# Patient Record
Sex: Male | Born: 1940 | Race: White | Hispanic: No | Marital: Married | Smoking: Never smoker
Health system: Southern US, Community
[De-identification: ages and names within clinical notes are randomized; demographics above are authoritative.]

## PROBLEM LIST (undated history)

## (undated) DIAGNOSIS — K219 Gastro-esophageal reflux disease without esophagitis: Secondary | ICD-10-CM

## (undated) DIAGNOSIS — F0390 Unspecified dementia without behavioral disturbance: Secondary | ICD-10-CM

## (undated) DIAGNOSIS — G20 Parkinson's disease: Secondary | ICD-10-CM

## (undated) DIAGNOSIS — N400 Enlarged prostate without lower urinary tract symptoms: Secondary | ICD-10-CM

## (undated) DIAGNOSIS — G20A1 Parkinson's disease without dyskinesia, without mention of fluctuations: Secondary | ICD-10-CM

## (undated) HISTORY — PX: TONSILLECTOMY: SUR1361

---

## 2009-02-18 ENCOUNTER — Emergency Department: Payer: Self-pay | Admitting: Emergency Medicine

## 2009-02-19 ENCOUNTER — Ambulatory Visit: Payer: Self-pay | Admitting: Neurology

## 2014-12-01 ENCOUNTER — Ambulatory Visit: Payer: Self-pay | Admitting: Gastroenterology

## 2017-09-20 ENCOUNTER — Other Ambulatory Visit: Payer: Self-pay | Admitting: Ophthalmology

## 2017-09-20 DIAGNOSIS — H532 Diplopia: Secondary | ICD-10-CM

## 2017-09-25 ENCOUNTER — Ambulatory Visit
Admission: RE | Admit: 2017-09-25 | Discharge: 2017-09-25 | Disposition: A | Discharge status: Home / Self Care | Payer: Medicare Other | Source: Ambulatory Visit | Attending: Ophthalmology | Admitting: Ophthalmology

## 2017-09-25 DIAGNOSIS — H532 Diplopia: Secondary | ICD-10-CM | POA: Insufficient documentation

## 2017-09-25 MED ORDER — GADOBENATE DIMEGLUMINE 529 MG/ML IV SOLN
15.0000 mL | Freq: Once | INTRAVENOUS | Status: AC | PRN
  Administered 2017-09-25: 14 mL via INTRAVENOUS

## 2019-07-24 ENCOUNTER — Encounter: Payer: Self-pay | Admitting: Speech Pathology

## 2019-07-24 ENCOUNTER — Ambulatory Visit: Payer: Medicare Other | Attending: Neurology | Admitting: Speech Pathology

## 2019-07-24 ENCOUNTER — Other Ambulatory Visit: Payer: Self-pay

## 2019-07-24 DIAGNOSIS — R490 Dysphonia: Secondary | ICD-10-CM | POA: Diagnosis present

## 2019-07-24 DIAGNOSIS — M542 Cervicalgia: Secondary | ICD-10-CM | POA: Insufficient documentation

## 2019-07-24 DIAGNOSIS — M6281 Muscle weakness (generalized): Secondary | ICD-10-CM | POA: Insufficient documentation

## 2019-07-24 DIAGNOSIS — G8929 Other chronic pain: Secondary | ICD-10-CM | POA: Diagnosis present

## 2019-07-24 NOTE — Therapy (Signed)
[place] [facility] MAIN [facility] SERVICES [address] [place], [place], [address] Phone: [phone]   Fax:  [phone]  Speech Language Pathology Evaluation  Patient Details  Name: Joshua George MRN: [id number] Date of Birth: Feb 05, 1941 Referring Provider (SLP): Dr. Sherryll Burger   Encounter Date: 07/24/2019  End of Session - 07/24/19 1058    Visit Number  1    Number of Visits  17    Date for SLP Re-Evaluation  09/18/19    Authorization Type  Medicare    Authorization Time Period  Start 07/24/2019    Authorization - Visit Number  1    Authorization - Number of Visits  10    SLP Start Time  0900    SLP Stop Time   0950    SLP Time Calculation (min)  50 min    Activity Tolerance  Patient tolerated treatment well       History reviewed. No pertinent past medical history.  History reviewed. No pertinent surgical history.  There were no vitals filed for this visit.      SLP Evaluation [facility] - 07/24/19 0001      SLP Visit Information   SLP Received On  07/24/19    Referring Provider (SLP)  Dr. Sherryll Burger    Onset Date  06/12/2019    Medical Diagnosis  Parkinson's disease      Subjective   Subjective  Patient reports that he has been hoarse since the Parkinson's disease was diagnosed and that many people indicate that he is not loud enough.    Patient/Family Stated Goal  To be loud and clear when speaking      General Information   HPI  78 year old man diagnosed with Parkinson's disease with subsequent dysphonia and hypophonia      Prior Functional Status   Cognitive/Linguistic Baseline  Within functional limits      Oral Motor/Sensory Function   Overall Oral Motor/Sensory Function  Appears within functional limits for tasks assessed      Motor Speech   Overall Motor Speech  Impaired    Respiration  Impaired    Level of Impairment  Sentence    Phonation  Low vocal intensity;Hoarse    Resonance  Within functional limits    Articulation  Within  functional limitis    Intelligibility  Intelligible    Phonation  Impaired    Vocal Abuses  Habitual Hyperphonia    Tension Present  Jaw;Neck;Shoulder    Volume  Soft    Pitch  Low      Standardized Assessments   Standardized Assessments   Other Assessment   Perceptual Voice Evaluation      Perceptual Voice Evaluation Maximum phonation time for sustained "ah": 5 seconds Mean intensity during sustained "ah": 70 dB  Mean intensity sustained during conversational speech: 65 dB Habitual pitch: 106 Hz Highest dynamic pitch when altering pitch from a low note to a high note: 254 Hz Highest pitch during conversational speech: 381 Hz Lowest dynamic pitch when altering from a high note to a low note: 95 Hz Lowest pitch during conversational speech: 79 Hz Average fundamental frequency during sustained "ah": 107 Hz (1.6 below STD for age and gender) Visi-Pitch: Pharmacist, community (MDVP)  MDVPT extracts objective quantitative values (Relative Average Perturbation, Shimmer, Voice Turbulence Index, and Noise to Harmonic Ratio) on sustained phonation, which are displayed graphically and numerically in comparison to a built-in normative database.  The patient exhibited values outside the norm for Relative Average Perturbation  and Shimmer.  Average fundamental frequency was 1.6 below STD age and gender.  Simulatability: Improved vocal quality with trial flow phonation therapy.  SLP Education - 07/24/19 1057    Education Details  Results and recommendations    Person(s) Educated  Patient    Methods  Explanation    Comprehension  Verbalized understanding         SLP Long Term Goals - 07/24/19 1101      SLP LONG TERM GOAL #1   Title  The patient will demonstrate independent understanding of vocal hygiene concepts and extrinsic laryngeal muscle stretches.    Time  8    Period  Weeks    Status  New    Target Date  09/18/19      SLP LONG TERM GOAL #2   Title  The patient will be  independent for abdominal breathing and breath support exercises.    Time  8    Period  Weeks    Status  New    Target Date  09/18/19      SLP LONG TERM GOAL #3   Title  The patient will maximize voice quality and loudness using breath support/oral resonance for sustained vowel production, pitch glides, and hierarchal speech drill.    Time  8    Period  Weeks    Status  New    Target Date  09/18/19      SLP LONG TERM GOAL #4   Title  The patient will maximize voice quality and loudness using breath support/oral resonance for paragraph length recitation with 80% accuracy.    Time  8    Period  Weeks    Status  New    Target Date  09/18/19       Plan - 07/24/19 1100    Clinical Impression Statement  This 78 year old man with Parkinson's disease is presenting with moderate dysphonia.  The patient demonstrates hoarse vocal quality, reduced breath control for speech, excessive pharyngeal resonance, strained/tense phonation, limited pitch range, vocal fatigue, and laryngeal tension. He will benefit from voice therapy for education, to improve breath support, improve tone focus, promote easy flow phonation, and learn techniques to increase loudness and pitch range without strain.    Speech Therapy Frequency  2x / week    Duration  Other (comment)   8 weeks   Treatment/Interventions  Other (comment);Patient/family education   Voice therapy   Potential to Achieve Goals  Good    Potential Considerations  Ability to learn/carryover information;Previous level of function;Co-morbidities;Severity of impairments;Cooperation/participation level;Medical prognosis;Family/community support    SLP Home Exercise Plan  Provided    Consulted and Agree with Plan of Care  Patient       Patient will benefit from skilled therapeutic intervention in order to improve the following deficits and impairments:   Dysphonia - Plan: SLP plan of care cert/re-cert    Problem List There are no active problems to  display for this patient.  Dollene Primrose, MS/CCC- SLP  Leandrew Koyanagi 07/24/2019, 11:07 AM  [place] [facility] MAIN [facility] SERVICES [address] [place], [place], [address] Phone: [phone]   Fax:  [phone]  Name: Aayden Salvati MRN: [id number] Date of Birth: 1940-11-23

## 2019-07-26 ENCOUNTER — Other Ambulatory Visit: Payer: Self-pay

## 2019-07-26 ENCOUNTER — Ambulatory Visit: Payer: Medicare Other | Admitting: Speech Pathology

## 2019-07-26 DIAGNOSIS — R490 Dysphonia: Secondary | ICD-10-CM | POA: Diagnosis not present

## 2019-07-26 NOTE — Therapy (Signed)
[place] [facility] MAIN [facility] SERVICES [address] [place], [place], [address] Phone: [phone]   Fax:  [phone]  Speech Language Pathology Treatment  Patient Details  Name: Joshua George MRN: [id number] Date of Birth: Aug 13, 1941 Referring Provider (SLP): Dr. Sherryll Burger   Encounter Date: 07/26/2019  End of Session - 07/26/19 1551    Visit Number  1    Number of Visits  17    Date for SLP Re-Evaluation  09/18/19    Authorization Type  Medicare    Authorization Time Period  Start 07/24/2019    Authorization - Visit Number  2    Authorization - Number of Visits  10    SLP Start Time  0800    SLP Stop Time   0845    SLP Time Calculation (min)  45 min    Activity Tolerance  Patient tolerated treatment well       No past medical history on file.  No past surgical history on file.  There were no vitals filed for this visit.  Subjective Assessment - 07/26/19 1551    Subjective  The patient reports that he is doing the exercises provided at the evaluation            ADULT SLP TREATMENT - 07/26/19 0001      General Information   Behavior/Cognition  Alert;Cooperative;Pleasant mood    HPI  78 year old man diagnosed with Parkinson's disease with subsequent dysphonia and hypophonia       Treatment Provided   Treatment provided  Cognitive-Linquistic      Pain Assessment   Pain Assessment  No/denies pain      Cognitive-Linquistic Treatment   Treatment focused on  Voice    Skilled Treatment  Flow Phonation Skill Level One: establish airflow release: Unarticulated: Patient able to generate multiple samples of unarticulated airflow without stoppage due to tension.  Articulated: Patient had difficulty maintaining airflow with unvoiced articulated airflow.  By the end of this portion of the session, the patient was able to maintain articulated airflow for up to 7 syllables (inconsistent).  Skill Level Two: Airflow + Voicing: Unarticulated:  Patient  had inconsistent success with unarticulated voiced sounds, best response to gently turning on voice while transitioning from voiceless fricative to voiced fricative.      Assessment / Recommendations / Plan   Plan  Continue with current plan of care      Progression Toward Goals   Progression toward goals  Progressing toward goals       SLP Education - 07/26/19 1551    Education Details  flow phonation    Person(s) Educated  Patient    Methods  Explanation;Demonstration;Handout    Comprehension  Verbalized understanding;Need further instruction         SLP Long Term Goals - 07/24/19 1101      SLP LONG TERM GOAL #1   Title  The patient will demonstrate independent understanding of vocal hygiene concepts and extrinsic laryngeal muscle stretches.    Time  8    Period  Weeks    Status  New    Target Date  09/18/19      SLP LONG TERM GOAL #2   Title  The patient will be independent for abdominal breathing and breath support exercises.    Time  8    Period  Weeks    Status  New    Target Date  09/18/19      SLP LONG TERM GOAL #3  Title  The patient will maximize voice quality and loudness using breath support/oral resonance for sustained vowel production, pitch glides, and hierarchal speech drill.    Time  8    Period  Weeks    Status  New    Target Date  09/18/19      SLP LONG TERM GOAL #4   Title  The patient will maximize voice quality and loudness using breath support/oral resonance for paragraph length recitation with 80% accuracy.    Time  8    Period  Weeks    Status  New    Target Date  09/18/19       Plan - 07/26/19 1552    Clinical Impression Statement  The patient demonstrates understanding of flow phonation with good mastery of unarticulated/unvoiced airflow and emerging mastery of articulated/unvoiced and unarticulated/voiced sounds.    Speech Therapy Frequency  2x / week    Duration  Other (comment)    Treatment/Interventions  Other  (comment);Patient/family education    Potential Considerations  Ability to learn/carryover information;Previous level of function;Co-morbidities;Severity of impairments;Cooperation/participation level;Medical prognosis;Family/community support    SLP Home Exercise Plan  Provided    Consulted and Agree with Plan of Care  Patient       Patient will benefit from skilled therapeutic intervention in order to improve the following deficits and impairments:   Dysphonia    Problem List There are no active problems to display for this patient.  [name], MS/CCC- SLP  [name] 07/26/2019, 3:53 PM  [place] MAIN [facility] SERVICES [address] [place], [place], [address] Phone: [phone]   Fax:  [phone]   Name: Nikita Humble MRN: [id number] Date of Birth: 02-18-1941

## 2019-07-30 ENCOUNTER — Ambulatory Visit: Payer: Medicare Other | Admitting: Physical Therapy

## 2019-07-30 ENCOUNTER — Other Ambulatory Visit: Payer: Self-pay

## 2019-07-30 ENCOUNTER — Encounter: Payer: Self-pay | Admitting: Physical Therapy

## 2019-07-30 DIAGNOSIS — R490 Dysphonia: Secondary | ICD-10-CM | POA: Diagnosis not present

## 2019-07-30 DIAGNOSIS — M6281 Muscle weakness (generalized): Secondary | ICD-10-CM

## 2019-07-30 DIAGNOSIS — G8929 Other chronic pain: Secondary | ICD-10-CM

## 2019-07-30 NOTE — Patient Instructions (Addendum)
Flexion: Cervical OA - Supine    Lie with bolster under mid neck, fingertips under chin. Pull chin in then lower it, rolling head forward on neck. Do not lift neck off bolster. Repeat _10___ times per set. Do __2__ sets per session. Do ___7_ sessions per week.  Copyright  VHI. All rights reserved.  SCAPULA: Retraction    Hold cane with both hands. Pinch shoulder blades together. Do not shrug shoulders. Hold _5__ seconds.  __10_ reps per set, _2__ sets per day, __7_ days per week  Copyright  VHI. All rights reserved.

## 2019-07-30 NOTE — Therapy (Signed)
[place] [facility] MAIN [facility] SERVICES [address] [place], [place], [id number] Phone: [phone]   Fax:  [phone]  Physical Therapy Evaluation  Patient Details  Name: Joshua George MRN: [id number] Date of Birth: 02/28/1941 Referring Provider (PT): Cristopher PeruShah, Hemang   Encounter Date: 07/30/2019  PT End of Session - 07/30/19 0827    Visit Number  1    Number of Visits  17    Date for PT Re-Evaluation  09/30/19    PT Start Time  0801    PT Stop Time  0900    PT Time Calculation (min)  59 min    Equipment Utilized During Treatment  Gait belt    Activity Tolerance  Patient tolerated treatment well;No increased pain       History reviewed. No pertinent past medical history.  History reviewed. No pertinent surgical history.  There were no vitals filed for this visit.   Subjective Assessment - 07/30/19 0804    Subjective  Patient reports that he does not walk as well as he did before he had the parkinsons.Patient has poor posture and has neck flexion in sitting and fwd head. He has neck pain intermittently. His pain ranges from 0/10-3-4/10.    Pertinent History  Patient has low back pain for over a year that is constant. He does not ambulate with a device. He has had neck pain that is intermittent for over a year and ranges from 0/10 to 4/10.    Currently in Pain?  Yes    Pain Score  6     Pain Location  Back    Pain Orientation  Posterior;Lower    Pain Descriptors / Indicators  Aching    Pain Frequency  Constant    Multiple Pain Sites  Yes   he has intermittent neck pain , currenttly no neck pain        [facility] PT Assessment - 07/30/19 0808      Assessment   Medical Diagnosis  parkinsons    Referring Provider (PT)  Sherryll BurgerShah, Hemang    Onset Date/Surgical Date  07/24/19    Hand Dominance  Right    Prior Therapy  no      Precautions   Precautions  None      Restrictions   Weight Bearing Restrictions  No      Balance Screen   Has the  patient fallen in the past 6 months  No    Has the patient had a decrease in activity level because of a fear of falling?   Yes    Is the patient reluctant to leave their home because of a fear of falling?   No      Home Nurse, mental healthnvironment   Living Environment  Private residence    Living Arrangements  Spouse/significant other    Available Help at Discharge  Family    Type of Home  House    Home Access  Ramped entrance    Home Layout  One level    Home Equipment  Walker - 2 wheels;Gilmer MorCane - single point      Prior Function   Level of Independence  Independent with household mobility without device    Vocation  Retired    Leisure  work in the garden      Cognition   Overall Cognitive Status  Within Functional Limits for tasks assessed        PAIN: Neck pain intermittent pain 0/10-4/10  Palpation reveals no tenderness in  cervical or thoracic musculature  POSTURE:fwd head, flexed neck, kyphosis    PROM/AROM: Cervical rotation left 40 deg, right 40 deg Neck SBR= 30 deg SBL = 10 Neck extension -10 deg   Cervical strength: Extension 2+/5 Rotation left and right 3/5 SB L and R -3/5        SENSATION:WNL BUE   FUNCTIONAL MOBILITY: Patient is not able to get fully into prone position due to 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              PT Education - 07/30/19 0826    Education Details  plan of care    Person(s) Educated  Patient     Methods  Explanation    Comprehension  Verbalized understanding       PT Short Term Goals - 07/30/19 0902      PT SHORT TERM GOAL #1   Title  Patient will be independent in home exercise program to improve strength/mobility for better functional independence with ADLs.    Time  4    Period  Weeks    Status  New    Target Date  09/02/19        PT Long Term Goals - 07/30/19 0903      PT LONG TERM GOAL #1   Title  Patient will increase BLE gross strength to 4+/5 as to improve functional strength for independent gait, increased standing tolerance and increased ADL ability.    Time  8    Period  Weeks    Status  New    Target Date  09/24/19      PT LONG TERM GOAL #2   Title  Patient will be able to perform household work/ chores without increase in symptoms.    Time  8    Period  Weeks    Status  New    Target Date  09/24/19      PT LONG TERM GOAL #3   Title  Patient will report a worst pain of 2/10 on VAS in   neck          to improve tolerance with ADLs and reduced symptoms with activities.    Time  8    Status  New    Target Date  09/24/19             Plan - 07/30/19 7510    Clinical Impression Statement Pt is a pleasant patient with c/o intermittent neck pain, balance difficulty, and muscle weakness impairing ADL's.  Pt currently presents with significant posture and cervical ROM and strength deficits.  Pt would benefit from continued skilled PT to address these deficits and improve posture.    Personal Factors and Comorbidities  Age;Comorbidity 1    Comorbidities  Parkinsons    Examination-Activity Limitations  Caring for Others;Stand;Locomotion Level    Examination-Participation Restrictions  [employer]  Stability/Clinical Decision Making  Stable/Uncomplicated    Clinical Decision Making  Low    Rehab Potential  Fair    PT Frequency  2x / week    PT Duration  8 weeks    PT Treatment/Interventions  Cryotherapy;Electrical Stimulation;Moist  Heat;Traction;Ultrasound;Therapeutic activities;Therapeutic exercise;Patient/family education;Manual techniques;Passive range of motion    PT Next Visit Plan  posture education    PT Home Exercise Plan  chin tucks, scapula retraction    Consulted and Agree with Plan of Care  Patient       Patient will benefit from skilled therapeutic intervention in order to improve the following deficits and impairments:  Pain, Decreased activity tolerance, Decreased endurance, Decreased range of motion, Decreased strength  Visit Diagnosis: Muscle weakness (generalized)  Neck pain, chronic     Problem List There are no active problems to display for this patient.   [address] [place] DPT 07/30/2019, 9:07 AM  [place] [facility] MAIN [facility] SERVICES [address] [place], [place], [address] Phone: [phone]   Fax:  [phone]  Name: Joshua George MRN: [id number] Date of Birth: [birth year]

## 2019-08-01 ENCOUNTER — Encounter: Payer: Self-pay | Admitting: Speech Pathology

## 2019-08-01 ENCOUNTER — Ambulatory Visit: Payer: Medicare Other | Admitting: Speech Pathology

## 2019-08-01 ENCOUNTER — Other Ambulatory Visit: Payer: Self-pay

## 2019-08-01 DIAGNOSIS — R490 Dysphonia: Secondary | ICD-10-CM

## 2019-08-01 NOTE — Therapy (Signed)
[place] MAIN [facility] SERVICES [address] [place], [place], [address] Phone: [phone]   Fax:  [phone]  Speech Language Pathology Treatment  Patient Details  Name: Louie Flenner MRN: [id number] Date of Birth: 09-Oct-1940 Referring Provider (SLP): Dr. Manuella Ghazi   Encounter Date: 08/01/2019  End of Session - 08/01/19 1629    Visit Number  3    Number of Visits  17    Date for SLP Re-Evaluation  09/18/19    Authorization Type  Medicare    Authorization Time Period  Start 07/24/2019    Authorization - Visit Number  3    Authorization - Number of Visits  10    SLP Start Time  1400    SLP Stop Time   1450    SLP Time Calculation (min)  50 min    Activity Tolerance  Patient tolerated treatment well       History reviewed. No pertinent past medical history.  History reviewed. No pertinent surgical history.  There were no vitals filed for this visit.  Subjective Assessment - 08/01/19 1626    Subjective  The patient was alert, cooperative, and pleasant throughout the therapy session. He shared that one of his neck stretches was helpful today after working in the kitchen.              SLP Education - 08/01/19 1628    Education Details  Demonstration of improved positioning for extrinsic laryngeal muscle stretch.    Person(s) Educated  Patient    Methods  Explanation;Demonstration    Comprehension  Verbalized understanding;Returned demonstration         SLP Long Term Goals - 07/24/19 1101      SLP LONG TERM GOAL #1   Title  The patient will demonstrate independent understanding of vocal hygiene concepts and extrinsic laryngeal muscle stretches.    Time  8    Period  Weeks    Status  New    Target Date  09/18/19      SLP LONG TERM GOAL #2   Title  The patient will be independent for abdominal breathing and breath support exercises.    Time  8    Period  Weeks    Status  New    Target Date  09/18/19      SLP LONG TERM  GOAL #3   Title  The patient will maximize voice quality and loudness using breath support/oral resonance for sustained vowel production, pitch glides, and hierarchal speech drill.    Time  8    Period  Weeks    Status  New    Target Date  09/18/19      SLP LONG TERM GOAL #4   Title  The patient will maximize voice quality and loudness using breath support/oral resonance for paragraph length recitation with 80% accuracy.    Time  8    Period  Weeks    Status  New    Target Date  09/18/19       Plan - 08/01/19 1629    Clinical Impression Statement  Patient presents with moderate dysphonia characterized by hoarse vocal quality, reduced breath control for speech, excessive pharyngeal resonance, strained/tense phonation, limited pitch range, vocal fatigue, and laryngeal tension. The patient demonstrates understanding of flow phonation with good mastery of unarticulated/unvoiced airflow and emerging mastery of articulated/unvoiced and unarticulated/voiced sounds. Will continue ST intervention to address dysphonia.    Speech Therapy Frequency  2x / week  Duration  Other (comment)    Treatment/Interventions  Other (comment);Patient/family education    Potential to Achieve Goals  Good    Potential Considerations  Ability to learn/carryover information;Previous level of function;Co-morbidities;Severity of impairments;Cooperation/participation level;Medical prognosis;Family/community support    SLP Home Exercise Plan  Provided    Consulted and Agree with Plan of Care  Patient       Patient will benefit from skilled therapeutic intervention in order to improve the following deficits and impairments:   Dysphonia    Problem List There are no active problems to display for this patient.  Japleen Tornow A. Avelina Laine., Graduate Clinician Leandrew Koyanagi 08/02/2019, 11:21 AM  [place] [facility] MAIN [facility] SERVICES [address] [place], [place], [address] Phone:  [phone]   Fax:  [phone]   Name: Hilman Kissling MRN: [id number] Date of Birth: 1941/05/27

## 2019-08-05 ENCOUNTER — Ambulatory Visit: Payer: Medicare Other | Admitting: Physical Therapy

## 2019-08-05 ENCOUNTER — Other Ambulatory Visit: Payer: Self-pay

## 2019-08-05 ENCOUNTER — Encounter: Payer: Self-pay | Admitting: Physical Therapy

## 2019-08-05 DIAGNOSIS — R490 Dysphonia: Secondary | ICD-10-CM | POA: Diagnosis not present

## 2019-08-05 DIAGNOSIS — M6281 Muscle weakness (generalized): Secondary | ICD-10-CM

## 2019-08-05 DIAGNOSIS — G8929 Other chronic pain: Secondary | ICD-10-CM

## 2019-08-05 DIAGNOSIS — M542 Cervicalgia: Secondary | ICD-10-CM

## 2019-08-05 NOTE — Therapy (Signed)
[place] MAIN [facility] SERVICES [address] [place], [place], [address] Phone: [phone]   Fax:  [phone]  Physical Therapy Treatment  Patient Details  Name: Joshua George MRN: [id number] Date of Birth: Apr 11, 1941 Referring Provider (PT): Jennings Books   Encounter Date: 08/05/2019  PT End of Session - 08/05/19 0810    Visit Number  2    Number of Visits  17    Date for PT Re-Evaluation  09/30/19    PT Start Time  0805    PT Stop Time  2119    PT Time Calculation (min)  39 min    Equipment Utilized During Treatment  Gait belt    Activity Tolerance  Patient tolerated treatment well;No increased pain       History reviewed. No pertinent past medical history.  History reviewed. No pertinent surgical history.  There were no vitals filed for this visit.  Subjective Assessment - 08/05/19 0809    Subjective  Patient says that he wants to work on his posture.    Pertinent History  Patient has low back pain for over a year that is constant. He does not ambulate with a device. He has had neck pain that is intermittent for over a year and ranges from 0/10 to 4/10.      Treatment UBE fwd x 5 mins, bwd x 5 mins Scapula retraction RTB x 20 x 2 Chin tucks with 5 sec hold sitting  Matrix scapula retraction x 20 x 2  Protraction with RTB x 20 x 2  Over the door with green ball x 10  Supine protraction with cane and 2 lbs x 20 x 2  Patient performed with instruction, verbal cues, tactile cues of therapist: goal: increase tissue extensibility, promote proper posture, improve mobility                         PT Education - 08/05/19 0809    Education Details  HEP    Person(s) Educated  Patient    Methods  Explanation    Comprehension  Verbalized understanding;Need further instruction;Returned demonstration;Verbal cues required;Tactile cues required       PT Short Term Goals - 07/30/19 0902      PT SHORT TERM GOAL #1    Title  Patient will be independent in home exercise program to improve strength/mobility for better functional independence with ADLs.    Time  4    Period  Weeks    Status  New    Target Date  09/02/19        PT Long Term Goals - 07/30/19 0903      PT LONG TERM GOAL #1   Title  Patient will increase BLE gross strength to 4+/5 as to improve functional strength for independent gait, increased standing tolerance and increased ADL ability.    Time  8    Period  Weeks    Status  New    Target Date  09/24/19      PT LONG TERM GOAL #2   Title  Patient will be able to perform household work/ chores without increase in symptoms.    Time  8    Period  Weeks    Status  New    Target Date  09/24/19      PT LONG TERM GOAL #3   Title  Patient will report a worst pain of 2/10 on VAS in   neck  to improve tolerance with ADLs and reduced symptoms with activities.    Time  8    Status  New    Target Date  09/24/19            Plan - 08/05/19 0810    Clinical Impression Statement  Patient presents with no neck/cervical pain today. He performs upper thoracic and cervical strengthening exercises to improve posture. He performs exercises without pain behaviors and needs cues and instruction for correct technique. Patient will continue to benefit from skilled PT to improve posture and strength.    Personal Factors and Comorbidities  Age;Comorbidity 1    Comorbidities  Parkinsons    Examination-Activity Limitations  Caring for Others;Stand;Locomotion Level    Examination-Participation Restrictions  Yard Work;Driving    Stability/Clinical Decision Making  Stable/Uncomplicated    Rehab Potential  Fair    PT Frequency  2x / week    PT Duration  8 weeks    PT Treatment/Interventions  Cryotherapy;Electrical Stimulation;Moist Heat;Traction;Ultrasound;Therapeutic activities;Therapeutic exercise;Patient/family education;Manual techniques;Passive range of motion    PT Next Visit Plan   posture education    PT Home Exercise Plan  chin tucks, scapula retraction    Consulted and Agree with Plan of Care  Patient       Patient will benefit from skilled therapeutic intervention in order to improve the following deficits and impairments:  Pain, Decreased activity tolerance, Decreased endurance, Decreased range of motion, Decreased strength  Visit Diagnosis: Muscle weakness (generalized)  Neck pain, chronic     Problem List There are no active problems to display for this patient.   [address],  DPT 08/05/2019, 8:16 AM   [facility] MAIN [facility] SERVICES [address] [place], [place], [address] Phone: [phone]   Fax:  [phone]  Name: Joshua George MRN: [id number] Date of Birth: 06-12-1941

## 2019-08-06 ENCOUNTER — Ambulatory Visit: Payer: Medicare Other | Admitting: Speech Pathology

## 2019-08-06 ENCOUNTER — Ambulatory Visit: Payer: Medicare Other | Admitting: Physical Therapy

## 2019-08-06 ENCOUNTER — Encounter: Payer: Self-pay | Admitting: Speech Pathology

## 2019-08-06 ENCOUNTER — Encounter: Payer: Self-pay | Admitting: Physical Therapy

## 2019-08-06 ENCOUNTER — Other Ambulatory Visit: Payer: Self-pay

## 2019-08-06 DIAGNOSIS — G8929 Other chronic pain: Secondary | ICD-10-CM

## 2019-08-06 DIAGNOSIS — M6281 Muscle weakness (generalized): Secondary | ICD-10-CM

## 2019-08-06 DIAGNOSIS — R490 Dysphonia: Secondary | ICD-10-CM | POA: Diagnosis not present

## 2019-08-06 NOTE — Therapy (Signed)
[place] [facility] MAIN [facility] SERVICES [address] [place], [place], [address] Phone: [phone]   Fax:  [phone]  Physical Therapy Treatment  Patient Details  Name: Joshua George MRN: [id number] Date of Birth: 12/15/1940 Referring Provider (PT): Cristopher Peru   Encounter Date: 08/06/2019  PT End of Session - 08/06/19 0811    Visit Number  3    Number of Visits  17    Date for PT Re-Evaluation  09/30/19    PT Start Time  0805    PT Stop Time  0845    PT Time Calculation (min)  40 min    Equipment Utilized During Treatment  Gait belt    Activity Tolerance  Patient tolerated treatment well;No increased pain       History reviewed. No pertinent past medical history.  History reviewed. No pertinent surgical history.  There were no vitals filed for this visit.  Subjective Assessment - 08/06/19 0809    Subjective  Patient says that he wants to work on his posture.    Pertinent History  Patient has low back pain for over a year that is constant. He does not ambulate with a device. He has had neck pain that is intermittent for over a year and ranges from 0/10 to 4/10.    Currently in Pain?  Yes    Pain Score  1     Pain Location  Shoulder    Pain Orientation  Left    Pain Descriptors / Indicators  Aching    Pain Type  Acute pain    Pain Onset  Today    Pain Frequency  Rarely    Aggravating Factors   exercise    Pain Relieving Factors  rest    Effect of Pain on Daily Activities  no change    Multiple Pain Sites  No          Treatment UBE fwd x 5 mins, bwd x 5 mins Scapula retraction RTB x 20 x 2 Chin tucks with 5 sec hold sitting              Chin tucks with 5 sec hold, supine Matrix scapula retraction x 20 x 2  Protraction with RTB x 20 x 2  Over the door with green ball x 10  Supine protraction with cane and 2 lbs x 20 x 2  Seated protraction with RTB x 20  Matrix protraction with plate #1  Wall walking with RTB diagonal ladder  up the wall Supine cane horizontal abd/add x 20  Instructed in posture correction with keeping shoulders back and avoiding the head fwd position during the day . Patient performed with instruction, verbal cues, tactile cues of therapist: goal:increase tissue extensibility, promote proper posture, improve mobility                         PT Education - 08/06/19 0810    Education Details  HEP    Person(s) Educated  Patient    Methods  Explanation;Demonstration;Tactile cues;Verbal cues    Comprehension  Verbalized understanding;Returned demonstration;Verbal cues required;Tactile cues required;Need further instruction       PT Short Term Goals - 07/30/19 0902      PT SHORT TERM GOAL #1   Title  Patient will be independent in home exercise program to improve strength/mobility for better functional independence with ADLs.    Time  4    Period  Weeks  Status  New    Target Date  09/02/19        PT Long Term Goals - 07/30/19 0903      PT LONG TERM GOAL #1   Title  Patient will increase BLE gross strength to 4+/5 as to improve functional strength for independent gait, increased standing tolerance and increased ADL ability.    Time  8    Period  Weeks    Status  New    Target Date  09/24/19      PT LONG TERM GOAL #2   Title  Patient will be able to perform household work/ chores without increase in symptoms.    Time  8    Period  Weeks    Status  New    Target Date  09/24/19      PT LONG TERM GOAL #3   Title  Patient will report a worst pain of 2/10 on VAS in   neck          to improve tolerance with ADLs and reduced symptoms with activities.    Time  8    Status  New    Target Date  09/24/19            Plan - 08/06/19 4967    Clinical Impression Statement  Patient presents with no neck/cervical pain today. He performs upper thoracic and cervical strengthening exercises to improve posture. He performs exercises without pain behaviors and needs  cues and instruction for correct technique. Patient will continue to benefit from skilled PT to improve posture and strength    Personal Factors and Comorbidities  Age;Comorbidity 1    Comorbidities  Parkinsons    Examination-Activity Limitations  Caring for Others;Stand;Locomotion Level    Examination-Participation Restrictions  Yard Work;Driving    Stability/Clinical Decision Making  Stable/Uncomplicated    Rehab Potential  Fair    PT Frequency  2x / week    PT Duration  8 weeks    PT Treatment/Interventions  Cryotherapy;Electrical Stimulation;Moist Heat;Traction;Ultrasound;Therapeutic activities;Therapeutic exercise;Patient/family education;Manual techniques;Passive range of motion    PT Next Visit Plan  posture education    PT Home Exercise Plan  chin tucks, scapula retraction    Consulted and Agree with Plan of Care  Patient       Patient will benefit from skilled therapeutic intervention in order to improve the following deficits and impairments:  Pain, Decreased activity tolerance, Decreased endurance, Decreased range of motion, Decreased strength  Visit Diagnosis: Muscle weakness (generalized)  Neck pain, chronic     Problem List There are no active problems to display for this patient.   [address] [place] DPT 08/06/2019, 8:16 AM  [place] MAIN [facility] SERVICES [place], [place], [address] Phone: [phone]   Fax:  [phone]  Name: Joshua George MRN: [id number] Date of Birth: Jan 06, 1941

## 2019-08-06 NOTE — Therapy (Signed)
[place] [facility] MAIN [facility] SERVICES [address] [place], [place], [address] Phone: [phone]   Fax:  [phone]  Speech Language Pathology Treatment  Patient Details  Name: Joshua George MRN: [id number] Date of Birth: 11/16/1940 Referring Provider (SLP): Dr. Sherryll Burger   Encounter Date: 08/06/2019  End of Session - 08/06/19 1115    Visit Number  4    Number of Visits  17    Date for SLP Re-Evaluation  09/18/19    Authorization Type  Medicare    Authorization Time Period  Start 07/24/2019    Authorization - Visit Number  4    Authorization - Number of Visits  10    SLP Start Time  0900    SLP Stop Time   0950    SLP Time Calculation (min)  50 min    Activity Tolerance  Patient tolerated treatment well       History reviewed. No pertinent past medical history.  History reviewed. No pertinent surgical history.  There were no vitals filed for this visit.  Subjective Assessment - 08/06/19 1113    Subjective  The patient was alert, cooperative, and pleasant throughout the therapy session. He discussed his and his wife's plans for Thanksgiving.            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 Patient demonstrated abdominal breathing independently. Patient able to improve posture given minimal prompting and verbalized understanding that it allows more space in the body for breath support. Flow Phonation, Skill Level One - Establish airflow release  (Unarticulated): Patient able to generate multiple samples of unarticulated airflow without stoppage due to tension. Establish airflow release (Articulated): Patient able to maintain airflow and coordinate breath control with exaggerated articulatory movements during unvoiced articulated airflow for up to 8 syllables, given modeling and cueing. Skill Level Two - Airflow + Voicing (Unarticulated): Patient able to transition from voiceless to voiced fricative cognates, given a model to imitate. Patient able to identify location of articulatory constrictions required for production of fricative cognate pairs independently. Airflow + Voicing (Articulated): Patient able to maintain airflow and coordinate breath control with articulatory movements during voiced articulated airflow for up to 7 syllables (inconsistent), given modeling and cueing. Patient completed negative practice of Finger Phonation and independently discriminated "the sound getting stuck in his throat" vs. "flowing through his mouth and lips" as a preliminary introduction to Skill Level Three - Discrimination.      Assessment / Recommendations / Plan   Plan  Continue with current plan of care      Progression Toward Goals   Progression toward goals  Progressing toward goals       SLP Education - 08/06/19 1113    Education Details  Get in the habit of noticing when you are slumped over and coiled up. Sit up as tall and straight as you can to make space in your body for more breath support.    Person(s) Educated  Patient  Methods  Explanation;Demonstration    Comprehension  Verbalized understanding;Returned demonstration;Verbal cues required         SLP Long Term Goals - 07/24/19 1101      SLP LONG TERM GOAL #1   Title  The patient will demonstrate independent understanding of vocal hygiene concepts and extrinsic laryngeal muscle stretches.    Time  8    Period  Weeks    Status  New    Target Date  09/18/19      SLP LONG TERM  GOAL #2   Title  The patient will be independent for abdominal breathing and breath support exercises.    Time  8    Period  Weeks    Status  New    Target Date  09/18/19      SLP LONG TERM GOAL #3   Title  The patient will maximize voice quality and loudness using breath support/oral resonance for sustained vowel production, pitch glides, and hierarchal speech drill.    Time  8    Period  Weeks    Status  New    Target Date  09/18/19      SLP LONG TERM GOAL #4   Title  The patient will maximize voice quality and loudness using breath support/oral resonance for paragraph length recitation with 80% accuracy.    Time  8    Period  Weeks    Status  New    Target Date  09/18/19       Plan - 08/06/19 1115    Clinical Impression Statement  Patient presents with moderate dysphonia characterized by hoarse vocal quality, reduced breath control for speech, excessive pharyngeal resonance, strained/tense phonation, limited pitch range, vocal fatigue, and laryngeal tension. The patient demonstrates understanding of flow phonation with good mastery of unarticulated/unvoiced airflow and emerging mastery of articulated/unvoiced, unarticulated/voiced sounds, and articulated/voiced sounds. Patient is responsive to verbal cueing for improved posture. Will continue ST intervention to address dysphonia.    Speech Therapy Frequency  2x / week    Duration  Other (comment)    Treatment/Interventions  Other (comment);Patient/family education    Potential to Achieve Goals  Good    Potential Considerations  Ability to learn/carryover information;Previous level of function;Co-morbidities;Severity of impairments;Cooperation/participation level;Medical prognosis;Family/community support    SLP Home Exercise Plan  Provided    Consulted and Agree with Plan of Care  Patient       Patient will benefit from skilled therapeutic intervention in order to improve the following deficits and impairments:    Dysphonia    Problem List There are no active problems to display for this patient.  Jazlen Ogarro A. Francis Dowse., Graduate Clinician Vella Kohler 08/06/2019, 11:16 AM  [place] MAIN [facility] SERVICES [address] [place], [place], [address] Phone: [phone]   Fax:  [phone]   Name: Joshua George MRN: [id number] Date of Birth: [birth year]

## 2019-08-13 ENCOUNTER — Ambulatory Visit: Payer: Medicare Other | Admitting: Physical Therapy

## 2019-08-13 ENCOUNTER — Ambulatory Visit: Payer: Medicare Other | Attending: Neurology | Admitting: Speech Pathology

## 2019-08-13 ENCOUNTER — Encounter: Payer: Self-pay | Admitting: Physical Therapy

## 2019-08-13 ENCOUNTER — Other Ambulatory Visit: Payer: Self-pay

## 2019-08-13 ENCOUNTER — Encounter: Payer: Self-pay | Admitting: Speech Pathology

## 2019-08-13 DIAGNOSIS — G8929 Other chronic pain: Secondary | ICD-10-CM

## 2019-08-13 DIAGNOSIS — M6281 Muscle weakness (generalized): Secondary | ICD-10-CM | POA: Insufficient documentation

## 2019-08-13 DIAGNOSIS — R490 Dysphonia: Secondary | ICD-10-CM | POA: Diagnosis present

## 2019-08-13 DIAGNOSIS — M542 Cervicalgia: Secondary | ICD-10-CM | POA: Diagnosis present

## 2019-08-13 NOTE — Therapy (Signed)
[place] MAIN [facility] SERVICES [address] [place], [place], [address] Phone: [phone]   Fax:  [phone]  Physical Therapy Treatment  Patient Details  Name: Joshua George MRN: [id number] Date of Birth: [birth year] Referring Provider (PT): Jennings Books   Encounter Date: 08/13/2019  PT End of Session - 08/13/19 1332    Visit Number  4    Number of Visits  17    Date for PT Re-Evaluation  09/30/19    PT Start Time  0105    PT Stop Time  0145    PT Time Calculation (min)  40 min    Equipment Utilized During Treatment  Gait belt    Activity Tolerance  Patient tolerated treatment well;No increased pain       History reviewed. No pertinent past medical history.  History reviewed. No pertinent surgical history.  There were no vitals filed for this visit.  Subjective Assessment - 08/13/19 1322    Subjective  Patient says that he wants to work on his posture. No new conplaints, no pain.    Pertinent History  Patient has low back pain for over a year that is constant. He does not ambulate with a device. He has had neck pain that is intermittent for over a year and ranges from 0/10 to 4/10.    Currently in Pain?  No/denies    Pain Score  0-No pain    Pain Onset  Today       Treatment UBE fwd x 5 mins, bwd x 5 mins Scapula retraction RTB x 20 x 2 Chin tucks with 5 sec hold sitting              Chin tucks with 5 sec hold, supine Matrix scapula retraction x 20 x 2  Protraction with RTB x 20 x 2 Over the door with green ball x 10 Supine protraction with cane and 2 lbs x 20 x 2 Seated protraction with RTB x 20  Matrix protraction with plate #1  Wall walking with RTB diagonal ladder up the wall Supine cane horizontal abd/add x 20  Instructed in posture correction with keeping shoulders back and avoiding the head fwd position during the day . Patient performed with instruction, verbal cues, tactile cues of therapist: goal:increase  tissue extensibility, promote proper posture, improve mobility                        PT Education - 08/13/19 1323    Education Details  HEP    Person(s) Educated  Patient    Methods  Explanation    Comprehension  Verbalized understanding;Returned demonstration;Verbal cues required;Tactile cues required;Need further instruction       PT Short Term Goals - 07/30/19 0902      PT SHORT TERM GOAL #1   Title  Patient will be independent in home exercise program to improve strength/mobility for better functional independence with ADLs.    Time  4    Period  Weeks    Status  New    Target Date  09/02/19        PT Long Term Goals - 07/30/19 0903      PT LONG TERM GOAL #1   Title  Patient will increase BLE gross strength to 4+/5 as to improve functional strength for independent gait, increased standing tolerance and increased ADL ability.    Time  8    Period  Weeks    Status  New    Target Date  09/24/19      PT LONG TERM GOAL #2   Title  Patient will be able to perform household work/ chores without increase in symptoms.    Time  8    Period  Weeks    Status  New    Target Date  09/24/19      PT LONG TERM GOAL #3   Title  Patient will report a worst pain of 2/10 on VAS in   neck          to improve tolerance with ADLs and reduced symptoms with activities.    Time  8    Status  New    Target Date  09/24/19            Plan - 08/13/19 1334    Clinical Impression Statement  Patient instructed in intermediate thoracic and cervical strengthening exercises.  Patient requires min Vcs for correct exercise technique including to improve UE control with standing exercise. Patient demonstrates better posture control with verbal cues. Patient would benefit from additional skilled PT intervention to improve strength and posture..    Personal Factors and Comorbidities  Age;Comorbidity 1    Comorbidities  Parkinsons    Examination-Activity Limitations  Caring for  Others;Stand;Locomotion Level    Examination-Participation Restrictions  Yard Work;Driving    Stability/Clinical Decision Making  Stable/Uncomplicated    Rehab Potential  Fair    PT Frequency  2x / week    PT Duration  8 weeks    PT Treatment/Interventions  Cryotherapy;Electrical Stimulation;Moist Heat;Traction;Ultrasound;Therapeutic activities;Therapeutic exercise;Patient/family education;Manual techniques;Passive range of motion    PT Next Visit Plan  posture education    PT Home Exercise Plan  chin tucks, scapula retraction    Consulted and Agree with Plan of Care  Patient       Patient will benefit from skilled therapeutic intervention in order to improve the following deficits and impairments:  Pain, Decreased activity tolerance, Decreased endurance, Decreased range of motion, Decreased strength  Visit Diagnosis: Muscle weakness (generalized)  Neck pain, chronic     Problem List There are no active problems to display for this patient.   [address] [place] DPT 08/13/2019, 1:36 PM  [place] [facility] MAIN [facility] SERVICES [address] [place], [place], [address] Phone: [phone]   Fax:  [phone]  Name: Joshua George MRN: [id number] Date of Birth: [birth year]

## 2019-08-13 NOTE — Therapy (Signed)
[place] [facility] MAIN [facility] SERVICES [address] [place], [place], [address] Phone: [phone]   Fax:  [phone]  Speech Language Pathology Treatment  Patient Details  Name: Joshua George MRN: [id number] Date of Birth: December 08, 1940 Referring Provider (SLP): Dr. Sherryll Burger   Encounter Date: 08/13/2019  End of Session - 08/13/19 1606    Visit Number  5    Number of Visits  17    Date for SLP Re-Evaluation  09/18/19    Authorization Type  Medicare    Authorization Time Period  Start 07/24/2019    Authorization - Visit Number  5    Authorization - Number of Visits  10    SLP Start Time  1400    SLP Stop Time   1450    SLP Time Calculation (min)  50 min    Activity Tolerance  Patient tolerated treatment well       History reviewed. No pertinent past medical history.  History reviewed. No pertinent surgical history.  There were no vitals filed for this visit.  Subjective Assessment - 08/13/19 1604    Subjective  The patient was alert, cooperative, and pleasant throughout the therapy session. He complained of increased vocal hoarseness triggered by mowing the grass 4 days ago.            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 He reports completing these at least twice a day at home. Patient able to improve posture given minimal prompting and verbalized understanding that it allows more space in the body for breath support. Patient demonstrated abdominal  breathing independently. Flow Phonation, Skill Level One - Establish airflow release (Unarticulated): Patient able to generate multiple samples of unarticulated airflow without stoppage due to tension. Establish airflow release (Articulated): Patient able to maintain airflow and coordinate breath control with exaggerated articulatory movements during unvoiced articulated airflow for up to 8 syllables, given moderate cueing. Skill Level Two - Airflow + Voicing (Unarticulated): Patient able to transition from voiceless to voiced fricative cognates, given moderate cueing. Patient able to identify location of articulatory constrictions required for production of fricative cognate pairs with 100% accuracy independently. Airflow + Voicing (Articulated): Patient able to maintain airflow and coordinate breath control with articulatory movements during voiced articulated airflow for up to 8 syllables (inconsistent), given moderate cueing. Skill Level Three - Discrimination: Patient able to discriminate between flow phonation and pressed phonation in graduate clinician's productions with 100% accuracy independently.       Assessment / Recommendations / Plan   Plan  Continue with current plan of care      Progression Toward Goals   Progression toward goals  Progressing toward goals       SLP Education - 08/13/19 1604    Education Details  Instead of straining to force out your last bit of air at the end, make it a slow, controlled release of the whole air stream.    Person(s) Educated  Patient    Methods  Explanation;Demonstration    Comprehension  Verbalized understanding;Returned demonstration;Verbal cues required;Need further instruction         SLP Long Term Goals - 07/24/19 1101      SLP LONG TERM GOAL #1   Title  The patient will demonstrate independent understanding of vocal hygiene concepts and extrinsic laryngeal muscle stretches.    Time  8    Period  Weeks    Status  New    Target Date   09/18/19      SLP LONG TERM GOAL #2   Title  The patient will be independent for abdominal breathing and breath support exercises.    Time  8    Period  Weeks    Status  New    Target Date  09/18/19      SLP LONG TERM GOAL #3   Title  The patient will maximize voice quality and loudness using breath support/oral resonance for sustained vowel production, pitch glides, and hierarchal speech drill.    Time  8    Period  Weeks    Status  New    Target Date  09/18/19      SLP LONG TERM GOAL #4   Title  The patient will maximize voice quality and loudness using breath support/oral resonance for paragraph length recitation with 80% accuracy.    Time  8    Period  Weeks    Status  New    Target Date  09/18/19       Plan - 08/13/19 1606    Clinical Impression Statement  Patient presents with moderate dysphonia characterized by hoarse vocal quality, reduced breath control for speech, excessive pharyngeal resonance, strained/tense phonation, limited pitch range, vocal fatigue, and laryngeal tension. The patient demonstrates understanding of flow phonation with good mastery of unarticulated/unvoiced airflow and emerging mastery of articulated/unvoiced, unarticulated/voiced sounds, and articulated/voiced sounds. Patient able to discriminate between flow phonation and pressed phonation. Patient is responsive to verbal cueing for improved posture. Will continue ST intervention to address dysphonia.    Speech Therapy Frequency  2x / week    Duration  Other (comment)    Treatment/Interventions  Other (comment);Patient/family education    Potential to Achieve Goals  Good    Potential Considerations  Ability to learn/carryover information;Previous level of function;Co-morbidities;Severity of impairments;Cooperation/participation level;Medical prognosis;Family/community support    SLP Home Exercise Plan  Provided    Consulted and Agree with Plan of Care  Patient       Patient will benefit from  skilled therapeutic intervention in order to improve the following deficits and impairments:   Dysphonia    Problem List There are no active problems to display for this patient.  Burr Soffer A. Francis Dowse., Graduate Clinician Vella Kohler 08/13/2019, 4:07 PM  [place] MAIN [facility] SERVICES [address] [place], [place], [address] Phone: [phone]   Fax:  [phone]   Name: Joshua George MRN: [id number] Date of Birth: Sep 25, 1940

## 2019-08-15 ENCOUNTER — Ambulatory Visit: Payer: Medicare Other | Admitting: Speech Pathology

## 2019-08-15 ENCOUNTER — Ambulatory Visit: Payer: Medicare Other | Admitting: Physical Therapy

## 2019-08-20 ENCOUNTER — Other Ambulatory Visit: Payer: Self-pay

## 2019-08-20 ENCOUNTER — Ambulatory Visit: Payer: Medicare Other

## 2019-08-20 DIAGNOSIS — M6281 Muscle weakness (generalized): Secondary | ICD-10-CM

## 2019-08-20 DIAGNOSIS — R490 Dysphonia: Secondary | ICD-10-CM | POA: Diagnosis not present

## 2019-08-20 DIAGNOSIS — M542 Cervicalgia: Secondary | ICD-10-CM

## 2019-08-20 NOTE — Therapy (Signed)
[place] MAIN [facility] SERVICES [address] [place], [place], [address] Phone: [phone]   Fax:  [phone]  Physical Therapy Treatment  Patient Details  Name: Joshua George MRN: [id number] Date of Birth: 1941/04/29 Referring Provider (PT): Jennings Books   Encounter Date: 08/20/2019  PT End of Session - 08/20/19 7124    Visit Number  5    Number of Visits  17    Date for PT Re-Evaluation  09/30/19    PT Start Time  0933    PT Stop Time  1015    PT Time Calculation (min)  42 min    Equipment Utilized During Treatment  Gait belt    Activity Tolerance  Patient tolerated treatment well;No increased pain       History reviewed. No pertinent past medical history.  History reviewed. No pertinent surgical history.  There were no vitals filed for this visit.  Subjective Assessment - 08/20/19 0931    Subjective  Patient reports that he is doing well today. He denies any neck pain upon arrival. No specific questions or concerns currently.    Pertinent History  Patient has low back pain for over a year that is constant. He does not ambulate with a device. He has had neck pain that is intermittent for over a year and ranges from 0/10 to 4/10.    Currently in Pain?  No/denies    Pain Onset  --         TREATMENT   Ther-ex  UBE x 5 minutes (3 minutes forwards/2 minutes backwards), pt monitored for fatigue; Supine cervical retractions with light overpressure by therapist 5s hold 2 x 10; Standing horizontal abduction with red tband x 10, cues for proper posture and scapular retraction; Standing rows with extensive verbal and tactile cues for scapular retraction with red tband x 10; Seated "W"s with red tband x 10 with therapist standing on band; Standing forearm wall slides overhead for low trap strengthening and pec stretch, 3s hold at end range 2 x 10;   Manual Therapy  Supine canes for overhead shoulder flexion and pec stretch with  overpressure by therapist 30s x 2; Sternal and clavicular attachment pec major stretch by therapist 30s x 2 each bilateral; Prone CPA grade III, 20s/bout, T1-T10, 2 bouts/level;   Pt educated throughout session about proper posture and technique with exercises. Improved exercise technique, movement at target joints, use of target muscles after min to mod verbal, visual, tactile cues.    Pt demonstrates excellent motivation throughout session. As session progresses he demonstrates the ability to better maintain upright posture however he requires repeated cues especially for neck extension and retraction in standing. He demonstrates decreased passive thoracic extension mobility with CPA especially in lower thoracic spine. Pt encouraged to continue HEP and follow-up as scheduled. Pt will benefit from PT services to address deficits in strength, posture, and mobility in order to return to full function at home.           PT Short Term Goals - 07/30/19 0902      PT SHORT TERM GOAL #1   Title  Patient will be independent in home exercise program to improve strength/mobility for better functional independence with ADLs.    Time  4    Period  Weeks    Status  New    Target Date  09/02/19        PT Long Term Goals - 07/30/19 5809      PT  LONG TERM GOAL #1   Title  Patient will increase BLE gross strength to 4+/5 as to improve functional strength for independent gait, increased standing tolerance and increased ADL ability.    Time  8    Period  Weeks    Status  New    Target Date  09/24/19      PT LONG TERM GOAL #2   Title  Patient will be able to perform household work/ chores without increase in symptoms.    Time  8    Period  Weeks    Status  New    Target Date  09/24/19      PT LONG TERM GOAL #3   Title  Patient will report a worst pain of 2/10 on VAS in   neck          to improve tolerance with ADLs and reduced symptoms with activities.    Time  8    Status  New     Target Date  09/24/19            Plan - 08/20/19 6546    Clinical Impression Statement  Pt demonstrates excellent motivation throughout session. As session progresses he demonstrates the ability to better maintain upright posture however he requires repeated cues especially for neck extension and retraction in standing. He demonstrates decreased passive thoracic extension mobility with CPA especially in lower thoracic spine. Pt encouraged to continue HEP and follow-up as scheduled. Pt will benefit from PT services to address deficits in strength, posture, and mobility in order to return to full function at home.    Personal Factors and Comorbidities  Age;Comorbidity 1    Comorbidities  Parkinsons    Examination-Activity Limitations  Caring for Others;Stand;Locomotion Level    Examination-Participation Restrictions  Yard Work;Driving    Stability/Clinical Decision Making  Stable/Uncomplicated    Rehab Potential  Fair    PT Frequency  2x / week    PT Duration  8 weeks    PT Treatment/Interventions  Cryotherapy;Electrical Stimulation;Moist Heat;Traction;Ultrasound;Therapeutic activities;Therapeutic exercise;Patient/family education;Manual techniques;Passive range of motion    PT Next Visit Plan  posture education    PT Home Exercise Plan  chin tucks, scapula retraction    Consulted and Agree with Plan of Care  Patient       Patient will benefit from skilled therapeutic intervention in order to improve the following deficits and impairments:  Pain, Decreased activity tolerance, Decreased endurance, Decreased range of motion, Decreased strength  Visit Diagnosis: Muscle weakness (generalized)  Neck pain, chronic     Problem List There are no active problems to display for this patient.  Lynnea Maizes PT, DPT, GCS  Runell Kovich 08/20/2019, 4:17 PM  [place] [facility] MAIN [facility] SERVICES [address] [place], [place], [address] Phone: [phone]    Fax:  [phone]  Name: Joshua George MRN: [id number] Date of Birth: Nov 13, 1940

## 2019-08-22 ENCOUNTER — Ambulatory Visit: Payer: Medicare Other

## 2019-08-27 ENCOUNTER — Other Ambulatory Visit: Payer: Self-pay

## 2019-08-27 ENCOUNTER — Ambulatory Visit: Payer: Medicare Other

## 2019-08-27 DIAGNOSIS — G8929 Other chronic pain: Secondary | ICD-10-CM

## 2019-08-27 DIAGNOSIS — M6281 Muscle weakness (generalized): Secondary | ICD-10-CM

## 2019-08-27 DIAGNOSIS — R490 Dysphonia: Secondary | ICD-10-CM | POA: Diagnosis not present

## 2019-08-27 NOTE — Therapy (Signed)
[place] MAIN [facility] SERVICES [address] [place], [place], [address] Phone: [phone]   Fax:  [phone]  Physical Therapy Treatment  Patient Details  Name: Joshua George MRN: [id number] Date of Birth: [birth year] Referring Provider (PT): Jennings Books   Encounter Date: 08/27/2019  PT End of Session - 08/27/19 0939    Visit Number  6    Number of Visits  17    Date for PT Re-Evaluation  09/30/19    PT Start Time  0933    PT Stop Time  1015    PT Time Calculation (min)  42 min    Equipment Utilized During Treatment  Gait belt    Activity Tolerance  Patient tolerated treatment well;No increased pain       History reviewed. No pertinent past medical history.  History reviewed. No pertinent surgical history.  There were no vitals filed for this visit.  Subjective Assessment - 08/27/19 0938    Subjective  Patient reports that he is doing well today. He denies any neck pain upon arrival. No specific questions or concerns currently.    Pertinent History  Patient has low back pain for over a year that is constant. He does not ambulate with a device. He has had neck pain that is intermittent for over a year and ranges from 0/10 to 4/10.    Currently in Pain?  No/denies          TREATMENT   Ther-ex  UBE x 5 minutes (2.5 minutes forwards/2.5 minutes backwards), pt monitored for fatigue; Supine cervical retractions with light overpressure by therapist 10s hold 2 x 10; Standing horizontal abduction with red tband 2 x 10, cues for proper posture and scapular retraction; Standing rows with extensive verbal and tactile cues for scapular retraction with green tband 2 x 10; Seated "W"s with red tband 2 x 10 with therapist standing on band; Standing forearm wall slides overhead for low trap strengthening and pec stretch, 10s hold at end range 2 x 5;   Manual Therapy  Supine canes for overhead shoulder flexion and pec stretch with  overpressure by therapist 30s x 3; Sternal and clavicular attachment pec major stretch by therapist 30s x 2 each bilateral; Bilateral shoulder ER stretch with posterior directed GH force x 30s bilateral; Prone CPA grade III, 20s/bout, T1-T10, 1 bouts/level;   Pt educated throughout session about proper posture and technique with exercises. Improved exercise technique, movement at target joints, use of target muscles after min to mod verbal, visual, tactile cues.    Pt demonstrates excellent motivation throughout session. Just like last session as treatment progresses he demonstrates the ability to better maintain upright posture however he requires repeated cues especially for neck extension and retraction in standing. Pt encouraged to continue HEP and follow-up as scheduled. Pt will benefit from PT services to address deficits in strength, posture, and mobility in order to return to full function at home.                        PT Short Term Goals - 07/30/19 0902      PT SHORT TERM GOAL #1   Title  Patient will be independent in home exercise program to improve strength/mobility for better functional independence with ADLs.    Time  4    Period  Weeks    Status  New    Target Date  09/02/19        PT  Long Term Goals - 07/30/19 0903      PT LONG TERM GOAL #1   Title  Patient will increase BLE gross strength to 4+/5 as to improve functional strength for independent gait, increased standing tolerance and increased ADL ability.    Time  8    Period  Weeks    Status  New    Target Date  09/24/19      PT LONG TERM GOAL #2   Title  Patient will be able to perform household work/ chores without increase in symptoms.    Time  8    Period  Weeks    Status  New    Target Date  09/24/19      PT LONG TERM GOAL #3   Title  Patient will report a worst pain of 2/10 on VAS in   neck          to improve tolerance with ADLs and reduced symptoms with activities.    Time   8    Status  New    Target Date  09/24/19            Plan - 08/27/19 8466    Clinical Impression Statement  Pt demonstrates excellent motivation throughout session. Just like last session as treatment progresses he demonstrates the ability to better maintain upright posture however he requires repeated cues especially for neck extension and retraction in standing. Pt encouraged to continue HEP and follow-up as scheduled. Pt will benefit from PT services to address deficits in strength, posture, and mobility in order to return to full function at home.    Personal Factors and Comorbidities  Age;Comorbidity 1    Comorbidities  Parkinsons    Examination-Activity Limitations  Caring for Others;Stand;Locomotion Level    Examination-Participation Restrictions  Yard Work;Driving    Stability/Clinical Decision Making  Stable/Uncomplicated    Rehab Potential  Fair    PT Frequency  2x / week    PT Duration  8 weeks    PT Treatment/Interventions  Cryotherapy;Electrical Stimulation;Moist Heat;Traction;Ultrasound;Therapeutic activities;Therapeutic exercise;Patient/family education;Manual techniques;Passive range of motion    PT Next Visit Plan  posture education    PT Home Exercise Plan  chin tucks, scapula retraction    Consulted and Agree with Plan of Care  Patient       Patient will benefit from skilled therapeutic intervention in order to improve the following deficits and impairments:  Pain, Decreased activity tolerance, Decreased endurance, Decreased range of motion, Decreased strength  Visit Diagnosis: Muscle weakness (generalized)  Neck pain, chronic     Problem List There are no problems to display for this patient.  Lynnea Maizes PT, DPT, GCS  Niki Cosman 08/27/2019, 12:47 PM  [place] [facility] MAIN [facility] SERVICES [address] [place], [place], [address] Phone: [phone]   Fax:  [phone]  Name: Joab Carden MRN: [id number] Date  of Birth: May 09, 1941

## 2019-08-30 ENCOUNTER — Ambulatory Visit: Payer: Medicare Other | Admitting: Speech Pathology

## 2019-09-02 ENCOUNTER — Ambulatory Visit: Payer: Medicare Other

## 2019-09-02 ENCOUNTER — Other Ambulatory Visit: Payer: Self-pay

## 2019-09-02 DIAGNOSIS — G8929 Other chronic pain: Secondary | ICD-10-CM

## 2019-09-02 DIAGNOSIS — M6281 Muscle weakness (generalized): Secondary | ICD-10-CM

## 2019-09-02 DIAGNOSIS — R490 Dysphonia: Secondary | ICD-10-CM | POA: Diagnosis not present

## 2019-09-02 DIAGNOSIS — M542 Cervicalgia: Secondary | ICD-10-CM

## 2019-09-02 NOTE — Therapy (Signed)
[place] MAIN [facility] SERVICES [address] [place], [place], [address] Phone: [phone]   Fax:  [phone]  Physical Therapy Treatment  Patient Details  Name: Joshua George MRN: [id number] Date of Birth: Nov 03, 1940 Referring Provider (PT): Jennings Books   Encounter Date: 09/02/2019  PT End of Session - 09/02/19 1405    Visit Number  7    Number of Visits  17    Date for PT Re-Evaluation  09/30/19    PT Start Time  1301    PT Stop Time  1345    PT Time Calculation (min)  44 min    Activity Tolerance  Patient tolerated treatment well;No increased pain       No past medical history on file.  No past surgical history on file.  There were no vitals filed for this visit.  Subjective Assessment - 09/02/19 1305    Subjective  Patient stated he is doing well today, no complaints of pain.    Pertinent History  Patient has low back pain for over a year that is constant. He does not ambulate with a device. He has had neck pain that is intermittent for over a year and ranges from 0/10 to 4/10.    Currently in Pain?  No/denies    Pain Onset  Today             TREATMENT    Ther-ex  UBE x 5 minutes (2.5 minutes forwards/2.5 minutes backwards), pt monitored for fatigue; Supine cervical retractions with light overpressure by therapist 10s hold  x 10; Standing horizontal abduction with red tband 2 x 10, cues for proper posture and scapular retraction; washcloth rolled placed between shoulder blades for tactile cues  Standing rows with extensive verbal and tactile cues for scapular retraction with green tband 2 x 10; Postural correction at wall with 1 pillow 10x3 second holds Postural correction at wall with 2 pillows and bilateral ER with RTB pt needed multimodal cues for form     Manual Therapy  Supine canes for overhead shoulder flexion and pec stretch with overpressure by therapist 30s x 3; Sternal and clavicular attachment pec major  stretch by therapist 30s x 2 each bilateral; Bilateral shoulder ER stretch with posterior directed GH force x 30s bilateral; Prone CPA grade III, 20s/bout, T1-T10, 1 bouts/level;     Pt educated throughout session about proper posture and technique with exercises. Improved exercise technique, movement at target joints, use of target muscles after min to mod verbal, visual, tactile cues.      Patient response/clinical impression: with repetition the patient demonstrated decreased muscle tension and improved ROM for bilateral shoulders and postural correction. The patient needs multi-modal repetitive cues for proper muscle activation and postural correct throughout all exercises, but did demonstrate an improved ability to perform scapular retraction with time.    PT Education - 09/02/19 1306    Education Details  therapeutic form/technique    Person(s) Educated  Patient    Methods  Explanation;Demonstration    Comprehension  Verbalized understanding;Returned demonstration;Verbal cues required;Tactile cues required;Need further instruction       PT Short Term Goals - 07/30/19 0902      PT SHORT TERM GOAL #1   Title  Patient will be independent in home exercise program to improve strength/mobility for better functional independence with ADLs.    Time  4    Period  Weeks    Status  New    Target Date  09/02/19  PT Long Term Goals - 07/30/19 0903      PT LONG TERM GOAL #1   Title  Patient will increase BLE gross strength to 4+/5 as to improve functional strength for independent gait, increased standing tolerance and increased ADL ability.    Time  8    Period  Weeks    Status  New    Target Date  09/24/19      PT LONG TERM GOAL #2   Title  Patient will be able to perform household work/ chores without increase in symptoms.    Time  8    Period  Weeks    Status  New    Target Date  09/24/19      PT LONG TERM GOAL #3   Title  Patient will report a worst pain of 2/10 on  VAS in   neck          to improve tolerance with ADLs and reduced symptoms with activities.    Time  8    Status  New    Target Date  09/24/19            Plan - 09/02/19 1354    Clinical Impression Statement  with repetition the patient demonstrated decreased muscle tension and improved ROM for bilateral shoulders and postural correction. The patient needs multi-modal repetitive cues for proper muscle activation and postural correct throughout all exercises, but did demonstrate an improved ability to perform scapular retraction with time.    Personal Factors and Comorbidities  Age;Comorbidity 1    Comorbidities  Parkinsons    Examination-Activity Limitations  Caring for Others;Stand;Locomotion Level    Examination-Participation Restrictions  Yard Work;Driving    Stability/Clinical Decision Making  Stable/Uncomplicated    Rehab Potential  Fair    PT Frequency  2x / week    PT Duration  8 weeks    PT Treatment/Interventions  Cryotherapy;Electrical Stimulation;Moist Heat;Traction;Ultrasound;Therapeutic activities;Therapeutic exercise;Patient/family education;Manual techniques;Passive range of motion    PT Next Visit Plan  posture education    PT Home Exercise Plan  chin tucks, scapula retraction    Consulted and Agree with Plan of Care  Patient       Patient will benefit from skilled therapeutic intervention in order to improve the following deficits and impairments:  Pain, Decreased activity tolerance, Decreased endurance, Decreased range of motion, Decreased strength  Visit Diagnosis: Muscle weakness (generalized)  Neck pain, chronic     Problem List There are no problems to display for this patient.   Olga Coaster PT, DPT [phone] PM,09/02/19    [address] [facility] MAIN [facility] SERVICES [address] [place], [place], [address] Phone: [phone]   Fax:  [phone]  Name: Joshua George MRN: [id number] Date of Birth: 1941/08/30

## 2019-09-04 ENCOUNTER — Encounter: Payer: Medicare Other | Admitting: Occupational Therapy

## 2019-09-04 ENCOUNTER — Ambulatory Visit: Payer: Medicare Other | Admitting: Speech Pathology

## 2019-09-09 ENCOUNTER — Ambulatory Visit: Payer: Medicare Other

## 2019-09-09 ENCOUNTER — Other Ambulatory Visit: Payer: Self-pay

## 2019-09-09 DIAGNOSIS — R490 Dysphonia: Secondary | ICD-10-CM | POA: Diagnosis not present

## 2019-09-09 DIAGNOSIS — M6281 Muscle weakness (generalized): Secondary | ICD-10-CM

## 2019-09-09 NOTE — Therapy (Signed)
[place] [facility]IN [facility] SERVICES [address] [place], [place], [address] Phone: [phone]   Fax:  [phone]  Physical Therapy Treatment  Patient Details  Name: Joshua George MRN: [id number] Date of Birth: May 06, 1941 Referring Provider (PT): Joshua George   Encounter Date: 09/09/2019  PT End of Session - 09/09/19 1310    Visit Number  8    Number of Visits  17    Date for PT Re-Evaluation  09/30/19    PT Start Time  5284    PT Stop Time  1345    PT Time Calculation (min)  40 min    Activity Tolerance  Patient tolerated treatment well;No increased pain       History reviewed. No pertinent past medical history.  History reviewed. No pertinent surgical history.  There were no vitals filed for this visit.  Subjective Assessment - 09/09/19 1308    Subjective  Patient stated he is doing well today, no complaints of pain. No changes in health/medications since last visit.    Pertinent History  Patient has low back pain for over a year that is constant. He does not ambulate with a device. He has had neck pain that is intermittent for over a year and ranges from 0/10 to 4/10.    Currently in Pain?  No/denies    Pain Onset  --         TREATMENT   Ther-ex UBE x 5 minutes (2.55minutes forwards/2.63minutes backwards), pt monitored for fatigue; TRX rows x 10, pt requires extensive cues for posture and form and still struggles to extend elbows when resting so didn't perform a second set Matrix rows 12.5# 3 x 10, extensive verbal and tactile cues for proper posture of head/neck as well as scapular retraction; Standing horizontal abduction with red tband2x 10, cues for proper posture and scapular retraction; Postural correction at wall with 2 pillow and cervical retractions 10 x 5 seconds holds; Standing forearm wall slides with 5s hold at top 2 x 10; Supine canes for overhead shoulder flexion and pec stretch 5s hold at end range x  10; Supine cervical retractions 5s hold  x 10;   Manual Therapy Sternal and clavicular attachment pec major stretch by therapist 30s x 2 each bilateral; Bilateral shoulder ER stretch with posterior directed GH force x 30s bilateral; Prone CPA grade III, 20s/bout, T1-T10,1bouts/level;   Pt educated throughout session about proper posture and technique with exercises. Improved exercise technique, movement at target joints, use of target muscles after min to mod verbal, visual, tactile cues.   With repetition the patient demonstrated decreased muscle tension and improved ROM for bilateral shoulders and postural correction. The patient needs multi-modal repetitive cues for proper muscle activation and postural correction throughout all exercises, but did demonstrate an improved ability to perform scapular retraction with cues. 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 Pt will benefit from PT services to address deficits in strength and posture in order to return to full function at home.                         PT Short Term Goals - 07/30/19 0902      PT SHORT TERM GOAL #1   Title  Patient will be independent in home exercise program to improve strength/mobility for better functional independence with ADLs.    Time  4    Period  Weeks  Status  New    Target Date  09/02/19        PT Long Term Goals - 07/30/19 0903      PT LONG TERM GOAL #1   Title  Patient will increase BLE gross strength to 4+/5 as to improve functional strength for independent gait, increased standing tolerance and increased ADL ability.    Time  8    Period  Weeks    Status  New    Target Date  09/24/19      PT LONG TERM GOAL #2   Title  Patient will be able to perform household work/ chores without increase in symptoms.    Time  8    Period  Weeks    Status  New    Target Date  09/24/19      PT LONG TERM  GOAL #3   Title  Patient will report a worst pain of 2/10 on VAS in   neck          to improve tolerance with ADLs and reduced symptoms with activities.    Time  8    Status  New    Target Date  09/24/19            Plan - 09/09/19 1310    Clinical Impression Statement  With repetition the patient demonstrated decreased muscle tension and improved ROM for bilateral shoulders and postural correction. The patient needs multi-modal repetitive cues for proper muscle activation and postural correction throughout all exercises, but did demonstrate an improved ability to perform scapular retraction with cues. 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 Pt will benefit from PT services to address deficits in strength and posture in order to return to full function at home.    Personal Factors and Comorbidities  Age;Comorbidity 1    Comorbidities  Parkinsons    Examination-Activity Limitations  Caring for Others;Stand;Locomotion Level    Examination-Participation Restrictions  Yard Work;Driving    Stability/Clinical Decision Making  Stable/Uncomplicated    Rehab Potential  Fair    PT Frequency  2x / week    PT Duration  8 weeks    PT Treatment/Interventions  Cryotherapy;Electrical Stimulation;Moist Heat;Traction;Ultrasound;Therapeutic activities;Therapeutic exercise;Patient/family education;Manual techniques;Passive range of motion    PT Next Visit Plan  posture education    PT Home Exercise Plan  chin tucks, scapula retraction    Consulted and Agree with Plan of Care  Patient       Patient will benefit from skilled therapeutic intervention in order to improve the following deficits and impairments:  Pain, Decreased activity tolerance, Decreased endurance, Decreased range of motion, Decreased strength  Visit Diagnosis: Muscle weakness (generalized)     Problem List There are no problems to display for this  patient.  Joshua George PT, DPT, GCS  Joshua George 09/09/2019, 1:55 PM  [place] [facility] MAIN [facility] SERVICES [address] [place], [place], [address] Phone: [phone]   Fax:  [phone]  Name: Joshua George MRN: [id number] Date of Birth: Jan 25, 1941

## 2019-09-11 ENCOUNTER — Ambulatory Visit: Payer: Medicare Other | Admitting: Physical Therapy

## 2019-09-11 ENCOUNTER — Ambulatory Visit: Payer: Medicare Other | Admitting: Speech Pathology

## 2019-09-16 ENCOUNTER — Other Ambulatory Visit: Payer: Self-pay

## 2019-09-16 ENCOUNTER — Ambulatory Visit: Payer: Medicare PPO | Attending: Neurology | Admitting: Speech Pathology

## 2019-09-16 ENCOUNTER — Encounter: Payer: Self-pay | Admitting: Speech Pathology

## 2019-09-16 DIAGNOSIS — M542 Cervicalgia: Secondary | ICD-10-CM | POA: Insufficient documentation

## 2019-09-16 DIAGNOSIS — R2681 Unsteadiness on feet: Secondary | ICD-10-CM | POA: Insufficient documentation

## 2019-09-16 DIAGNOSIS — G8929 Other chronic pain: Secondary | ICD-10-CM | POA: Diagnosis present

## 2019-09-16 DIAGNOSIS — R490 Dysphonia: Secondary | ICD-10-CM | POA: Insufficient documentation

## 2019-09-16 DIAGNOSIS — M6281 Muscle weakness (generalized): Secondary | ICD-10-CM | POA: Insufficient documentation

## 2019-09-16 NOTE — Therapy (Signed)
[place] [facility] MAIN [facility] SERVICES [address] [place], [place], [address] Phone: [phone]   Fax:  [phone]  Speech Language Pathology Treatment  Patient Details  Name: Joshua George MRN: [id number] Date of Birth: Feb 12, 1941 Referring Provider (SLP): Dr. Sherryll Burger   Encounter Date: 09/16/2019  End of Session - 09/16/19 1317    Visit Number  6    Number of Visits  17    Date for SLP Re-Evaluation  09/18/19    Authorization Type  Medicare    Authorization Time Period  Start 07/24/2019    Authorization - Visit Number  6    Authorization - Number of Visits  10    SLP Start Time  1100    SLP Stop Time   1150    SLP Time Calculation (min)  50 min    Activity Tolerance  Patient tolerated treatment well       History reviewed. No pertinent past medical history.  History reviewed. No pertinent surgical history.  There were no vitals filed for this visit.  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  The patient was provided with written and verbal teaching regarding neck, shoulder, tongue, and throat stretches exercises to promote relaxed phonation.   The patient was provided with written and verbal teaching regarding breath support exercises.  The patient was instructed in the need for good posture to improve airflow and breath  support.  Flow Phonation Skill Level One: establish airflow release: Unarticulated: Patient able to generate multiple samples of unarticulated airflow without stoppage due to tension.  Articulated: Patient able to maintain articulated airflow for up to 10 syllables.  Skill Level Two: Airflow + Voicing: Unarticulated:  Patient consistently able to produce relaxed phonation with oral resonance in unarticulated voiced airflow.  Articulated: Patient able to read aloud sentences and answer simple questions using strong voice with oral resonance.        Assessment / Recommendations / Plan   Plan  Continue with current plan of care      Progression Toward Goals   Progression toward goals  Progressing toward goals       SLP Education - 09/16/19 1317    Education Details  Flow phonation    Person(s) Educated  Patient    Methods  Explanation    Comprehension  Verbalized understanding         SLP Long Term Goals - 07/24/19 1101      SLP LONG TERM GOAL #1   Title  The patient will demonstrate independent understanding of vocal hygiene concepts and extrinsic laryngeal muscle stretches.    Time  8    Period  Weeks    Status  New    Target Date  09/18/19  SLP LONG TERM GOAL #2   Title  The patient will be independent for abdominal breathing and breath support exercises.    Time  8    Period  Weeks    Status  New    Target Date  09/18/19      SLP LONG TERM GOAL #3   Title  The patient will maximize voice quality and loudness using breath support/oral resonance for sustained vowel production, pitch glides, and hierarchal speech drill.    Time  8    Period  Weeks    Status  New    Target Date  09/18/19      SLP LONG TERM GOAL #4   Title  The patient will maximize voice quality and loudness using breath support/oral resonance for paragraph length recitation with 80% accuracy.    Time  8    Period  Weeks    Status  New    Target Date  09/18/19       Plan - 09/16/19 1318     Clinical Impression Statement  The patient demonstrates understanding of flow phonation with good mastery of unarticulated/unvoiced airflow, articulated/unvoiced speech, unarticulated/voiced airflow, and articulated/voiced speech.   He is not, at this point, implementing the improved voice in conversational speech.    Speech Therapy Frequency  2x / week    Duration  Other (comment)    Treatment/Interventions  Other (comment);Patient/family education    Potential to Achieve Goals  Good    Potential Considerations  Ability to learn/carryover information;Previous level of function;Co-morbidities;Severity of impairments;Cooperation/participation level;Medical prognosis;Family/community support    SLP Home Exercise Plan  Provided    Consulted and Agree with Plan of Care  Patient       Patient will benefit from skilled therapeutic intervention in order to improve the following deficits and impairments:   Dysphonia    Problem List There are no problems to display for this patient.  [name], MS/CCC- SLP  [name] 09/16/2019, 1:19 PM  [place] MAIN [facility] SERVICES [address] [place], [place], [address] Phone: [phone]   Fax:  [phone]   Name: Joshua George MRN: [id number] Date of Birth: 1940-12-19

## 2019-09-18 ENCOUNTER — Ambulatory Visit: Payer: Medicare PPO | Admitting: Speech Pathology

## 2019-09-18 ENCOUNTER — Ambulatory Visit: Payer: Medicare PPO

## 2019-09-18 ENCOUNTER — Encounter: Payer: Self-pay | Admitting: Speech Pathology

## 2019-09-18 ENCOUNTER — Other Ambulatory Visit: Payer: Self-pay

## 2019-09-18 DIAGNOSIS — R490 Dysphonia: Secondary | ICD-10-CM

## 2019-09-18 DIAGNOSIS — M6281 Muscle weakness (generalized): Secondary | ICD-10-CM

## 2019-09-18 DIAGNOSIS — M542 Cervicalgia: Secondary | ICD-10-CM

## 2019-09-18 DIAGNOSIS — G8929 Other chronic pain: Secondary | ICD-10-CM

## 2019-09-18 NOTE — Therapy (Signed)
[place] [facility] MAIN [facility] SERVICES [address] [place], [place], [address] Phone: [phone]   Fax:  [phone]  Speech Language Pathology Treatment  Patient Details  Name: Joshua George MRN: [id number] Date of Birth: August 10, 1941 Referring Provider (SLP): Dr. Sherryll Burger   Encounter Date: 09/18/2019  End of Session - 09/18/19 1058    Visit Number  7    Number of Visits  17    Date for SLP Re-Evaluation  09/18/19    Authorization Type  Medicare    Authorization Time Period  Start 07/24/2019    Authorization - Visit Number  7    Authorization - Number of Visits  10    SLP Start Time  1000    SLP Stop Time   1050    SLP Time Calculation (min)  50 min    Activity Tolerance  Patient tolerated treatment well       History reviewed. No pertinent past medical history.  History reviewed. No pertinent surgical history.  There were no vitals filed for this visit.  Subjective Assessment - 09/18/19 1057    Subjective  The patient reports that while his voice is better, he is still not loud enough for his wife.            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  The patient was provided with written and verbal teaching regarding neck, shoulder, tongue, and throat stretches exercises to promote relaxed phonation.   The patient was provided with written and verbal teaching regarding breath support exercises.  The patient was instructed in the need for good posture to improve airflow and  breath support.  Flow Phonation Skill Level One: establish airflow release: Unarticulated: Patient able to generate multiple samples of unarticulated airflow without stoppage due to tension.  Articulated: Patient able to maintain articulated airflow for up to 10 syllables.  Skill Level Two: Airflow + Voicing: Unarticulated:  Patient consistently able to produce relaxed phonation with oral resonance in unarticulated voiced airflow.  Articulated: Patient able to read aloud sentences and answer simple questions using strong voice with oral resonance.  Initiated voice Building exercises.  Loud "Ah" 75 dB without strain, pitch glides with reduced range, reading aloud at average 75 db.      Assessment / Recommendations / Plan   Plan  Continue with current plan of care      Progression Toward Goals   Progression toward goals  Progressing toward goals       SLP Education - 09/18/19 1058    Education Details  Voice building exercises    Person(s) Educated  Patient    Methods  Explanation    Comprehension  Verbalized understanding         SLP Long Term Goals - 07/24/19 1101      SLP LONG TERM GOAL #1   Title  The patient will demonstrate independent understanding of vocal hygiene concepts and extrinsic laryngeal muscle stretches.    Time  8    Period  Weeks    Status  New    Target Date  09/18/19      SLP LONG TERM GOAL #2   Title  The patient will be independent for abdominal breathing and breath support exercises.    Time  8    Period  Weeks    Status  New    Target Date  09/18/19      SLP LONG TERM GOAL #3   Title  The patient will maximize voice quality and loudness using breath support/oral resonance for sustained vowel production, pitch glides, and hierarchal speech drill.    Time  8    Period  Weeks    Status  New    Target Date  09/18/19      SLP LONG TERM GOAL #4   Title  The patient will maximize voice quality and loudness using breath support/oral resonance for paragraph  length recitation with 80% accuracy.    Time  8    Period  Weeks    Status  New    Target Date  09/18/19       Plan - 09/18/19 1059    Clinical Impression Statement  The patient demonstrates understanding of flow phonation with good mastery of unarticulated/unvoiced airflow, articulated/unvoiced speech, unarticulated/voiced airflow, and articulated/voiced speech.   He has been given instruction in generating louder voice with out strain for voice strengthening.    Speech Therapy Frequency  2x / week    Duration  Other (comment)    Treatment/Interventions  Other (comment);Patient/family education    Potential to Achieve Goals  Good    Potential Considerations  Ability to learn/carryover information;Previous level of function;Co-morbidities;Severity of impairments;Cooperation/participation level;Medical prognosis;Family/community support    SLP Home Exercise Plan  Provided    Consulted and Agree with Plan of Care  Patient       Patient will benefit from skilled therapeutic intervention in order to improve the following deficits and impairments:   Dysphonia    Problem List There are no problems to display for this patient.  [name], [name], [name] 09/18/2019, 11:00 AM  [place] MAIN [facility] SERVICES [address] [place], [place], [address] Phone: [phone]   Fax:  [phone]   Name: Joshua George MRN: [id number] Date of Birth: Nov 12, 1940

## 2019-09-18 NOTE — Therapy (Signed)
[place] [facility] MAIN [facility] SERVICES [address] [place], [place], [address] Phone: [phone]   Fax:  [phone]  Physical Therapy Treatment  Patient Details  Name: Joshua George MRN: [id number] Date of Birth: Feb 17, 1941 Referring Provider (PT): Cristopher Peru   Encounter Date: 09/18/2019  PT End of Session - 09/18/19 1121    Visit Number  9    Number of Visits  17    Date for PT Re-Evaluation  09/30/19    PT Start Time  1115    PT Stop Time  1200    PT Time Calculation (min)  45 min    Activity Tolerance  Patient tolerated treatment well       History reviewed. No pertinent past medical history.  History reviewed. No pertinent surgical history.  There were no vitals filed for this visit.  Subjective Assessment - 09/18/19 1121    Subjective  Patient stated he is doing well today, no complaints of pain. Patient is compliant with HEP.    Pertinent History  Patient has low back pain for over a year that is constant. He does not ambulate with a device. He has had neck pain that is intermittent for over a year and ranges from 0/10 to 4/10.    Currently in Pain?  No/denies               TREATMENT     Ther-ex  UBE x 5 minutes (2.5 minutes forwards/2.5 minutes backwards), pt monitored for fatigue; Matrix rows 12.5# 3 x 10, extensive verbal and tactile cues for proper posture of head/neck as well as scapular retraction; Standing horizontal abduction with red tband 2 x 10, cues for proper posture and scapular retraction; Postural correction at wall with 2 pillow and cervical retractions 10 x 5 seconds holds; postural correction with pillows (2) and forward cane flexion in standing 2x5 with cues for eyes at PT/head up Seated thoracic overpressure from PT 5x5sec holds ea direction  Pt informed to attempt postural corrections at wall as part of HEP.     Manual Therapy  Sternal and clavicular attachment pec major stretch by therapist 30s x  2 each bilateral; Bilateral shoulder ER stretch with posterior directed GH force x 30s bilateral; Scapular PNF bilaterally (first retract-protract PROM, AAROM, then PT resisted movement)   Pt educated throughout session about proper posture and technique with exercises. Improved exercise technique, movement at target joints, use of target muscles after min to mod verbal, visual, tactile cues.      Pt response/clinical impression: Patient challenged by scapular muscle activation awareness. Some improvement noted with PNF patterns with constant tactile/verbal cueing. Pt with noticeable improved upright posture compared to start of session. The patient had no complaints of pain with all exercises but endorsed muscle work with scapular retractions. The patient would benefit from further skilled PT intervention to address ongoing postural abnormalities to progress towards goals and improve function.       PT Education - 09/18/19 1121    Education Details  therapeutic form/technique    Person(s) Educated  Patient    Methods  Explanation;Demonstration    Comprehension  Verbalized understanding;Returned demonstration;Verbal cues required       PT Short Term Goals - 07/30/19 0902      PT SHORT TERM GOAL #1   Title  Patient will be independent in home exercise program to improve strength/mobility for better functional independence with ADLs.    Time  4    Period  Weeks    Status  New    Target Date  09/02/19        PT Long Term Goals - 07/30/19 0903      PT LONG TERM GOAL #1   Title  Patient will increase BLE gross strength to 4+/5 as to improve functional strength for independent gait, increased standing tolerance and increased ADL ability.    Time  8    Period  Weeks    Status  New    Target Date  09/24/19      PT LONG TERM GOAL #2   Title  Patient will be able to perform household work/ chores without increase in symptoms.    Time  8    Period  Weeks    Status  New    Target  Date  09/24/19      PT LONG TERM GOAL #3   Title  Patient will report a worst pain of 2/10 on VAS in   neck          to improve tolerance with ADLs and reduced symptoms with activities.    Time  8    Status  New    Target Date  09/24/19            Plan - 09/18/19 1229    Clinical Impression Statement  Patient challenged by scapular muscle activation awareness. Some improvement noted with PNF patterns with constant tactile/verbal cueing. Pt with noticeable improved upright posture compared to start of session. The patient had no complaints of pain with all exercises but endorsed muscle work with scapular retractions. The patient would benefit from further skilled PT intervention to address ongoing postural abnormalities to progress towards goals and improve function.    Personal Factors and Comorbidities  Age;Comorbidity 1    Comorbidities  Parkinsons    Examination-Activity Limitations  Caring for Others;Stand;Locomotion Level    Examination-Participation Restrictions  Yard Work;Driving    Stability/Clinical Decision Making  Stable/Uncomplicated    Rehab Potential  Fair    PT Frequency  2x / week    PT Duration  8 weeks    PT Treatment/Interventions  Cryotherapy;Electrical Stimulation;Moist Heat;Traction;Ultrasound;Therapeutic activities;Therapeutic exercise;Patient/family education;Manual techniques;Passive range of motion    PT Next Visit Plan  posture education    PT Home Exercise Plan  chin tucks, scapula retraction    Consulted and Agree with Plan of Care  Patient       Patient will benefit from skilled therapeutic intervention in order to improve the following deficits and impairments:  Pain, Decreased activity tolerance, Decreased endurance, Decreased range of motion, Decreased strength  Visit Diagnosis: Muscle weakness (generalized)  Neck pain, chronic     Problem List There are no problems to display for this patient.   Lieutenant Diego PT, DPT 12:34  PM,09/18/19   [place] MAIN [facility] SERVICES [address] [place], [place], [address] Phone: [phone]   Fax:  [phone]  Name: Joshua George MRN: [id number] Date of Birth: [birth year]

## 2019-09-25 ENCOUNTER — Other Ambulatory Visit: Payer: Self-pay

## 2019-09-25 ENCOUNTER — Ambulatory Visit: Payer: Medicare PPO

## 2019-09-25 DIAGNOSIS — G8929 Other chronic pain: Secondary | ICD-10-CM

## 2019-09-25 DIAGNOSIS — M6281 Muscle weakness (generalized): Secondary | ICD-10-CM

## 2019-09-25 DIAGNOSIS — R490 Dysphonia: Secondary | ICD-10-CM | POA: Diagnosis not present

## 2019-09-25 NOTE — Therapy (Signed)
[place] MAIN [facility] SERVICES [address] [place], [place], [address] Phone: [phone]   Fax:  [phone]  Physical Therapy Progress Note   Dates of reporting period  07/30/19   to   09/25/19  Patient Details  Name: Joshua George MRN: [id number] Date of Birth: [birth year] Referring Provider (PT): Jennings Books   Encounter Date: 09/25/2019  PT End of Session - 09/25/19 0940    Visit Number  10    Number of Visits  17    Date for PT Re-Evaluation  09/30/19    PT Start Time  0935    PT Stop Time  1015    PT Time Calculation (min)  40 min    Activity Tolerance  Patient tolerated treatment well       History reviewed. No pertinent past medical history.  History reviewed. No pertinent surgical history.  There were no vitals filed for this visit.  Subjective Assessment - 09/25/19 0939    Subjective  Patient stated he is doing well today, no complaints of pain. Patient is compliant with HEP.    Pertinent History  Patient has low back pain for over a year that is constant. He does not ambulate with a device. He has had neck pain that is intermittent for over a year and ranges from 0/10 to 4/10.    Currently in Pain?  No/denies         TREATMENT   Ther-ex UBE x 5 minutes (2.37mnutes forwards/2.521mutes backwards), pt monitored for fatigue; Supine bilateral shoulder flexion with cane and end range hold for stretch 10s hold x 15, bolster under knees to prevent lumbar extension; Goals updated with patient with extensive discussion regarding contiuation of therapy for balance/strength or formal LSVT Big Parkinson's program. Pt reports he will talk to his wife regarding these options and update therapist at next visit at which time re certification will be requested or pt will be discharged; Strength testing performed with patient (see below) BLE MMT: R/L 4+/4+ Hip flexion 5/5 Hip external rotation 5/5 Hip internal rotation 4-/4-  Hip extension  4-/4- Hip abduction 4-/4- Hip adduction 5/5 Knee extension 4+/4+ Knee flexion 4+/4+ Ankle Dorsiflexion <3/<3 Ankle Plantarflexion (Pt able to clear heels off floor but not perform full heel raise) *indicates pain Seated horizontal abduction with red tband2 x 15, cues for proper posture and scapular retraction; Standing forearm wall slides with 5s hold at top x 15   Pt educated throughout session about proper posture and technique with exercises. Improved exercise technique, movement at target joints, use of target muscles after min to mod verbal, visual, tactile cues.   Goals updated with patient today. He has met most of his long term goals with the exception of continued LE weakness. He denies any further neck pain which all of his activities at home. He is performing his HEP 3-4x/wk but not daily. Extensive discussion with patient regarding contiuation of therapy for balance/strength or formal LSVT Big Parkinson's program. Pt reports he will talk to his wife regarding these options and update therapist at next visit at which time re certification will be requested or pt will be discharged. He demonstrates considerable deficits in gait/balance/strength which would benefit from additional therapy. Pt will benefit from PT services to address deficits in strength, gait, and balance in order to return to full function at home.                      PT  Short Term Goals - 09/25/19 0949      PT SHORT TERM GOAL #1   Title  Patient will be independent in home exercise program to improve strength/mobility for better functional independence with ADLs.    Baseline  09/25/19: pt performing 3-4x/wk    Time  4    Period  Weeks    Status  Partially Met    Target Date  09/02/19        PT Long Term Goals - 09/25/19 0950      PT LONG TERM GOAL #1   Title  Patient will increase BLE gross strength to 4+/5 as to improve functional strength for independent gait,  increased standing tolerance and increased ADL ability.    Baseline  09/25/19: R/L Hip extension: 4-/4-, Hip abduction: 4-/4-, Hip adduction 4-/4-, Ankle PF: <3/<3, (otherwise at least 4+/5 bilateral)    Time  8    Period  Weeks    Status  On-going    Target Date  09/24/19      PT LONG TERM GOAL #2   Title  Patient will be able to perform household work/ chores without increase in symptoms.    Baseline  09/25/19: Pt reports no further neck pain, able to perform all activities without pain.    Time  8    Period  Weeks    Status  Achieved      PT LONG TERM GOAL #3   Title  Patient will report a worst pain of 2/10 on VAS in   neck    to improve tolerance with ADLs and reduced symptoms with activities.    Baseline  09/25/19: 0/10, pt reports no further neck pain    Time  8    Status  Achieved            Plan - 09/25/19 0940    Clinical Impression Statement  Goals updated with patient today. He has met most of his long term goals with the exception of continued LE weakness. He denies any further neck pain which all of his activities at home. He is performing his HEP 3-4x/wk but not daily. Extensive discussion with patient regarding contiuation of therapy for balance/strength or formal LSVT Big Parkinson's program. Pt reports he will talk to his wife regarding these options and update therapist at next visit at which time re certification will be requested or pt will be discharged. He demonstrates considerable deficits in gait/balance/strength which would benefit from additional therapy. Pt will benefit from PT services to address deficits in strength, gait, and balance in order to return to full function at home.    Personal Factors and Comorbidities  Age;Comorbidity 1    Comorbidities  Parkinsons    Examination-Activity Limitations  Caring for Others;Stand;Locomotion Level    Examination-Participation Restrictions  Yard Work;Driving    Stability/Clinical Decision Making   Stable/Uncomplicated    Rehab Potential  Fair    PT Frequency  2x / week    PT Duration  8 weeks    PT Treatment/Interventions  Cryotherapy;Electrical Stimulation;Moist Heat;Traction;Ultrasound;Therapeutic activities;Therapeutic exercise;Patient/family education;Manual techniques;Passive range of motion    PT Next Visit Plan  Recertification, discuss either LSVT Big program or balance/strengthening in regular therapy, posture education    PT Home Exercise Plan  chin tucks, scapula retraction    Consulted and Agree with Plan of Care  Patient       Patient will benefit from skilled therapeutic intervention in order to improve the following deficits and impairments:  Pain, Decreased activity tolerance, Decreased endurance, Decreased range of motion, Decreased strength  Visit Diagnosis: Muscle weakness (generalized)  Neck pain, chronic     Problem List There are no problems to display for this patient.  Phillips Grout PT, DPT, GCS  Joshua George 09/25/2019, 1:42 PM  [place] MAIN [facility] SERVICES [address] [place], [place], [address] Phone: [phone]   Fax:  [phone]  Name: Joshua George MRN: [id number] Date of Birth: 1940-10-12

## 2019-09-30 ENCOUNTER — Ambulatory Visit: Payer: Medicare PPO

## 2019-09-30 ENCOUNTER — Other Ambulatory Visit: Payer: Self-pay

## 2019-09-30 DIAGNOSIS — M6281 Muscle weakness (generalized): Secondary | ICD-10-CM

## 2019-09-30 DIAGNOSIS — G8929 Other chronic pain: Secondary | ICD-10-CM

## 2019-09-30 DIAGNOSIS — M542 Cervicalgia: Secondary | ICD-10-CM

## 2019-09-30 DIAGNOSIS — R490 Dysphonia: Secondary | ICD-10-CM | POA: Diagnosis not present

## 2019-09-30 NOTE — Therapy (Signed)
[place] MAIN [facility] SERVICES [address] [place], [place], [address] Phone: [phone]   Fax:  [phone]  Physical Therapy Treatment/Recertification  Patient Details  Name: Joshua George MRN: [id number] Date of Birth: Apr 26, 1941 Referring Provider (PT): Jennings Books   Encounter Date: 09/30/2019  PT End of Session - 09/30/19 1505    Visit Number  11    Number of Visits  33    Date for PT Re-Evaluation  11/25/19    PT Start Time  1020    PT Stop Time  1105    PT Time Calculation (min)  45 min    Equipment Utilized During Treatment  Gait belt    Activity Tolerance  Patient tolerated treatment well    Behavior During Therapy  [facility] for tasks assessed/performed       History reviewed. No pertinent past medical history.  History reviewed. No pertinent surgical history.  There were no vitals filed for this visit.  Subjective Assessment - 09/30/19 1032    Subjective  Patient stated he is doing well today, no complaints of pain. Patient is compliant with HEP. He would like to continue with some balance and strength exercises but doesn't feel like he can commit to the frequncy of the LSVT therapy program.    Pertinent History  Patient has low back pain for over a year that is constant. He does not ambulate with a device. He has had neck pain that is intermittent for over a year and ranges from 0/10 to 4/10.    Currently in Pain?  No/denies         [facility] PT Assessment - 09/30/19 1046      Standardized Balance Assessment   Standardized Balance Assessment  Berg Balance Test      Berg Balance Test   Sit to Stand  Able to stand without using hands and stabilize independently    Standing Unsupported  Able to stand safely 2 minutes    Sitting with Back Unsupported but Feet Supported on Floor or Stool  Able to sit safely and securely 2 minutes    Stand to Sit  Sits safely with minimal use of hands    Transfers  Able to transfer safely, minor  use of hands    Standing Unsupported with Eyes Closed  Able to stand 10 seconds safely    Standing Unsupported with Feet Together  Able to place feet together independently and stand 1 minute safely    From Standing, Reach Forward with Outstretched Arm  Can reach confidently >25 cm (10")    From Standing Position, Pick up Object from Floor  Able to pick up shoe safely and easily    From Standing Position, Turn to Look Behind Over each Shoulder  Looks behind from both sides and weight shifts well    Turn 360 Degrees  Able to turn 360 degrees safely in 4 seconds or less    Standing Unsupported, Alternately Place Feet on Step/Stool  Able to stand independently and safely and complete 8 steps in 20 seconds    Standing Unsupported, One Foot in Front  Able to plae foot ahead of the other independently and hold 30 seconds    Standing on One Leg  Able to lift leg independently and hold 5-10 seconds    Total Score  54        TREATMENT    Neuromuscular Re-education    Updated outcome measures and goals with patient during session (see below)  FUNCTIONAL OUTCOME MEASURES   Results Comments  Mini-BESTest 21/28 Increased risk for falls  BERG 54/56 Minor deficits in tandem and single leg stance  TUG 10.3 seconds WNL  Cognitive TUG 11.5 seconds WNL  5TSTS 13.3 seconds WNL  10 Meter Gait Speed Self-selected: 9.6s = 1.04 m/s; Fastest: 7.0s = 1.43 m/s WNL  ABC Scale 80% WNL          Pt has no further neck pain at this time with all activity. He is independent with his home exercise program for his neck and his posture. Outcome measures and goals updated with patient today. Overall is balance is quite good however he demonstrates some higher level balance deficits with tandem and single leg stance as well as with reaction time. He scored a 54/56 on the BERG and a 21/28 on the Mini-BESTest. His TUG, cognitive TUG, 5TSTS, 61mgait speed, and ABC are all WNL. Offered patient formal Parkinson's  LSVT program however he is unable to commit to the time required to perform this program. He would however like to continue therapy for his balance and strength. Pt will benefit from skilled PT services to address deficits in balance and decrease risk for future falls.                              PT Short Term Goals - 09/30/19 1506      PT SHORT TERM GOAL #1   Title  Patient will be independent in home exercise program to improve strength/mobility for better functional independence with ADLs.    Baseline  09/25/19: pt performing 3-4x/wk    Time  4    Period  Weeks    Status  Partially Met    Target Date  10/28/19        PT Long Term Goals - 09/30/19 1506      PT LONG TERM GOAL #1   Title  Patient will increase BLE gross strength to 4+/5 as to improve functional strength for independent gait, increased standing tolerance and increased ADL ability.    Baseline  09/25/19: R/L Hip extension: 4-/4-, Hip abduction: 4-/4-, Hip adduction 4-/4-, Ankle PF: <3/<3, (otherwise at least 4+/5 bilateral)    Time  8    Period  Weeks    Status  On-going    Target Date  11/25/19      PT LONG TERM GOAL #2   Title  Patient will be able to perform household work/ chores without increase in symptoms.    Baseline  09/25/19: Pt reports no further neck pain, able to perform all activities without pain.    Time  8    Period  Weeks    Status  Achieved      PT LONG TERM GOAL #3   Title  Patient will report a worst pain of 2/10 on VAS in   neck    to improve tolerance with ADLs and reduced symptoms with activities.    Baseline  09/25/19: 0/10, pt reports no further neck pain    Time  8    Period  Weeks    Status  Achieved      PT LONG TERM GOAL #4   Title  Pt will improve single leg balance to >20s bilaterally in order to decrease risk for falls and improve function at home.    Baseline  09/30/19: LLE: 9.8s, RLE: 16.3s    Time  8  Period  Weeks    Status  New    Target Date   11/25/19      PT LONG TERM GOAL #5   Title  Pt will improve Mini-BESTest by at least 4 points in order to demonstrate clinically significant improvement in balance and decrease risk for future falls.    Baseline  09/30/19: 21/28    Time  8    Period  Weeks    Status  New    Target Date  11/25/19            Plan - 09/30/19 1506    Clinical Impression Statement  Pt has no further neck pain at this time with all activity. He is independent with his home exercise program for his neck and his posture. Outcome measures and goals updated with patient today. Overall is balance is quite good however he demonstrates some higher level balance deficits with tandem and single leg stance as well as with reaction time. He scored a 54/56 on the BERG and a 21/28 on the Mini-BESTest. His TUG, cognitive TUG, 5TSTS, 55mgait speed, and ABC are all WNL. Offered patient formal Parkinson's LSVT program however he is unable to commit to the time required to perform this program. He would however like to continue therapy for his balance and strength. Pt will benefit from skilled PT services to address deficits in balance and decrease risk for future falls.    Personal Factors and Comorbidities  Age;Comorbidity 1    Comorbidities  Parkinsons    Examination-Activity Limitations  Caring for Others;Stand;Locomotion Level    Examination-Participation Restrictions  Yard Work;Driving    Stability/Clinical Decision Making  Stable/Uncomplicated    Rehab Potential  Fair    PT Frequency  2x / week    PT Duration  8 weeks    PT Treatment/Interventions  Cryotherapy;Electrical Stimulation;Moist Heat;Traction;Ultrasound;Therapeutic activities;Therapeutic exercise;Patient/family education;Manual techniques;Passive range of motion    PT Next Visit Plan  Balance and LE strengthening    PT Home Exercise Plan  chin tucks, scapula retraction    Consulted and Agree with Plan of Care  Patient       Patient will benefit from  skilled therapeutic intervention in order to improve the following deficits and impairments:  Pain, Decreased activity tolerance, Decreased endurance, Decreased range of motion, Decreased strength  Visit Diagnosis: Muscle weakness (generalized)  Neck pain, chronic     Problem List There are no problems to display for this patient.  JPhillips GroutPT, DPT, GCS  Tymber Stallings 09/30/2019, 3:26 PM  [place] [facility] [address] [place] [address]Phone: [phone]  Fax:  [phone] Name: Joshua VandeusenMRN: [id number] of Birth: [birth year]

## 2019-10-02 ENCOUNTER — Ambulatory Visit: Payer: Medicare PPO

## 2019-10-02 ENCOUNTER — Other Ambulatory Visit: Payer: Self-pay

## 2019-10-02 DIAGNOSIS — R490 Dysphonia: Secondary | ICD-10-CM | POA: Diagnosis not present

## 2019-10-02 DIAGNOSIS — M6281 Muscle weakness (generalized): Secondary | ICD-10-CM

## 2019-10-02 DIAGNOSIS — R2681 Unsteadiness on feet: Secondary | ICD-10-CM

## 2019-10-02 NOTE — Therapy (Signed)
[place] MAIN [facility] SERVICES [address] [place], [place], [address] Phone: [phone]   Fax:  [phone]  Physical Therapy Treatment  Patient Details  Name: Joshua George MRN: [id number] Date of Birth: 1940/09/19 Referring Provider (PT): Jennings Books   Encounter Date: 10/02/2019  PT End of Session - 10/02/19 0937    Visit Number  12    Number of Visits  33    Date for PT Re-Evaluation  11/25/19    PT Start Time  0931    PT Stop Time  1015    PT Time Calculation (min)  44 min    Equipment Utilized During Treatment  Gait belt    Activity Tolerance  Patient tolerated treatment well    Behavior During Therapy  [facility] for tasks assessed/performed       History reviewed. No pertinent past medical history.  History reviewed. No pertinent surgical history.  There were no vitals filed for this visit.  Subjective Assessment - 10/02/19 0936    Subjective  Patient stated he is doing well today, no complaints of pain. No health changes and no specific questions or concerns upon arrival.    Pertinent History  Patient has low back pain for over a year that is constant. He does not ambulate with a device. He has had neck pain that is intermittent for over a year and ranges from 0/10 to 4/10.    Currently in Pain?  No/denies         TREATMENT   Ther-ex NuStep L1-3 x 5 minutes for warmup during history with therapist adjusting resistance to match pt tolerance; Precor BLE leg press 40# x 23, 55# x 43, will continue to increase resistance in follow-up sessions; Sit to stand from regular height chair with Airex pad under feet, no UE 2 x 10;   Neuromuscular Re-education  Tandem gait on 2"x4", 4 lengths without UE support; Side stepping on 2"x4", 4 lengths without UE support; 1/2 bolster tandem balance alternating forward LE x 30s each; 1/2 bolster heel/toe rocking x 10 each direction; Airex cone taps alternating LE x 10 each; 6" orange  hurdle forward/backward stepping x 10 each side; Gait in hallway with cues from therapist to increase his step length and decrease overall number of steps required to traverse the same distance, performed approximately 75' x 4 with first time pt requiring 34 steps then 33, 32, and finally 31. Cues also provided by therapist for increased arm swing during gait;   Pt educated throughout session about proper posture and technique with exercises. Improved exercise technique, movement at target joints, use of target muscles after min to mod verbal, visual, tactile cues.   Initiated balance and strength training with patient today. He is able to perform the leg press and will continue to increase the resistance until we can find the appropriate reps/weight for greater than 70% of his 1 rep max. He demonstrates difficulty with tandem balance on the 1/2 foam roll as well as occasionally kicking the hurdle when performing forward/backward stepping. He does respond well to cues with gait in hallway to increase step length. Pt will benefit from PT services to address deficits in strength, gait, and balancein order to return to full function at home.                        PT Short Term Goals - 09/30/19 1506      PT SHORT TERM GOAL #1  Title  Patient will be independent in home exercise program to improve strength/mobility for better functional independence with ADLs.    Baseline  09/25/19: pt performing 3-4x/wk    Time  4    Period  Weeks    Status  Partially Met    Target Date  10/28/19        PT Long Term Goals - 09/30/19 1506      PT LONG TERM GOAL #1   Title  Patient will increase BLE gross strength to 4+/5 as to improve functional strength for independent gait, increased standing tolerance and increased ADL ability.    Baseline  09/25/19: R/L Hip extension: 4-/4-, Hip abduction: 4-/4-, Hip adduction 4-/4-, Ankle PF: <3/<3, (otherwise at least 4+/5 bilateral)    Time   8    Period  Weeks    Status  On-going    Target Date  11/25/19      PT LONG TERM GOAL #2   Title  Patient will be able to perform household work/ chores without increase in symptoms.    Baseline  09/25/19: Pt reports no further neck pain, able to perform all activities without pain.    Time  8    Period  Weeks    Status  Achieved      PT LONG TERM GOAL #3   Title  Patient will report a worst pain of 2/10 on VAS in   neck    to improve tolerance with ADLs and reduced symptoms with activities.    Baseline  09/25/19: 0/10, pt reports no further neck pain    Time  8    Period  Weeks    Status  Achieved      PT LONG TERM GOAL #4   Title  Pt will improve single leg balance to >20s bilaterally in order to decrease risk for falls and improve function at home.    Baseline  09/30/19: LLE: 9.8s, RLE: 16.3s    Time  8    Period  Weeks    Status  New    Target Date  11/25/19      PT LONG TERM GOAL #5   Title  Pt will improve Mini-BESTest by at least 4 points in order to demonstrate clinically significant improvement in balance and decrease risk for future falls.    Baseline  09/30/19: 21/28    Time  8    Period  Weeks    Status  New    Target Date  11/25/19            Plan - 10/02/19 0459    Clinical Impression Statement  Initiated balance and strength training with patient today. He is able to perform the leg press and will continue to increase the resistance until we can find the appropriate reps/weight for greater than 70% of his 1 rep max. He demonstrates difficulty with tandem balance on the 1/2 foam roll as well as occasionally kicking the hurdle when performing forward/backward stepping. He does respond well to cues with gait in hallway to increase step length. Pt will benefit from PT services to address deficits in strength, gait, and balance in order to return to full function at home.    Personal Factors and Comorbidities  Age;Comorbidity 1    Comorbidities  Parkinsons     Examination-Activity Limitations  Caring for Others;Stand;Locomotion Level    Examination-Participation Restrictions  Yard Work;Driving    Stability/Clinical Decision Making  Stable/Uncomplicated    Rehab Potential  Fair    PT Frequency  2x / week    PT Duration  8 weeks    PT Treatment/Interventions  Cryotherapy;Electrical Stimulation;Moist Heat;Traction;Ultrasound;Therapeutic activities;Therapeutic exercise;Patient/family education;Manual techniques;Passive range of motion    PT Next Visit Plan  Balance and LE strengthening    PT Home Exercise Plan  Medbridge Access Code: [email or web address]  (10/02/19: tandem gait, tandem balance, single leg balance)    Consulted and Agree with Plan of Care  Patient       Patient will benefit from skilled therapeutic intervention in order to improve the following deficits and impairments:  Pain, Decreased activity tolerance, Decreased endurance, Decreased range of motion, Decreased strength  Visit Diagnosis: Muscle weakness (generalized)  Unsteadiness on feet     Problem List There are no problems to display for this patient.  Phillips Grout PT, DPT, GCS  Hasset Chaviano 10/02/2019, 11:01 AM  [place] MAIN [facility] SERVICES [address] [place], [place], [address] Phone: [phone]   Fax:  [phone]  Name: Joshua George MRN: [id number] Date of Birth: 04-04-1941

## 2019-10-02 NOTE — Patient Instructions (Signed)
Access Code: [email or web address]  URL: [email or web address]  Date: 10/02/2019  Prepared by: Ria Comment   Exercises Tandem Stance - 3 reps - 30s x 3 with each foot forward hold - 2x daily - 7x weekly Tandem Walking with Counter Support - 1 minute x 3 of tandem walking next to counter hold - 2x daily - 7x weekly Single Leg Stance - 3 reps - 30s x 3 on each leg hold - 1x daily - 7x weekly

## 2019-10-09 ENCOUNTER — Ambulatory Visit: Payer: Medicare PPO

## 2019-10-09 ENCOUNTER — Other Ambulatory Visit: Payer: Self-pay

## 2019-10-09 DIAGNOSIS — R2681 Unsteadiness on feet: Secondary | ICD-10-CM

## 2019-10-09 DIAGNOSIS — M6281 Muscle weakness (generalized): Secondary | ICD-10-CM

## 2019-10-09 DIAGNOSIS — R490 Dysphonia: Secondary | ICD-10-CM | POA: Diagnosis not present

## 2019-10-09 NOTE — Therapy (Signed)
[place] MAIN [facility] SERVICES [address] [place], [place], [address] Phone: [phone]   Fax:  [phone]  Physical Therapy Treatment  Patient Details  Name: Joshua George MRN: [id number] Date of Birth: 11-Aug-1941 Referring Provider (PT): Jennings Books   Encounter Date: 10/09/2019  PT End of Session - 10/09/19 0942    Visit Number  13    Number of Visits  33    Date for PT Re-Evaluation  11/25/19    PT Start Time  0930    PT Stop Time  1015    PT Time Calculation (min)  45 min    Equipment Utilized During Treatment  Gait belt    Activity Tolerance  Patient tolerated treatment well    Behavior During Therapy  [facility] for tasks assessed/performed       History reviewed. No pertinent past medical history.  History reviewed. No pertinent surgical history.  There were no vitals filed for this visit.  Subjective Assessment - 10/09/19 0942    Subjective  Patient stated he is doing well today, no complaints of pain. No stumbles or falls since last visit. No health changes and no specific questions or concerns upon arrival.    Pertinent History  Patient has low back pain for over a year that is constant. He does not ambulate with a device. He has had neck pain that is intermittent for over a year and ranges from 0/10 to 4/10.    Currently in Pain?  No/denies          TREATMENT   Ther-ex NuStep L1-3 x 5 minutes for warmup during history with therapist adjusting resistance to match pt tolerance; Precor BLE leg press 60# x 20, 70# x 20; Precor BLE heel raises 55# 2 x 20; Sit to stand from regular height chair with Airex pad under feet, no UE 2 x 10; Seated marches with manual resistance by therapist x 10; Seated clams with manual resistance by therapist x 10; Seated adductor squeeze with manual resistance by therapist x 10;   Neuromuscular Re-education  Large forward stepping with opening of chest with big arm movements out to the  side and then thigh slap on return x 10 with each leg stepping; Large lateral stepping with opening of chest with big arm movements out to the side and then thigh slap on return x 10 with each leg stepping; Gait in hallway with cues from therapist to increase his step length and decrease overall number of steps required to traverse the same distance, performed approximately 75' x 4 with therapist counting out loud for cues to patient; 1/2 bolster (flat side up) A/P balance x 60s; 1/2 bolster (round side up) A/P balance x 60s; 1/2 bolster heel/toe rocking x 10 each direction; 1/2 bolster tandem balance alternating forward LE x 30s each;   Pt educated throughout session about proper posture and technique with exercises. Improved exercise technique, movement at target joints, use of target muscles after min to mod verbal, visual, tactile cues.   Pt demonstrates excellent motivation during session today. He tolerates increase in resistance with the single leg press. He also demonstrates improved ability to take large steps with cuing while walking in the hallway. Initiated large amplitude stepping to help with Parkinson's and patient struggles to coordinate the movements. Will continue in future sessions. Pt will benefit from PT services to address deficits in strength, gait, and balancein order to return to full function at home.  PT Short Term Goals - 09/30/19 1506      PT SHORT TERM GOAL #1   Title  Patient will be independent in home exercise program to improve strength/mobility for better functional independence with ADLs.    Baseline  09/25/19: pt performing 3-4x/wk    Time  4    Period  Weeks    Status  Partially Met    Target Date  10/28/19        PT Long Term Goals - 09/30/19 1506      PT LONG TERM GOAL #1   Title  Patient will increase BLE gross strength to 4+/5 as to improve functional strength for independent gait, increased  standing tolerance and increased ADL ability.    Baseline  09/25/19: R/L Hip extension: 4-/4-, Hip abduction: 4-/4-, Hip adduction 4-/4-, Ankle PF: <3/<3, (otherwise at least 4+/5 bilateral)    Time  8    Period  Weeks    Status  On-going    Target Date  11/25/19      PT LONG TERM GOAL #2   Title  Patient will be able to perform household work/ chores without increase in symptoms.    Baseline  09/25/19: Pt reports no further neck pain, able to perform all activities without pain.    Time  8    Period  Weeks    Status  Achieved      PT LONG TERM GOAL #3   Title  Patient will report a worst pain of 2/10 on VAS in   neck    to improve tolerance with ADLs and reduced symptoms with activities.    Baseline  09/25/19: 0/10, pt reports no further neck pain    Time  8    Period  Weeks    Status  Achieved      PT LONG TERM GOAL #4   Title  Pt will improve single leg balance to >20s bilaterally in order to decrease risk for falls and improve function at home.    Baseline  09/30/19: LLE: 9.8s, RLE: 16.3s    Time  8    Period  Weeks    Status  New    Target Date  11/25/19      PT LONG TERM GOAL #5   Title  Pt will improve Mini-BESTest by at least 4 points in order to demonstrate clinically significant improvement in balance and decrease risk for future falls.    Baseline  09/30/19: 21/28    Time  8    Period  Weeks    Status  New    Target Date  11/25/19            Plan - 10/09/19 0943    Clinical Impression Statement  Pt demonstrates excellent motivation during session today. He tolerates increase in resistance with the single leg press. He also demonstrates improved ability to take large steps with cuing while walking in the hallway. Initiated large amplitude stepping to help with Parkinson's and patient struggles to coordinate the movements. Will continue in future sessions. Pt will benefit from PT services to address deficits in strength, gait, and balance in order to return to full  function at home.    Personal Factors and Comorbidities  Age;Comorbidity 1    Comorbidities  Parkinsons    Examination-Activity Limitations  Caring for Others;Stand;Locomotion Level    Examination-Participation Restrictions  Yard Work;Driving    Stability/Clinical Decision Making  Stable/Uncomplicated    Rehab Potential  Fair  PT Frequency  2x / week    PT Duration  8 weeks    PT Treatment/Interventions  Cryotherapy;Electrical Stimulation;Moist Heat;Traction;Ultrasound;Therapeutic activities;Therapeutic exercise;Patient/family education;Manual techniques;Passive range of motion    PT Next Visit Plan  Balance and LE strengthening    PT Home Exercise Plan  Medbridge Access Code: [email or web address]  (10/02/19: tandem gait, tandem balance, single leg balance)    Consulted and Agree with Plan of Care  Patient       Patient will benefit from skilled therapeutic intervention in order to improve the following deficits and impairments:  Pain, Decreased activity tolerance, Decreased endurance, Decreased range of motion, Decreased strength  Visit Diagnosis: Muscle weakness (generalized)  Unsteadiness on feet     Problem List There are no problems to display for this patient.  Phillips Grout PT, DPT, GCS  Briena Swingler 10/09/2019, 1:21 PM  [place] MAIN [facility] SERVICES [address] [place], [place], [address] Phone: [phone]   Fax:  [phone]  Name: Joshua George MRN: [id number] Date of Birth: 1941/07/27

## 2019-10-15 ENCOUNTER — Ambulatory Visit: Payer: Medicare PPO | Attending: Neurology

## 2019-10-15 ENCOUNTER — Other Ambulatory Visit: Payer: Self-pay

## 2019-10-15 DIAGNOSIS — R2681 Unsteadiness on feet: Secondary | ICD-10-CM | POA: Insufficient documentation

## 2019-10-15 DIAGNOSIS — G8929 Other chronic pain: Secondary | ICD-10-CM | POA: Insufficient documentation

## 2019-10-15 DIAGNOSIS — M542 Cervicalgia: Secondary | ICD-10-CM | POA: Diagnosis present

## 2019-10-15 DIAGNOSIS — R490 Dysphonia: Secondary | ICD-10-CM | POA: Diagnosis present

## 2019-10-15 DIAGNOSIS — M6281 Muscle weakness (generalized): Secondary | ICD-10-CM | POA: Diagnosis present

## 2019-10-15 NOTE — Therapy (Signed)
[place] MAIN [facility] SERVICES [address] [place], [place], [address] Phone: [phone]   Fax:  [phone]  Physical Therapy Treatment  Patient Details  Name: Joshua George MRN: [id number] Date of Birth: 25-Nov-1940 Referring Provider (PT): Jennings Books   Encounter Date: 10/15/2019  PT End of Session - 10/15/19 1107    Visit Number  14    Number of Visits  33    Date for PT Re-Evaluation  11/25/19    PT Start Time  1102    PT Stop Time  1145    PT Time Calculation (min)  43 min    Equipment Utilized During Treatment  Gait belt    Activity Tolerance  Patient tolerated treatment well    Behavior During Therapy  [facility] for tasks assessed/performed       History reviewed. No pertinent past medical history.  History reviewed. No pertinent surgical history.  There were no vitals filed for this visit.  Subjective Assessment - 10/15/19 1106    Subjective  Patient stated he is doing well today, no complaints of pain. No stumbles or falls since last visit. No health changes and no specific questions or concerns upon arrival.    Pertinent History  Patient has low back pain for over a year that is constant. He does not ambulate with a device. He has had neck pain that is intermittent for over a year and ranges from 0/10 to 4/10.    Currently in Pain?  No/denies          TREATMENT   Ther-ex NuStep L1-3 x 5 minutes for warmup during history with therapist adjusting resistance to match pt tolerance; Sit to stand from regular height chair with Airex pad under feet, no UE 2 x 10; Precor BLE leg press 70# x 20, 85# x 20; Precor BLE heel raises 55# x 20;   Neuromuscular Re-education Seated dynamic reaching in front, down, overhead, out, and ending with a thigh slap x 10; Large forward stepping with opening of chest with big arm movements out to the side and then thigh slap on return x 10 with each leg stepping; Large lateral stepping with  opening of chest with big arm movements out to the side and then thigh slap on return x 10 with each leg stepping; Gait in hallway with cues from therapist to increase his step length and decrease overall number of steps required to traverse the same distance, performed approximately 75' x 4 with therapist counting out loud for cues to patient; 1/2 bolster (flat side up) A/P balance 30s x 2; 1/2 bolster heel/toe rocking x 10 each direction; 1/2 bolster tandem balance alternating forward LE 30s x 2 each;   Pt educated throughout session about proper posture and technique with exercises. Improved exercise technique, movement at target joints, use of target muscles after min to mod verbal, visual, tactile cues.   Pt demonstrates excellent motivation during session today. He tolerates increase in resistance with the leg press today. He also demonstrates improved ability to take large steps with cuing while walking in the hallway Continued with large amplitude reaching and stepping to help with Parkinson's Patient struggles to coordinate the movements but improved from last session. Will continue in future sessions. Pt will benefit from PT services to address deficits in strength, gait, and balancein order to return to full function at home.  PT Short Term Goals - 09/30/19 1506      PT SHORT TERM GOAL #1   Title  Patient will be independent in home exercise program to improve strength/mobility for better functional independence with ADLs.    Baseline  09/25/19: pt performing 3-4x/wk    Time  4    Period  Weeks    Status  Partially Met    Target Date  10/28/19        PT Long Term Goals - 09/30/19 1506      PT LONG TERM GOAL #1   Title  Patient will increase BLE gross strength to 4+/5 as to improve functional strength for independent gait, increased standing tolerance and increased ADL ability.    Baseline  09/25/19: R/L Hip extension: 4-/4-, Hip  abduction: 4-/4-, Hip adduction 4-/4-, Ankle PF: <3/<3, (otherwise at least 4+/5 bilateral)    Time  8    Period  Weeks    Status  On-going    Target Date  11/25/19      PT LONG TERM GOAL #2   Title  Patient will be able to perform household work/ chores without increase in symptoms.    Baseline  09/25/19: Pt reports no further neck pain, able to perform all activities without pain.    Time  8    Period  Weeks    Status  Achieved      PT LONG TERM GOAL #3   Title  Patient will report a worst pain of 2/10 on VAS in   neck    to improve tolerance with ADLs and reduced symptoms with activities.    Baseline  09/25/19: 0/10, pt reports no further neck pain    Time  8    Period  Weeks    Status  Achieved      PT LONG TERM GOAL #4   Title  Pt will improve single leg balance to >20s bilaterally in order to decrease risk for falls and improve function at home.    Baseline  09/30/19: LLE: 9.8s, RLE: 16.3s    Time  8    Period  Weeks    Status  New    Target Date  11/25/19      PT LONG TERM GOAL #5   Title  Pt will improve Mini-BESTest by at least 4 points in order to demonstrate clinically significant improvement in balance and decrease risk for future falls.    Baseline  09/30/19: 21/28    Time  8    Period  Weeks    Status  New    Target Date  11/25/19            Plan - 10/15/19 1108    Clinical Impression Statement  Pt demonstrates excellent motivation during session today. He tolerates increase in resistance with the leg press today. He also demonstrates improved ability to take large steps with cuing while walking in the hallway Continued with large amplitude reaching and stepping to help with Parkinson's Patient struggles to coordinate the movements but improved from last session. Will continue in future sessions. Pt will benefit from PT services to address deficits in strength, gait, and balance in order to return to full function at home.    Personal Factors and Comorbidities   Age;Comorbidity 1    Comorbidities  Parkinsons    Examination-Activity Limitations  Caring for Others;Stand;Locomotion Level    Examination-Participation Restrictions  Yard Work;Driving    Stability/Clinical Decision Making  Stable/Uncomplicated  Clinical Decision Making  Low    Rehab Potential  Fair    PT Frequency  2x / week    PT Duration  8 weeks    PT Treatment/Interventions  Cryotherapy;Electrical Stimulation;Moist Heat;Traction;Ultrasound;Therapeutic activities;Therapeutic exercise;Patient/family education;Manual techniques;Passive range of motion    PT Next Visit Plan  Balance and LE strengthening    PT Home Exercise Plan  Medbridge Access Code: [email or web address]  (10/02/19: tandem gait, tandem balance, single leg balance)    Consulted and Agree with Plan of Care  Patient       Patient will benefit from skilled therapeutic intervention in order to improve the following deficits and impairments:  Pain, Decreased activity tolerance, Decreased endurance, Decreased range of motion, Decreased strength  Visit Diagnosis: Muscle weakness (generalized)  Unsteadiness on feet     Problem List There are no problems to display for this patient.  Phillips Grout PT, DPT, GCS  Joshua George 10/15/2019, 12:34 PM  [place] MAIN [facility] SERVICES [address] [place], [place], [address] Phone: [phone]   Fax:  [phone]  Name: Joshua George MRN: [id number] Date of Birth: 1941-06-06

## 2019-10-17 ENCOUNTER — Ambulatory Visit: Payer: Medicare PPO | Admitting: Speech Pathology

## 2019-10-17 ENCOUNTER — Ambulatory Visit: Payer: Medicare PPO

## 2019-10-17 ENCOUNTER — Other Ambulatory Visit: Payer: Self-pay

## 2019-10-17 DIAGNOSIS — M6281 Muscle weakness (generalized): Secondary | ICD-10-CM | POA: Diagnosis not present

## 2019-10-17 DIAGNOSIS — R2681 Unsteadiness on feet: Secondary | ICD-10-CM

## 2019-10-17 DIAGNOSIS — R490 Dysphonia: Secondary | ICD-10-CM

## 2019-10-17 NOTE — Therapy (Signed)
[place] [facility] MAIN [facility] SERVICES [address] [place], [place], [address] Phone: [phone]   Fax:  [phone]  Patient Details  Name: Joshua George MRN: [id number] Date of Birth: 1941-05-01 Referring Provider:  Lonell Face, MD  Encounter Date: 10/17/2019   The patient did not remember that he had a speech therapy session following his PT.  Will follow up at his next scheduled appointment.  Dollene Primrose, MS/CCC- SLP  Leandrew Koyanagi 10/17/2019, 11:21 AM  [place] [facility] MAIN [facility] SERVICES [address] [place], [place], [address] Phone: [phone]   Fax:  [phone]

## 2019-10-17 NOTE — Therapy (Signed)
[place] MAIN [facility] SERVICES [address] [place], [place], [address] Phone: [phone]   Fax:  [phone]  Physical Therapy Treatment  Patient Details  Name: Joshua George MRN: [id number] Date of Birth: 1940-11-20 Referring Provider (PT): Jennings Books   Encounter Date: 10/17/2019  PT End of Session - 10/17/19 1431    Visit Number  15    Number of Visits  33    Date for PT Re-Evaluation  11/25/19    PT Start Time  8185    PT Stop Time  1100    PT Time Calculation (min)  45 min    Equipment Utilized During Treatment  Gait belt    Activity Tolerance  Patient tolerated treatment well    Behavior During Therapy  [facility] for tasks assessed/performed       History reviewed. No pertinent past medical history.  History reviewed. No pertinent surgical history.  There were no vitals filed for this visit.  Subjective Assessment - 10/17/19 1023    Subjective  Patient stated he is doing well today, no complaints of pain. No stumbles or falls since last visit. He received his second COVID vaccination this morning. No specific questions or concerns upon arrival.    Pertinent History  Patient has low back pain for over a year that is constant. He does not ambulate with a device. He has had neck pain that is intermittent for over a year and ranges from 0/10 to 4/10.    Currently in Pain?  No/denies         TREATMENT   Ther-ex NuStep L2 x 5 minutes for warmup during history with therapist adjusting resistance to match pt tolerance; Sit to stand from regular height chair with Airex pad under feet, no UE support 2 x 10; Precor BLE leg press 75# x 20, 90# x 20, 100# x 20; Precor BLEheel raises 60#x 20; Standing heel raises without UE support x 20;   Neuromuscular Re-education Seated dynamic reaching in front, down, overhead, out, and ending with a thigh slap x 10; Large forward stepping with opening of chest with big arm movements out to  the side and then thigh slap on return x 10 with each leg stepping; Large lateral stepping with opening of chest with big arm movements out to the side and then thigh slap on return x 10 with each leg stepping; 6" orange hurdle forward stepping alternating leading LE x 10 on each side; 6" orange lateral steps x 10 each direction; Rockerboard A/P orientation balance without UE support 60s; Rockerboard A/P orientation heel/toe rocking x 10 each direction without UE support; Rockerboard R/L orientation balance without UE support 60s; Rockerboard R/L orientation right and left weight shifting without UE support 60s;   Pt educated throughout session about proper posture and technique with exercises. Improved exercise technique, movement at target joints, use of target muscles after min to mod verbal, visual, tactile cues.   Pt demonstrates excellent motivation during session today. He tolerates increase in resistance with the leg press today. He continues to struggle with multi-step directions during exercise such as coordinating simultaneous UE/LE movements. However he does demonstrate larger steps today with forward lunging. Will continue to work on large amplitude movements in future sessions.Pt will benefit from PT services to address deficits in strength, gait, and balancein order to return to full function at home.  PT Short Term Goals - 09/30/19 1506      PT SHORT TERM GOAL #1   Title  Patient will be independent in home exercise program to improve strength/mobility for better functional independence with ADLs.    Baseline  09/25/19: pt performing 3-4x/wk    Time  4    Period  Weeks    Status  Partially Met    Target Date  10/28/19        PT Long Term Goals - 09/30/19 1506      PT LONG TERM GOAL #1   Title  Patient will increase BLE gross strength to 4+/5 as to improve functional strength for independent gait, increased standing  tolerance and increased ADL ability.    Baseline  09/25/19: R/L Hip extension: 4-/4-, Hip abduction: 4-/4-, Hip adduction 4-/4-, Ankle PF: <3/<3, (otherwise at least 4+/5 bilateral)    Time  8    Period  Weeks    Status  On-going    Target Date  11/25/19      PT LONG TERM GOAL #2   Title  Patient will be able to perform household work/ chores without increase in symptoms.    Baseline  09/25/19: Pt reports no further neck pain, able to perform all activities without pain.    Time  8    Period  Weeks    Status  Achieved      PT LONG TERM GOAL #3   Title  Patient will report a worst pain of 2/10 on VAS in   neck    to improve tolerance with ADLs and reduced symptoms with activities.    Baseline  09/25/19: 0/10, pt reports no further neck pain    Time  8    Period  Weeks    Status  Achieved      PT LONG TERM GOAL #4   Title  Pt will improve single leg balance to >20s bilaterally in order to decrease risk for falls and improve function at home.    Baseline  09/30/19: LLE: 9.8s, RLE: 16.3s    Time  8    Period  Weeks    Status  New    Target Date  11/25/19      PT LONG TERM GOAL #5   Title  Pt will improve Mini-BESTest by at least 4 points in order to demonstrate clinically significant improvement in balance and decrease risk for future falls.    Baseline  09/30/19: 21/28    Time  8    Period  Weeks    Status  New    Target Date  11/25/19            Plan - 10/17/19 1432    Clinical Impression Statement  Pt demonstrates excellent motivation during session today. He tolerates increase in resistance with the leg press today. He continues to struggle with multi-step directions during exercise such as coordinating simultaneous UE/LE movements. However he does demonstrate larger steps today with forward lunging. Will continue to work on large amplitude movements in future sessions. Pt will benefit from PT services to address deficits in strength, gait, and balance in order to return to  full function at home.    Personal Factors and Comorbidities  Age;Comorbidity 1    Comorbidities  Parkinsons    Examination-Activity Limitations  Caring for Others;Stand;Locomotion Level    Examination-Participation Restrictions  Yard Work;Driving    Stability/Clinical Decision Making  Stable/Uncomplicated    Rehab Potential  Fair  PT Frequency  2x / week    PT Duration  8 weeks    PT Treatment/Interventions  Cryotherapy;Electrical Stimulation;Moist Heat;Traction;Ultrasound;Therapeutic activities;Therapeutic exercise;Patient/family education;Manual techniques;Passive range of motion    PT Next Visit Plan  Balance and LE strengthening    PT Home Exercise Plan  Medbridge Access Code: [email or web address]  (10/02/19: tandem gait, tandem balance, single leg balance)    Consulted and Agree with Plan of Care  Patient       Patient will benefit from skilled therapeutic intervention in order to improve the following deficits and impairments:  Pain, Decreased activity tolerance, Decreased endurance, Decreased range of motion, Decreased strength  Visit Diagnosis: Muscle weakness (generalized)  Unsteadiness on feet     Problem List There are no problems to display for this patient.  Phillips Grout PT, DPT, GCS  Tiara Bartoli 10/18/2019, 9:00 AM  [place] MAIN [facility] SERVICES [address] [place], [place], [address] Phone: [phone]   Fax:  [phone]  Name: Joshua George MRN: [id number] Date of Birth: 07-Apr-1941

## 2019-10-21 ENCOUNTER — Other Ambulatory Visit: Payer: Self-pay

## 2019-10-21 ENCOUNTER — Encounter: Payer: Self-pay | Admitting: Speech Pathology

## 2019-10-21 ENCOUNTER — Ambulatory Visit: Payer: Medicare PPO | Admitting: Speech Pathology

## 2019-10-21 ENCOUNTER — Ambulatory Visit: Payer: Medicare PPO

## 2019-10-21 DIAGNOSIS — M6281 Muscle weakness (generalized): Secondary | ICD-10-CM | POA: Diagnosis not present

## 2019-10-21 DIAGNOSIS — R490 Dysphonia: Secondary | ICD-10-CM

## 2019-10-21 DIAGNOSIS — R2681 Unsteadiness on feet: Secondary | ICD-10-CM

## 2019-10-21 NOTE — Therapy (Signed)
[place] MAIN [facility] SERVICES [address] [place], [place], [address] Phone: [phone]   Fax:  [phone]  Physical Therapy Treatment  Patient Details  Name: Joshua George MRN: [id number] Date of Birth: 01-02-1941 Referring Provider (PT): Jennings Books   Encounter Date: 10/21/2019  PT End of Session - 10/21/19 1312    Visit Number  16    Number of Visits  33    Date for PT Re-Evaluation  11/25/19    PT Start Time  1301    PT Stop Time  1345    PT Time Calculation (min)  44 min    Equipment Utilized During Treatment  Gait belt    Activity Tolerance  Patient tolerated treatment well    Behavior During Therapy  [facility] for tasks assessed/performed       History reviewed. No pertinent past medical history.  History reviewed. No pertinent surgical history.  There were no vitals filed for this visit.  Subjective Assessment - 10/21/19 1311    Subjective  Patient stated he is doing well today, no complaints of pain. No stumbles or falls since last visit. No specific questions or concerns upon arrival.    Pertinent History  Patient has low back pain for over a year that is constant. He does not ambulate with a device. He has had neck pain that is intermittent for over a year and ranges from 0/10 to 4/10.    Currently in Pain?  No/denies            TREATMENT   Ther-ex Octane xRide L3/6 intervals, 45s each x 5 minutes total for warmup during history with therapist (4 minutes unbilled); Sit to stand from regular height chair with Airex pad under feet, no UE support 2 x 10; Precor BLE leg press 85# 2 x 20; TRX squats with glut to chair 2 x 15 Standing heel raises with UE support x 20; Standing hip 4 ways with 3# ankle weights: marching, hip abduction, hip extension, HS curls x 15 each;   Neuromuscular Re-education Seated dynamic reaching in front, down, overhead, out, and ending with a thigh slap x 10; Large forward stepping with  opening of chest with big arm movements out to the side and then thigh slap on return x 10 with each leg stepping; Large lateral stepping with opening of chest with big arm movements out to the side and then thigh slap on return x 10 with each leg stepping; Gait in hallway with cues from therapist to increase his step length and decrease overall number of steps required to traverse the same distance, performed approximately 75' x 5with therapist counting out loud for cues to patient; Backwards gait with therapist providing cues for increased step length 75' x 2; Side stepping with therapist providing cues for increased step length 35' x 2 each direction;   Pt educated throughout session about proper posture and technique with exercises. Improved exercise technique, movement at target joints, use of target muscles after min to mod verbal, visual, tactile cues.   Pt demonstrates excellent motivation during session today. He has to regress slightly in weight on the leg press as he struggles when attempting 100#. He continues to struggle with multi-step directions during exercise such as coordinating simultaneous UE/LE movements. However continues to demonstrate larger steps today with forward lunging as well as during forward gait. He does struggle with increased shuffling during backwards and latearl stepping. Will continue to work on large amplitude movements in future  sessions.Pt will benefit from PT services to address deficits in strength, gait, and balancein order to return to full function at home.              PT Short Term Goals - 09/30/19 1506      PT SHORT TERM GOAL #1   Title  Patient will be independent in home exercise program to improve strength/mobility for better functional independence with ADLs.    Baseline  09/25/19: pt performing 3-4x/wk    Time  4    Period  Weeks    Status  Partially Met    Target Date  10/28/19        PT Long Term Goals - 09/30/19  1506      PT LONG TERM GOAL #1   Title  Patient will increase BLE gross strength to 4+/5 as to improve functional strength for independent gait, increased standing tolerance and increased ADL ability.    Baseline  09/25/19: R/L Hip extension: 4-/4-, Hip abduction: 4-/4-, Hip adduction 4-/4-, Ankle PF: <3/<3, (otherwise at least 4+/5 bilateral)    Time  8    Period  Weeks    Status  On-going    Target Date  11/25/19      PT LONG TERM GOAL #2   Title  Patient will be able to perform household work/ chores without increase in symptoms.    Baseline  09/25/19: Pt reports no further neck pain, able to perform all activities without pain.    Time  8    Period  Weeks    Status  Achieved      PT LONG TERM GOAL #3   Title  Patient will report a worst pain of 2/10 on VAS in   neck    to improve tolerance with ADLs and reduced symptoms with activities.    Baseline  09/25/19: 0/10, pt reports no further neck pain    Time  8    Period  Weeks    Status  Achieved      PT LONG TERM GOAL #4   Title  Pt will improve single leg balance to >20s bilaterally in order to decrease risk for falls and improve function at home.    Baseline  09/30/19: LLE: 9.8s, RLE: 16.3s    Time  8    Period  Weeks    Status  New    Target Date  11/25/19      PT LONG TERM GOAL #5   Title  Pt will improve Mini-BESTest by at least 4 points in order to demonstrate clinically significant improvement in balance and decrease risk for future falls.    Baseline  09/30/19: 21/28    Time  8    Period  Weeks    Status  New    Target Date  11/25/19            Plan - 10/21/19 1312    Clinical Impression Statement  Pt demonstrates excellent motivation during session today. He has to regress slightly in weight on the leg press as he struggles when attempting 100#. He continues to struggle with multi-step directions during exercise such as coordinating simultaneous UE/LE movements. However continues to demonstrate larger steps today  with forward lunging as well as during forward gait. He does struggle with increased shuffling during backwards and latearl stepping. Will continue to work on large amplitude movements in future sessions. Pt will benefit from PT services to address deficits in strength, gait, and balance in  order to return to full function at home.    Personal Factors and Comorbidities  Age;Comorbidity 1    Comorbidities  Parkinsons    Examination-Activity Limitations  Caring for Others;Stand;Locomotion Level    Examination-Participation Restrictions  Yard Work;Driving    Stability/Clinical Decision Making  Stable/Uncomplicated    Rehab Potential  Fair    PT Frequency  2x / week    PT Duration  8 weeks    PT Treatment/Interventions  Cryotherapy;Electrical Stimulation;Moist Heat;Traction;Ultrasound;Therapeutic activities;Therapeutic exercise;Patient/family education;Manual techniques;Passive range of motion    PT Next Visit Plan  Balance and LE strengthening    PT Home Exercise Plan  Medbridge Access Code: [email or web address]  (10/02/19: tandem gait, tandem balance, single leg balance)    Consulted and Agree with Plan of Care  Patient       Patient will benefit from skilled therapeutic intervention in order to improve the following deficits and impairments:  Pain, Decreased activity tolerance, Decreased endurance, Decreased range of motion, Decreased strength  Visit Diagnosis: Muscle weakness (generalized)  Unsteadiness on feet     Problem List There are no problems to display for this patient.  Phillips Grout PT, DPT, GCS  Duglas Heier 10/21/2019, 1:51 PM  [place] MAIN [facility] SERVICES [address] [place], [place], [address] Phone: [phone]   Fax:  [phone]  Name: Gabrian Hoque MRN: [id number] Date of Birth: [birth year]

## 2019-10-21 NOTE — Therapy (Signed)
[place] MAIN [facility] SERVICES [address] [place], [place], [address] Phone: [phone]   Fax:  [phone]  Speech Language Pathology Treatment  Patient Details  Name: Joshua George MRN: [id number] Date of Birth: [birth year] Referring Provider (SLP): Dr. Manuella Ghazi   Encounter Date: 10/21/2019  End of Session - 10/21/19 1623    Visit Number  8    Number of Visits  17    Date for SLP Re-Evaluation  11/15/19    Authorization Type  Medicare    Authorization Time Period  Start 07/24/2019    Authorization - Visit Number  8    Authorization - Number of Visits  10    SLP Start Time  0200    SLP Stop Time   0250    SLP Time Calculation (min)  50 min    Activity Tolerance  Patient tolerated treatment well       History reviewed. No pertinent past medical history.  History reviewed. No pertinent surgical history.  There were no vitals filed for this visit.  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  Patient was asked to read 50 sentences and was able to properly produce 90% of sentences with proper airflow and volume. Patient was asked to engage in a spontaneous speech task and demonstrated a noted decrease in volume as task progressed. Patient was then asked to read sentences of increased length and patient was indpendent in loud production of 90% of sentences.  Patient was prompted to identify incongruencies in photos and noted decreased volume across task, with flow phonation prompt, was able to regain lost volume.       Assessment / Recommendations / Plan   Plan  Continue with current plan of care      Progression Toward Goals   Progression toward goals  Progressing toward goals       SLP Education - 10/21/19 1623    Education Details  Voice building exercises    Person(s) Educated  Patient    Methods  Explanation    Comprehension  Verbalized understanding         SLP Long Term Goals - 10/22/19 0858      SLP LONG TERM GOAL #1   Title  The patient will demonstrate independent understanding of vocal hygiene concepts and extrinsic laryngeal muscle stretches.    Time  4    Period  Weeks    Status  Partially Met    Target Date  11/15/19      SLP LONG TERM GOAL #2   Title  The patient will be independent for abdominal breathing and breath support exercises.    Time  4    Period  Weeks    Status  Partially Met    Target Date  11/15/19  SLP LONG TERM GOAL #3   Title  The patient will maximize voice quality and loudness using breath support/oral resonance for sustained vowel production, pitch glides, and hierarchal speech drill.    Time  4    Period  Weeks    Status  Partially Met    Target Date  11/15/19      SLP LONG TERM GOAL #4   Title  The patient will maximize voice quality and loudness using breath support/oral resonance for paragraph length recitation with 80% accuracy.    Time  4    Period  Weeks    Status  Partially Met    Target Date  11/15/19       Plan - 10/21/19 1624    Clinical Impression Statement  The patient demonstrates understanding of flow phonation with good mastery of unarticulated/unvoiced airflow, articulated/unvoiced speech, unarticulated/voiced airflow, and articulated/voiced speech.   He has been given instruction in generating louder voice with out strain for voice strengthening. Noted fatigue  as session progressed. Patient demonstrates understanding of a louder voice in comparison to a softer voice and is able to identify the differences in his own speech.    Speech Therapy Frequency  2x / week    Duration  Other (comment)    Treatment/Interventions  Other (comment);Patient/family education    Potential to Achieve Goals  Good    Potential Considerations  Ability to learn/carryover information;Previous level of function;Co-morbidities;Severity of impairments;Cooperation/participation level;Medical prognosis;Family/community support    SLP Home Exercise Plan  Provided    Consulted and Agree with Plan of Care  Patient       Patient will benefit from skilled therapeutic intervention in order to improve the following deficits and impairments:   Dysphonia    Problem List There are no problems to display for this patient.   [name] 10/22/2019, 9:02 AM  [place] MAIN [facility] SERVICES [place], [place], [address] Phone: [phone]   Fax:  [phone]   Name: Joshua George MRN: [id number] Date of Birth: 02/16/1941

## 2019-10-24 ENCOUNTER — Other Ambulatory Visit: Payer: Self-pay

## 2019-10-24 ENCOUNTER — Encounter: Payer: Self-pay | Admitting: Speech Pathology

## 2019-10-24 ENCOUNTER — Ambulatory Visit: Payer: Medicare PPO | Admitting: Speech Pathology

## 2019-10-24 DIAGNOSIS — R490 Dysphonia: Secondary | ICD-10-CM

## 2019-10-24 DIAGNOSIS — M6281 Muscle weakness (generalized): Secondary | ICD-10-CM | POA: Diagnosis not present

## 2019-10-24 NOTE — Therapy (Signed)
[place] MAIN [facility] SERVICES [address] [place], [place], [address] Phone: [phone]   Fax:  [phone]  Speech Language Pathology Treatment  Patient Details  Name: Joshua George MRN: [id number] Date of Birth: [birth year] Referring Provider (SLP): Dr. Manuella Ghazi   Encounter Date: 10/24/2019  End of Session - 10/24/19 1606    Visit Number  9    Number of Visits  17    Date for SLP Re-Evaluation  11/15/19    Authorization Type  Medicare    Authorization Time Period  Start 07/24/2019    Authorization - Visit Number  9    Authorization - Number of Visits  10    SLP Start Time  0200    SLP Stop Time   0250    SLP Time Calculation (min)  50 min    Activity Tolerance  Patient tolerated treatment well       History reviewed. No pertinent past medical history.  History reviewed. No pertinent surgical history.  There were no vitals filed for this visit.  Subjective Assessment - 10/24/19 1606    Subjective  patient in good spirits    Currently in Pain?  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 Patient produced /a/ and a pitch variation task with excellent volume. Patient was asked to read 20 sentences with variation in emphasis. Patient needed prompting to re-do emphasis on the first word, but performed all other tasks easily. Given 50 setences which vary by one sound (i.e. "that is mine, that is fine") patient was 100% accurate in  variation of pitch. Patient struggled with the reading task because of double vision, and noted decreased volume across task.  Patient was asked to engage in a spontaneous speech task and noted decrease in volume as task progressed. When prompted to raise voice, patient was able to do so, but unable to maintain without maximal cues from clinician.     Assessment / Recommendations / Plan   Plan  Continue with current plan of care      Progression Toward Goals   Progression toward goals  Progressing toward goals       SLP Education - 10/24/19 1606    Education Details  voice building exercises    Person(s) Educated  Patient    Methods  Explanation    Comprehension  Verbalized understanding         SLP Long Term Goals - 10/22/19 0858      SLP LONG TERM GOAL #1   Title  The patient will demonstrate independent understanding of vocal hygiene concepts and extrinsic laryngeal muscle stretches.    Time  4    Period  Weeks    Status  Partially Met    Target Date  11/15/19      SLP LONG TERM GOAL #2   Title  The patient will be independent for abdominal breathing and  breath support exercises.    Time  4    Period  Weeks    Status  Partially Met    Target Date  11/15/19      SLP LONG TERM GOAL #3   Title  The patient will maximize voice quality and loudness using breath support/oral resonance for sustained vowel production, pitch glides, and hierarchal speech drill.    Time  4    Period  Weeks    Status  Partially Met    Target Date  11/15/19      SLP LONG TERM GOAL #4   Title  The patient will maximize voice quality and loudness using breath support/oral resonance for paragraph length recitation with 80% accuracy.    Time  4    Period  Weeks    Status  Partially Met    Target Date  11/15/19       Plan - 10/24/19 1607    Clinical Impression Statement  The patient demonstrates understanding of flow phonation with good mastery of unarticulated/unvoiced airflow,  articulated/unvoiced speech, unarticulated/voiced airflow, and articulated/voiced speech.   He has been given instruction in generating louder voice with out strain for voice strengthening. Noted fatigue as session progressed. Patient demonstrates understanding of a louder voice in comparison to a softer voice and is able to identify the differences in his own speech. The patient is very stimulable for a louder voice.    Speech Therapy Frequency  2x / week    Duration  Other (comment)    Treatment/Interventions  Other (comment);Patient/family education    Potential to Achieve Goals  Good    Potential Considerations  Ability to learn/carryover information;Previous level of function;Co-morbidities;Severity of impairments;Cooperation/participation level;Medical prognosis;Family/community support    SLP Home Exercise Plan  Provided    Consulted and Agree with Plan of Care  Patient       Patient will benefit from skilled therapeutic intervention in order to improve the following deficits and impairments:   Dysphonia    Problem List There are no problems to display for this patient.   Chaniah Cisse 10/24/2019, 4:07 PM  [place] MA[place] [facility] SERVICES [address] [place], [place], [address] Phone: [phone]   Fax:  [phone]   Name: Berkley Cronkright MRN: [id number] Date of Birth: 17-Dec-1940

## 2019-10-28 ENCOUNTER — Other Ambulatory Visit: Payer: Self-pay

## 2019-10-28 ENCOUNTER — Ambulatory Visit: Payer: Medicare PPO

## 2019-10-28 ENCOUNTER — Ambulatory Visit: Payer: Medicare PPO | Admitting: Speech Pathology

## 2019-10-28 DIAGNOSIS — M6281 Muscle weakness (generalized): Secondary | ICD-10-CM | POA: Diagnosis not present

## 2019-10-28 DIAGNOSIS — G8929 Other chronic pain: Secondary | ICD-10-CM

## 2019-10-28 DIAGNOSIS — R490 Dysphonia: Secondary | ICD-10-CM

## 2019-10-28 DIAGNOSIS — M542 Cervicalgia: Secondary | ICD-10-CM

## 2019-10-28 DIAGNOSIS — R2681 Unsteadiness on feet: Secondary | ICD-10-CM

## 2019-10-28 NOTE — Therapy (Signed)
[place] MAIN [facility] SERVICES [address] [place], [place], [address] Phone: [phone]   Fax:  [phone]  Physical Therapy Treatment  Patient Details  Name: Joshua George MRN: [id number] Date of Birth: 1941-09-01 Referring Provider (PT): Jennings Books   Encounter Date: 10/28/2019  PT End of Session - 10/28/19 1305    Visit Number  17    Number of Visits  33    Date for PT Re-Evaluation  11/25/19    PT Start Time  1300    PT Stop Time  1340    PT Time Calculation (min)  40 min    Equipment Utilized During Treatment  Gait belt    Activity Tolerance  Patient tolerated treatment well;No increased pain    Behavior During Therapy  [facility] for tasks assessed/performed       No past medical history on file.  No past surgical history on file.  There were no vitals filed for this visit.  Subjective Assessment - 10/28/19 1304    Subjective  Pt doing well today. Denies any pain. reports he had no freezing rain at home and thus no major problems with power at the house. HEP is going well when he remembers to make time to do it.    Pertinent History  Patient has low back pain for over a year that is constant. He does not ambulate with a device. He has had neck pain that is intermittent for over a year and ranges from 0/10 to 4/10.    Currently in Pain?  No/denies      INTERVENTION THIS DATE  Ther-ex/Neuromuscular Re-education  Intermixed for efficiency -Octane xRide L3/6 intervals, 45s each x 5 minutes total for warmup during history with therapist (4 minutes unbilled) -Sit to stand from regular height chair with Airex pad under feet, no UE support 2 x 10; -Seated BUE flexion/overhead reach with small green physioball (progressing to chair T-spine extension) 2x15; cues for gaze fixation on ball with adjacent cervical extension -Standing heel raises with UE support x 20; -Standing hip 4 ways with 3# ankle weights: marching, hip abduction, hip  extension, HS curls 1x15 each; *extension appears more labored and limited. Most performed with single-double UE support on chairback -static stance self ball toss 2000g ball 2x15 (to promote large amplitude high velocity movement as well as perturbation) -forward step over foam balance beam (to encourage large motor patterns) 1x15 bilat; struggles with Fwd Rt, Back left, needs extra verbal cues and has LOB 50% of time author required for righting -lateral step-overs 1x15 bilat (half foam roller) to promote large amplitude movements; Min guard assist, no LOB -ball floor slam and catch (rainbow ball) 1x25 min guard assist   -wall rebound (chest pass style) 21f away (and catch 1x20) minguard assist. No LOB.      PT Short Term Goals - 09/30/19 1506      PT SHORT TERM GOAL #1   Title  Patient will be independent in home exercise program to improve strength/mobility for better functional independence with ADLs.    Baseline  09/25/19: pt performing 3-4x/wk    Time  4    Period  Weeks    Status  Partially Met    Target Date  10/28/19        PT Long Term Goals - 09/30/19 1506      PT LONG TERM GOAL #1   Title  Patient will increase BLE gross strength to 4+/5 as to improve functional strength  for independent gait, increased standing tolerance and increased ADL ability.    Baseline  09/25/19: R/L Hip extension: 4-/4-, Hip abduction: 4-/4-, Hip adduction 4-/4-, Ankle PF: <3/<3, (otherwise at least 4+/5 bilateral)    Time  8    Period  Weeks    Status  On-going    Target Date  11/25/19      PT LONG TERM GOAL #2   Title  Patient will be able to perform household work/ chores without increase in symptoms.    Baseline  09/25/19: Pt reports no further neck pain, able to perform all activities without pain.    Time  8    Period  Weeks    Status  Achieved      PT LONG TERM GOAL #3   Title  Patient will report a worst pain of 2/10 on VAS in   neck    to improve tolerance with ADLs and reduced  symptoms with activities.    Baseline  09/25/19: 0/10, pt reports no further neck pain    Time  8    Period  Weeks    Status  Achieved      PT LONG TERM GOAL #4   Title  Pt will improve single leg balance to >20s bilaterally in order to decrease risk for falls and improve function at home.    Baseline  09/30/19: LLE: 9.8s, RLE: 16.3s    Time  8    Period  Weeks    Status  New    Target Date  11/25/19      PT LONG TERM GOAL #5   Title  Pt will improve Mini-BESTest by at least 4 points in order to demonstrate clinically significant improvement in balance and decrease risk for future falls.    Baseline  09/30/19: 21/28    Time  8    Period  Weeks    Status  New    Target Date  11/25/19            Plan - 10/28/19 1306    Clinical Impression Statement  Pt able to complete entire session as planned with rest breaks provided as needed, albeit few. Pt maintains high level of focus and motivation. Pt makes several sudden rapid movements outside of BOS to catch ball or retrieve from floor, all without LOB, good postural control noted. Pt does have several LOB with A/P stepping strategy with unilateral bias. Extensive verbal, visual, and tactile cues are provided for most accurate form possible. Author provides minA intermittently for full ROM when needed. Overall pt continues to make steady progress toward treatment goals. Transitioned most LSVT style activities to more ball centric standing activity for improved fluency and balance component.    Rehab Potential  Fair    PT Frequency  2x / week    PT Duration  8 weeks    PT Treatment/Interventions  Cryotherapy;Electrical Stimulation;Moist Heat;Traction;Ultrasound;Therapeutic activities;Therapeutic exercise;Patient/family education;Manual techniques;Passive range of motion    PT Next Visit Plan  Balance and LE strengthening    PT Home Exercise Plan  Medbridge Access Code: [email or web address]  (10/02/19: tandem gait, tandem balance, single leg balance)     Consulted and Agree with Plan of Care  Patient       Patient will benefit from skilled therapeutic intervention in order to improve the following deficits and impairments:  Pain, Decreased activity tolerance, Decreased endurance, Decreased range of motion, Decreased strength  Visit Diagnosis: Muscle weakness (generalized)  Unsteadiness on feet  Neck pain,  chronic     Problem List There are no problems to display for this patient. 1:46 PM, 10/28/19 Etta Grandchild, PT, DPT Physical Therapist - [facility]  Outpatient Physical [place] [phone]     Etta Grandchild 10/28/2019, 1:08 PM  [place] MAIN [facility] SERVICES [address] [place], [place], [address] Phone: [phone]   Fax:  [phone]  Name: Joshua George MRN: [id number] Date of Birth: 24-Jun-1941

## 2019-10-29 ENCOUNTER — Encounter: Payer: Self-pay | Admitting: Speech Pathology

## 2019-10-29 NOTE — Therapy (Signed)
[place] MAIN [facility] SERVICES [address] [place], [place], [address] Phone: [phone]   Fax:  [phone]  Speech Language Pathology Treatment/Progress Note  Speech Therapy Progress Note   Dates of reporting period  07/24/2019   to   10/28/2019   Patient Details  Name: Joshua George MRN: [id number] Date of Birth: Apr 28, 1941 Referring Provider (SLP): Dr. Manuella Ghazi   Encounter Date: 10/28/2019  End of Session - 10/29/19 1131    Visit Number  10    Number of Visits  17    Date for SLP Re-Evaluation  11/15/19    Authorization Type  Medicare    Authorization Time Period  Start 07/24/2019    Authorization - Visit Number  10    Authorization - Number of Visits  10    SLP Start Time  1400    SLP Stop Time   1450    SLP Time Calculation (min)  50 min    Activity Tolerance  Patient tolerated treatment well       History reviewed. No pertinent past medical history.  History reviewed. No pertinent surgical history.  There were no vitals filed for this visit.  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  The patient was provided with written and verbal teaching regarding neck, shoulder, tongue, and throat stretches exercises to promote relaxed phonation.   The patient was provided with written and verbal teaching regarding breath support  exercises.  The patient was instructed in the need for good posture to improve airflow and breath support.  Flow Phonation Skill Level One: establish airflow release: Unarticulated: Patient able to generate multiple samples of unarticulated airflow without stoppage due to tension.  Articulated: Patient able to maintain articulated airflow for up to 10 syllables.  Skill Level Two: Airflow + Voicing: Unarticulated:  Patient consistently able to produce relaxed phonation with oral resonance in unarticulated voiced airflow.  Articulated: Patient able to read aloud sentences and answer simple questions using strong voice with oral resonance.  Initiated voice Building exercises.        Assessment / Recommendations / Plan   Plan  Continue with current plan of care      Progression Toward Goals   Progression toward goals  Progressing toward goals       SLP Education - 10/29/19 1131    Education Details  Voice building exercises    Person(s) Educated  Patient    Methods  Explanation;Handout    Comprehension  Verbalized understanding         SLP Long Term Goals - 10/29/19 1134      SLP LONG TERM GOAL #1   Title  The patient will demonstrate independent understanding of vocal hygiene concepts and extrinsic laryngeal muscle stretches.    Status  Achieved  SLP LONG TERM GOAL #2   Title  The patient will be independent for abdominal breathing and breath support exercises.    Status  Achieved      SLP LONG TERM GOAL #3   Title  The patient will maximize voice quality and loudness using breath support/oral resonance for sustained vowel production, pitch glides, and hierarchal speech drill.    Status  Partially Met    Target Date  11/15/19      SLP LONG TERM GOAL #4   Title  The patient will maximize voice quality and loudness using breath support/oral resonance for paragraph length recitation with 80% accuracy.    Status  Partially Met    Target Date  11/15/19       Plan - 10/29/19 1133     Clinical Impression Statement  The patient demonstrates understanding of flow phonation with good mastery of unarticulated/unvoiced airflow, articulated/unvoiced speech, unarticulated/voiced airflow, and articulated/voiced speech.   He has been given instruction in generating louder voice with out strain for voice strengthening. Noted fatigue as session progressed. Patient demonstrates understanding of a louder voice in comparison to a softer voice and is able to identify the differences in his own speech.    Speech Therapy Frequency  2x / week    Duration  Other (comment)    Treatment/Interventions  Other (comment);Patient/family education    Potential to Achieve Goals  Good    Potential Considerations  Ability to learn/carryover information;Previous level of function;Co-morbidities;Severity of impairments;Cooperation/participation level;Medical prognosis;Family/community support    SLP Home Exercise Plan  Provided    Consulted and Agree with Plan of Care  Patient       Patient will benefit from skilled therapeutic intervention in order to improve the following deficits and impairments:   Dysphonia    Problem List There are no problems to display for this patient.  [name], MS/CCC- SLP  [name] 10/29/2019, 11:35 AM  [place] MAIN [facility] SERVICES [address] [place], [place], [address] Phone: [phone]   Fax:  [phone]   Name: Joshua George MRN: [id number] Date of Birth: July 12, 1941

## 2019-10-31 ENCOUNTER — Ambulatory Visit: Payer: Medicare PPO | Admitting: Physical Therapy

## 2019-10-31 ENCOUNTER — Ambulatory Visit: Payer: Medicare PPO | Admitting: Speech Pathology

## 2019-11-04 ENCOUNTER — Ambulatory Visit: Payer: Medicare PPO | Admitting: Speech Pathology

## 2019-11-04 ENCOUNTER — Encounter: Payer: Self-pay | Admitting: Speech Pathology

## 2019-11-04 ENCOUNTER — Other Ambulatory Visit: Payer: Self-pay

## 2019-11-04 ENCOUNTER — Ambulatory Visit: Payer: Medicare PPO

## 2019-11-04 DIAGNOSIS — M6281 Muscle weakness (generalized): Secondary | ICD-10-CM | POA: Diagnosis not present

## 2019-11-04 DIAGNOSIS — R2681 Unsteadiness on feet: Secondary | ICD-10-CM

## 2019-11-04 DIAGNOSIS — R490 Dysphonia: Secondary | ICD-10-CM

## 2019-11-04 NOTE — Therapy (Signed)
[place] MAIN [facility] SERVICES [address] [place], [place], [address] Phone: [phone]   Fax:  [phone]  Speech Language Pathology Treatment  Patient Details  Name: Joshua George MRN: [id number] Date of Birth: 1941/01/05 Referring Provider (SLP): Dr. Manuella Ghazi   Encounter Date: 11/04/2019  End of Session - 11/04/19 1505    Visit Number  11    Number of Visits  17    Date for SLP Re-Evaluation  11/15/19    Authorization Type  Medicare    Authorization Time Period  Start 11/04/2019    Authorization - Visit Number  1    Authorization - Number of Visits  10    SLP Start Time  1400    SLP Stop Time   1450    SLP Time Calculation (min)  50 min    Activity Tolerance  Patient tolerated treatment well       History reviewed. No pertinent past medical history.  History reviewed. No pertinent surgical history.  There were no vitals filed for this visit.  Subjective Assessment - 11/04/19 1502    Subjective  Patient was motivated and eager to work hard.    Currently in Pain?  No/denies            ADULT SLP TREATMENT - 11/04/19 0001      General Information   Behavior/Cognition  Alert;Cooperative;Pleasant mood    HPI  79 year old man diagnosed with Parkinson's disease with subsequent dysphonia and hypophonia       Treatment Provided   Treatment provided  Cognitive-Linquistic      Pain Assessment   Pain Assessment  No/denies pain      Cognitive-Linquistic Treatment   Treatment focused on  Voice    Skilled Treatment  The patient was prompted to participate in a spontaneous speech task. He was prompted to increase his volume with visual and gestural cues from the clinician throughout the task. Patient was able to generate flow phonation at an increased volume with minimal clinician cues throughout the session. No fatigue noted as session progressed. Patient was educated on strategies to use in conversation with his wife to improve  communication.     Assessment / Recommendations / Plan   Plan  Continue with current plan of care      Progression Toward Goals   Progression toward goals  Progressing toward goals       SLP Education - 11/04/19 1503    Education Details  visual and verbal cueing for voice building    Person(s) Educated  Patient    Methods  Explanation    Comprehension  Verbalized understanding         SLP Long Term Goals - 10/29/19 1134      SLP LONG TERM GOAL #1   Title  The patient will demonstrate independent understanding of vocal hygiene concepts and extrinsic laryngeal muscle stretches.    Status  Achieved      SLP LONG TERM GOAL #2   Title  The patient will be independent for abdominal breathing and breath support exercises.    Status  Achieved      SLP LONG TERM GOAL #3   Title  The patient will maximize voice quality and loudness using breath support/oral resonance for sustained vowel production, pitch glides, and hierarchal speech drill.    Status  Partially Met    Target Date  11/15/19      SLP LONG TERM GOAL #4   Title  The  patient will maximize voice quality and loudness using breath support/oral resonance for paragraph length recitation with 80% accuracy.    Status  Partially Met    Target Date  11/15/19       Plan - 11/04/19 1506    Clinical Impression Statement  The patient demonstrates understanding of flow phonation with good mastery of unarticulated/unvoiced airflow, articulated/unvoiced speech, unarticulated/voiced airflow, and articulated/voiced speech.   He has been given instruction in generating louder voice with out strain for voice strengthening. Patient was able to distinguish loud vs. soft voice, and noted "I sound stronger". Patient shared that his wife is able to hear him better than in the past.    Speech Therapy Frequency  2x / week    Duration  Other (comment)    Treatment/Interventions  Other (comment);Patient/family education    Potential to Achieve  Goals  Good    Potential Considerations  Ability to learn/carryover information;Previous level of function;Co-morbidities;Severity of impairments;Cooperation/participation level;Medical prognosis;Family/community support    SLP Home Exercise Plan  Provided    Consulted and Agree with Plan of Care  Patient       Patient will benefit from skilled therapeutic intervention in order to improve the following deficits and impairments:   Dysphonia    Problem List There are no problems to display for this patient.   [name] 11/05/2019, 8:28 AM  [place] MAIN [facility] SERVICES [address] [place], [place], [address] Phone: [phone]   Fax:  [phone]   Name: Octavis Sheeler MRN: [id number] Date of Birth: 09-28-1940

## 2019-11-04 NOTE — Therapy (Signed)
[place] MAIN [facility] SERVICES [address] [place], [place], [address] Phone: [phone]   Fax:  [phone]  Physical Therapy Treatment  Patient Details  Name: Joshua George MRN: [id number] Date of Birth: [birth year] Referring Provider (PT): Jennings Books   Encounter Date: 11/04/2019  PT End of Session - 11/04/19 1305    Visit Number  18    Number of Visits  33    Date for PT Re-Evaluation  11/25/19    PT Start Time  1302    PT Stop Time  1345    PT Time Calculation (min)  43 min    Equipment Utilized During Treatment  Gait belt    Activity Tolerance  Patient tolerated treatment well;No increased pain    Behavior During Therapy  [facility] for tasks assessed/performed       History reviewed. No pertinent past medical history.  History reviewed. No pertinent surgical history.  There were no vitals filed for this visit.  Subjective Assessment - 11/04/19 1304    Subjective  Pt doing well today. Denies any pain. No falls/stumbles since last therapy visit. No specific questions or concerns upon arrival.    Pertinent History  Patient has low back pain for over a year that is constant. He does not ambulate with a device. He has had neck pain that is intermittent for over a year and ranges from 0/10 to 4/10.    Currently in Pain?  No/denies        TREATMENT   Ther-ex NuStep L2/3 x 4 minutes total for warmup during history with therapist (4 minutes unbilled); Sit to stand from regular height chair with Airex pad under feet, no UEsupport2 x 10; Precor BLE leg press 85# x 20, 90# x 20 2kg med ball floor slam and catch x 20 with CGA, pt having to occasionally bend forward to pick ball up off floor;  2kg med ball chest pass to SPT with therapist/patient moving farther away x 20; Standing heel raises with UE support x 20; Standing hip 4 ways with 4# ankle weights: marching, hip abduction, hip extension, HS curls x 15 each;   Neuromuscular  Re-education Seated dynamic reaching in front, down, overhead (added sit to stand today during overhead reach), out, and ending with a thigh slap when sitting x 10; Large forward stepping with opening of chest with big arm movements out to the side and then thigh slap on return x 10 with each leg stepping; Large lateral stepping with opening of chest with big arm movements out to the side and then thigh slap on return x 10 with each leg stepping; Seating boxing including jabs, crosses, and uppercuts utilizing both arms; Standing boxing including jabs, crosses, and uppercuts utilizing both arms, performed next to table to account for safety related to balance; Gait in hallway with cues from therapist to increase his step length and decrease overall number of steps required to traverse the same distance, performed approximately 75' x 4with therapist counting out loud for cues to patient; Backwards gait with therapist providing cues for increased step length 75' x 2;   Pt educated throughout session about proper posture and technique with exercises. Improved exercise technique, movement at target joints, use of target muscles after min to mod verbal, visual, tactile cues.   Pt demonstrates excellent motivation during session today. He is continuing to demonstrate improved sequencing with multi-step exercises. Incorporated sit to stand motion with large amplitude reaching today. Also initiated boxing with patient  and he demonstrates good ability to perform fast punching. Will continueto work on large amplitude movements in future sessions.Pt will benefit from PT services to address deficits in strength, gait, and balancein order to return to full function at home.                  PT Short Term Goals - 09/30/19 1506      PT SHORT TERM GOAL #1   Title  Patient will be independent in home exercise program to improve strength/mobility for better functional independence with  ADLs.    Baseline  09/25/19: pt performing 3-4x/wk    Time  4    Period  Weeks    Status  Partially Met    Target Date  10/28/19        PT Long Term Goals - 09/30/19 1506      PT LONG TERM GOAL #1   Title  Patient will increase BLE gross strength to 4+/5 as to improve functional strength for independent gait, increased standing tolerance and increased ADL ability.    Baseline  09/25/19: R/L Hip extension: 4-/4-, Hip abduction: 4-/4-, Hip adduction 4-/4-, Ankle PF: <3/<3, (otherwise at least 4+/5 bilateral)    Time  8    Period  Weeks    Status  On-going    Target Date  11/25/19      PT LONG TERM GOAL #2   Title  Patient will be able to perform household work/ chores without increase in symptoms.    Baseline  09/25/19: Pt reports no further neck pain, able to perform all activities without pain.    Time  8    Period  Weeks    Status  Achieved      PT LONG TERM GOAL #3   Title  Patient will report a worst pain of 2/10 on VAS in   neck    to improve tolerance with ADLs and reduced symptoms with activities.    Baseline  09/25/19: 0/10, pt reports no further neck pain    Time  8    Period  Weeks    Status  Achieved      PT LONG TERM GOAL #4   Title  Pt will improve single leg balance to >20s bilaterally in order to decrease risk for falls and improve function at home.    Baseline  09/30/19: LLE: 9.8s, RLE: 16.3s    Time  8    Period  Weeks    Status  New    Target Date  11/25/19      PT LONG TERM GOAL #5   Title  Pt will improve Mini-BESTest by at least 4 points in order to demonstrate clinically significant improvement in balance and decrease risk for future falls.    Baseline  09/30/19: 21/28    Time  8    Period  Weeks    Status  New    Target Date  11/25/19            Plan - 11/04/19 1305    Clinical Impression Statement  Pt demonstrates excellent motivation during session today. He is continuing to demonstrate improved sequencing with multi-step exercises.  Incorporated sit to stand motion with large amplitude reaching today. Also initiated boxing with patient and he demonstrates good ability to perform fast punching. Will continue to work on large amplitude movements in future sessions. Pt will benefit from PT services to address deficits in strength, gait, and balance in order to return  to full function at home.    Personal Factors and Comorbidities  Age;Comorbidity 1    Comorbidities  Parkinsons    Examination-Activity Limitations  Caring for Others;Stand;Locomotion Level    Examination-Participation Restrictions  Yard Work;Driving    Stability/Clinical Decision Making  Stable/Uncomplicated    Clinical Decision Making  Low    Rehab Potential  Fair    PT Frequency  2x / week    PT Duration  8 weeks    PT Treatment/Interventions  Cryotherapy;Electrical Stimulation;Moist Heat;Traction;Ultrasound;Therapeutic activities;Therapeutic exercise;Patient/family education;Manual techniques;Passive range of motion    PT Next Visit Plan  Balance and LE strengthening    PT Home Exercise Plan  Medbridge Access Code: [email or web address]  (10/02/19: tandem gait, tandem balance, single leg balance)    Consulted and Agree with Plan of Care  Patient       Patient will benefit from skilled therapeutic intervention in order to improve the following deficits and impairments:  Pain, Decreased activity tolerance, Decreased endurance, Decreased range of motion, Decreased strength  Visit Diagnosis: Muscle weakness (generalized)  Unsteadiness on feet     Problem List There are no problems to display for this patient.  Phillips Grout PT, DPT, GCS  Dawid Dupriest 11/05/2019, 8:14 AM  [place] MAIN [facility] SERVICES [address] [place], [place], [address] Phone: [phone]   Fax:  [phone]  Name: Joshua George MRN: [id number] Date of Birth: 12-Sep-1941

## 2019-11-07 ENCOUNTER — Other Ambulatory Visit: Payer: Self-pay

## 2019-11-07 ENCOUNTER — Ambulatory Visit: Payer: Medicare PPO | Admitting: Speech Pathology

## 2019-11-07 ENCOUNTER — Ambulatory Visit: Payer: Medicare PPO

## 2019-11-07 DIAGNOSIS — R490 Dysphonia: Secondary | ICD-10-CM

## 2019-11-07 DIAGNOSIS — M6281 Muscle weakness (generalized): Secondary | ICD-10-CM

## 2019-11-07 DIAGNOSIS — R2681 Unsteadiness on feet: Secondary | ICD-10-CM

## 2019-11-07 NOTE — Therapy (Signed)
[place] MAIN [facility] SERVICES [address] [place], [place], [address] Phone: [phone]   Fax:  [phone]  Physical Therapy Treatment  Patient Details  Name: Joshua George MRN: [id number] Date of Birth: [birth year] Referring Provider (PT): Jennings Books   Encounter Date: 11/07/2019  PT End of Session - 11/07/19 1525    Visit Number  19    Number of Visits  33    Date for PT Re-Evaluation  11/25/19    PT Start Time  1062    PT Stop Time  1600    PT Time Calculation (min)  45 min    Equipment Utilized During Treatment  Gait belt    Activity Tolerance  Patient tolerated treatment well;No increased pain    Behavior During Therapy  [facility] for tasks assessed/performed       History reviewed. No pertinent past medical history.  History reviewed. No pertinent surgical history.  There were no vitals filed for this visit.  Subjective Assessment - 11/08/19 0849    Subjective  Pt doing well today. Denies any pain. No falls/stumbles since last therapy visit. No specific questions or concerns upon arrival.    Pertinent History  Patient has low back pain for over a year that is constant. He does not ambulate with a device. He has had neck pain that is intermittent for over a year and ranges from 0/10 to 4/10.    Currently in Pain?  No/denies         TREATMENT   Ther-ex NuStep L2/4 x 5 minutestotalfor warmup during history with therapist (4 minutes unbilled); Precor BLE leg press 90# x 20, 100# x 20; 6" forward step ups alternating leading LE x 10 each; 6" lateral step ups x 15; 2kg med ball floor slam and catch x 20 with CGA, pt having to occasionally bend forward to pick ball up off floor; 2kg med ball chest pass for distance as far as possible x 10; Sit to stand from regular height chair with Airex pad under feet, no UEsupport2 x 10;   Neuromuscular Re-education Seated dynamic reaching in front, down, overhead (added sit to  stand today during overhead reach), out, and ending with a thigh slap when sitting x 10; Large forward stepping with opening of chest with big arm movements out to the side and then thigh slap on return x 10 with each leg stepping; Large lateral stepping with opening of chest with big arm movements out to the side and then thigh slap on return x 10 with each leg stepping; Standing boxing including jabs, crosses, and uppercuts utilizing both arms, performed next to table to account for safety related to balance; Sidestepping cross-over steps x 30' each direction; Backwards gait with therapist providing cues for increased step length x 75';   Pt educated throughout session about proper posture and technique with exercises. Improved exercise technique, movement at target joints, use of target muscles after min to mod verbal, visual, tactile cues.   Pt demonstrates excellent motivation during session today.He is continuing to demonstrate improved sequencing with multi-step exercises however requiring intermittent cues. Continued boxing with patient and he demonstrates good ability to perform fast punching. Will continueto work on large amplitude and explosive movements in future sessions.Pt will benefit from PT services to address deficits in strength, gait, and balancein order to return to full function at home.  PT Short Term Goals - 09/30/19 1506      PT SHORT TERM GOAL #1   Title  Patient will be independent in home exercise program to improve strength/mobility for better functional independence with ADLs.    Baseline  09/25/19: pt performing 3-4x/wk    Time  4    Period  Weeks    Status  Partially Met    Target Date  10/28/19        PT Long Term Goals - 09/30/19 1506      PT LONG TERM GOAL #1   Title  Patient will increase BLE gross strength to 4+/5 as to improve functional strength for independent gait, increased standing  tolerance and increased ADL ability.    Baseline  09/25/19: R/L Hip extension: 4-/4-, Hip abduction: 4-/4-, Hip adduction 4-/4-, Ankle PF: <3/<3, (otherwise at least 4+/5 bilateral)    Time  8    Period  Weeks    Status  On-going    Target Date  11/25/19      PT LONG TERM GOAL #2   Title  Patient will be able to perform household work/ chores without increase in symptoms.    Baseline  09/25/19: Pt reports no further neck pain, able to perform all activities without pain.    Time  8    Period  Weeks    Status  Achieved      PT LONG TERM GOAL #3   Title  Patient will report a worst pain of 2/10 on VAS in   neck    to improve tolerance with ADLs and reduced symptoms with activities.    Baseline  09/25/19: 0/10, pt reports no further neck pain    Time  8    Period  Weeks    Status  Achieved      PT LONG TERM GOAL #4   Title  Pt will improve single leg balance to >20s bilaterally in order to decrease risk for falls and improve function at home.    Baseline  09/30/19: LLE: 9.8s, RLE: 16.3s    Time  8    Period  Weeks    Status  New    Target Date  11/25/19      PT LONG TERM GOAL #5   Title  Pt will improve Mini-BESTest by at least 4 points in order to demonstrate clinically significant improvement in balance and decrease risk for future falls.    Baseline  09/30/19: 21/28    Time  8    Period  Weeks    Status  New    Target Date  11/25/19            Plan - 11/08/19 0850    Clinical Impression Statement  Pt demonstrates excellent motivation during session today. He is continuing to demonstrate improved sequencing with multi-step exercises however requiring intermittent cues. Continued boxing with patient and he demonstrates good ability to perform fast punching. Will continue to work on large amplitude and explosive movements in future sessions. Pt will benefit from PT services to address deficits in strength, gait, and balance in order to return to full function at home.     Personal Factors and Comorbidities  Age;Comorbidity 1    Comorbidities  Parkinsons    Examination-Activity Limitations  Caring for Others;Stand;Locomotion Level    Examination-Participation Restrictions  Yard Work;Driving    Stability/Clinical Decision Making  Stable/Uncomplicated    Rehab Potential  Fair    PT Frequency  2x /  week    PT Duration  8 weeks    PT Treatment/Interventions  Cryotherapy;Electrical Stimulation;Moist Heat;Traction;Ultrasound;Therapeutic activities;Therapeutic exercise;Patient/family education;Manual techniques;Passive range of motion    PT Next Visit Plan  Update outcome measures, goals, progress note; Balance and LE strengthening    PT Home Exercise Plan  Medbridge Access Code: [email or web address]  (10/02/19: tandem gait, tandem balance, single leg balance)    Consulted and Agree with Plan of Care  Patient       Patient will benefit from skilled therapeutic intervention in order to improve the following deficits and impairments:  Pain, Decreased activity tolerance, Decreased endurance, Decreased range of motion, Decreased strength  Visit Diagnosis: Muscle weakness (generalized)  Unsteadiness on feet     Problem List There are no problems to display for this patient.  Phillips Grout PT, DPT, GCS  Airelle Everding 11/08/2019, 12:36 PM  [place] MAIN [facility] SERVICES [address] [place], [place], [address] Phone: [phone]   Fax:  [phone]  Name: Ziair Penson MRN: [id number] Date of Birth: Mar 22, 1941

## 2019-11-08 ENCOUNTER — Encounter: Payer: Self-pay | Admitting: Speech Pathology

## 2019-11-08 NOTE — Therapy (Signed)
[place] MAIN [facility] SERVICES [address] [place], [place], [address] Phone: [phone]   Fax:  [phone]  Speech Language Pathology Treatment  Patient Details  Name: Joshua George MRN: [id number] Date of Birth: 19-Feb-1941 Referring Provider (SLP): Dr. Manuella Ghazi   Encounter Date: 11/07/2019  End of Session - 11/08/19 0927    Visit Number  12    Number of Visits  17    Date for SLP Re-Evaluation  11/15/19    Authorization Type  Medicare    Authorization Time Period  Start 11/04/2019    Authorization - Visit Number  2    Authorization - Number of Visits  10    SLP Start Time  1400    SLP Stop Time   1450    SLP Time Calculation (min)  50 min    Activity Tolerance  Patient tolerated treatment well       History reviewed. No pertinent past medical history.  History reviewed. No pertinent surgical history.  There were no vitals filed for this visit.  Subjective Assessment - 11/08/19 0925    Subjective  Patient agrees that maintaining a loud voice is hard work.    Currently in Pain?  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 Patient required prompts to repeat 5/22 sentences louder. Patient was then asked to engage in automatic speech tasks, counting to ten, repeating the days of the week, and reciting the alphabet. Patient was 100% accurate on production of 1-10 in a loud, clear voice. Patient was 90% accurate on reciting days of  the week, noted fatigue and deterioration of loudness as task continued. Patient was unable to complete recitation of the alphabet due to interruption of airflow, and required 6 prompts to retry it. He demonstrated disorganized breathing and often spoke on an inhale. Patient was educated on importance of planned breathing and using diaphragmatic breathing to support loud speech. Patient was then asked to complete idioms using a loud voice, with clinician model. Patient was 100% accurate on the task, but when asked to repeat the whole sentence was inconsistent in loudness. Patient continues to demonstrate deterioration in loudness during spontaneous speech tasks and requires visual, gestural cues from the clinician. Patient benefitted from flow phonation techniques to stimulate a louder voice.       Assessment / Recommendations / Plan   Plan  Continue with current plan of care       SLP Education - 11/08/19 0926    Education Details  visual, verbal cueing for voice building, automatic speech tasks    Person(s) Educated  Patient    Methods  Explanation    Comprehension  Verbalized understanding         SLP Long Term Goals - 10/29/19 1134      SLP LONG TERM GOAL #1   Title  The patient will demonstrate independent understanding of vocal  hygiene concepts and extrinsic laryngeal muscle stretches.    Status  Achieved      SLP LONG TERM GOAL #2   Title  The patient will be independent for abdominal breathing and breath support exercises.    Status  Achieved      SLP LONG TERM GOAL #3   Title  The patient will maximize voice quality and loudness using breath support/oral resonance for sustained vowel production, pitch glides, and hierarchal speech drill.    Status  Partially Met    Target Date  11/15/19      SLP LONG TERM GOAL #4   Title  The patient will maximize voice quality and loudness using breath support/oral resonance for paragraph length recitation with 80% accuracy.    Status   Partially Met    Target Date  11/15/19       Plan - 11/08/19 5462    Clinical Impression Statement  Patient reports he feels that he sounds "stronger". Patient reports he is taking a new medicine that is causing xerostomia, which he perceives as making his voice sound hoarse. Deterioration in loudness noted across the session, especially near the end in spontaneous speech. Patient does not generate loud speech independently and requires prompting from the clinician, but is able to perceive when his voice is "strong" and when it is not. Patient was also noted to hold his breath during multiple speech tasks.    Speech Therapy Frequency  2x / week    Duration  Other (comment)    Treatment/Interventions  Other (comment);Patient/family education    Potential to Achieve Goals  Good    Potential Considerations  Ability to learn/carryover information;Previous level of function;Co-morbidities;Severity of impairments;Cooperation/participation level;Medical prognosis;Family/community support    SLP Home Exercise Plan  Provided    Consulted and Agree with Plan of Care  Patient       Patient will benefit from skilled therapeutic intervention in order to improve the following deficits and impairments:   Dysphonia    Problem List There are no problems to display for this patient.   Christyna Letendre 11/08/2019, 9:28 AM  [place] MAIN [facility] SERVICES [address] [place], [place], [address] Phone: [phone]   Fax:  [phone]   Name: Joshua George MRN: [id number] Date of Birth: 1941-05-25

## 2019-11-12 ENCOUNTER — Ambulatory Visit: Payer: Medicare PPO

## 2019-11-14 ENCOUNTER — Other Ambulatory Visit: Payer: Self-pay

## 2019-11-14 ENCOUNTER — Ambulatory Visit: Payer: Medicare PPO | Attending: Neurology | Admitting: Speech Pathology

## 2019-11-14 ENCOUNTER — Encounter: Payer: Self-pay | Admitting: Speech Pathology

## 2019-11-14 DIAGNOSIS — R2681 Unsteadiness on feet: Secondary | ICD-10-CM | POA: Insufficient documentation

## 2019-11-14 DIAGNOSIS — M6281 Muscle weakness (generalized): Secondary | ICD-10-CM | POA: Insufficient documentation

## 2019-11-14 DIAGNOSIS — R490 Dysphonia: Secondary | ICD-10-CM | POA: Diagnosis present

## 2019-11-14 NOTE — Therapy (Signed)
[place] MAIN [facility] SERVICES [address] [place], [place], [address] Phone: [phone]   Fax:  [phone]  Speech Language Pathology Treatment  Patient Details  Name: Joshua George MRN: [id number] Date of Birth: Feb 21, 1941 Referring Provider (SLP): Dr. Manuella Ghazi   Encounter Date: 11/14/2019  End of Session - 11/14/19 1629    Visit Number  13    Number of Visits  17    Date for SLP Re-Evaluation  11/15/19    Authorization Type  Medicare    Authorization Time Period  Start 11/04/2019    Authorization - Visit Number  3    Authorization - Number of Visits  10    SLP Start Time  1300    SLP Stop Time   1350    SLP Time Calculation (min)  50 min    Activity Tolerance  Patient tolerated treatment well       History reviewed. No pertinent past medical history.  History reviewed. No pertinent surgical history.  There were no vitals filed for this visit.         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 He required prompts to slow his breathing and extend the length of his exhalation. The patient was asked to prolong a vowel x10. He was 80% accurate in producing a loud clear voice, and demonstrated increased awareness of his volume. He was educated on the vowel glide task, and asked to produce /i/ with varying pitch x10 (low to high, then medium to low). The patient demonstrated excellent pitch variation, but decreased loudness across the task. The patient was then asked to repeat 10 expressive sentences after the clinician and focus on 3 different aspects  of communication: facial gestures, breath support, and volume. The patient demonstrated excellent facial gestures on 100% tasks, particularly raising eyebrows and smiling. He was 90% accurate on coordinated breath support, and with clinician cues was 100% accurate. The patient was 80% accurate on using a loud voice. His two errors were at the beginning of phrases. The patient accurately identified the quieter phrases and was able to correct them to 90% accuracy. The patient was asked to generate 5 personally functional phrases to practice saying loudly, with emphasis on breath support and loudness. He generated sentences with decreased loudness in comparison to the previous task, but was able to increase his volume to 100% accuracy given clinician cues.  The patient demonstrated breath-holding on all of these sentences, but was able to improve airflow given clinician cues. Lastly, the patient was asked to repeat sentences of increased length (f=30). He independently generated a loud voice on 63% of phrases. 7/11 of the sentences that required additional cues were at the beginning of the longest phrase.       Assessment / Recommendations / Plan   Plan  Continue with current plan of care      Progression Toward Goals   Progression toward goals  Progressing toward goals       SLP Education - 11/14/19 1628    Education Details  improved loudness with breath support  Person(s) Educated  Patient    Methods  Explanation    Comprehension  Verbalized understanding         SLP Long Term Goals - 10/29/19 1134      SLP LONG TERM GOAL #1   Title  The patient will demonstrate independent understanding of vocal hygiene concepts and extrinsic laryngeal muscle stretches.    Status  Achieved      SLP LONG TERM GOAL #2   Title  The patient will be independent for abdominal breathing and breath support exercises.    Status  Achieved      SLP LONG TERM GOAL #3   Title  The patient will maximize voice  quality and loudness using breath support/oral resonance for sustained vowel production, pitch glides, and hierarchal speech drill.    Status  Partially Met    Target Date  11/15/19      SLP LONG TERM GOAL #4   Title  The patient will maximize voice quality and loudness using breath support/oral resonance for paragraph length recitation with 80% accuracy.    Status  Partially Met    Target Date  11/15/19       Plan - 11/14/19 1629    Clinical Impression Statement  The patient was able to recognize a soft voice versus a loud voice today with increased accuracy. He continues to complain of xerostomia secondary to his medication, but brought a bottle of water today, which appeared to be helpful. The patient is able to generate a loud voice, especially near the end of sentences, but continues to struggle with initiation. He shared that he has a hard time starting sentences in his daily life and often must repeat the first words in sentences. The patient continues to demonstrate breath-holding during speech tasks but can be redirected with visual and verbal cues.    Speech Therapy Frequency  2x / week    Duration  Other (comment)    Treatment/Interventions  Other (comment);Patient/family education    Potential to Achieve Goals  Good    Potential Considerations  Ability to learn/carryover information;Previous level of function;Co-morbidities;Severity of impairments;Cooperation/participation level;Medical prognosis;Family/community support    SLP Home Exercise Plan  Provided    Consulted and Agree with Plan of Care  Patient       Patient will benefit from skilled therapeutic intervention in order to improve the following deficits and impairments:   Dysphonia    Problem List There are no problems to display for this patient.   Tinsley Lomas 11/14/2019, 4:30 PM  [place] [facility] MAIN [facility] SERVICES [address] [place], [place], [address] Phone: [phone]    Fax:  [phone]   Name: Joshua George MRN: [id number] Date of Birth: Jun 24, 1941

## 2019-11-18 ENCOUNTER — Ambulatory Visit: Payer: Medicare PPO | Admitting: Speech Pathology

## 2019-11-18 ENCOUNTER — Ambulatory Visit: Payer: Medicare PPO

## 2019-11-18 ENCOUNTER — Encounter: Payer: Self-pay | Admitting: Speech Pathology

## 2019-11-18 ENCOUNTER — Other Ambulatory Visit: Payer: Self-pay

## 2019-11-18 DIAGNOSIS — R490 Dysphonia: Secondary | ICD-10-CM

## 2019-11-18 DIAGNOSIS — R2681 Unsteadiness on feet: Secondary | ICD-10-CM

## 2019-11-18 DIAGNOSIS — M6281 Muscle weakness (generalized): Secondary | ICD-10-CM

## 2019-11-18 NOTE — Therapy (Signed)
[place] MAIN [facility] SERVICES [address] [place], [place], [address] Phone: [phone]   Fax:  [phone]  Physical Therapy Progress Note   Dates of reporting period  09/25/19   to   11/18/19  Patient Details  Name: Joshua George MRN: [id number] Date of Birth: 07-19-1941 Referring Provider (PT): Jennings Books   Encounter Date: 11/18/2019  PT End of Session - 11/18/19 1308    Visit Number  20    Number of Visits  33    Date for PT Re-Evaluation  11/25/19    PT Start Time  0762    PT Stop Time  1345    PT Time Calculation (min)  41 min    Equipment Utilized During Treatment  Gait belt    Activity Tolerance  Patient tolerated treatment well;No increased pain    Behavior During Therapy  [facility] for tasks assessed/performed       History reviewed. No pertinent past medical history.  History reviewed. No pertinent surgical history.  There were no vitals filed for this visit.  Subjective Assessment - 11/18/19 1304    Subjective  Pt doing well today. Denies any pain. No falls/stumbles since last therapy visit. No specific questions or concerns upon arrival.    Pertinent History  Patient has low back pain for over a year that is constant. He does not ambulate with a device. He has had neck pain that is intermittent for over a year and ranges from 0/10 to 4/10.    Currently in Pain?  No/denies          TREATMENT   Ther-ex Updated outcome measures and goals with patient: Single leg balance:  LLE: 17.9s, RLE: 9.6s (best of 3 trials for each) MiniBest: 20/28; MMT: R/L Hip extension: 4/4, Hip abduction: 4/4, Hip adduction 4+/4+, Ankle PF: <3/<3, (otherwise at least 4+/5 bilateral)  Precor BLE leg press 100# x 30, 115# x 20;; Sit to stand from regular height chair with 3kg overhead med ball lift in standing2 x 10;   Pt educated throughout session about proper posture and technique with exercises. Improved exercise technique, movement  at target joints, use of target muscles after min to mod verbal, visual, tactile cues.   Pt has no further neck pain at this time with all activity. He is independent with his home exercise program for his neck and his posture as well as balance however he only performs 1-2x/wk. Outcome measures and goals updated with patient today. Overall is balance is quite good. His MiniBest was 20/28 today compared to 21/28 on the Mini-BESTest when last tested. His single leg balance time is unchanged since it was last tested however he has a notable improvement in his BLE strength compared to last testing. Pt will have two more visits to continue strengthening and reinforce HEP at which time he will be discharged. Pt will benefit from skilled PT services to address deficits in balance and decrease risk for future falls.                          PT Short Term Goals - 11/18/19 1310      PT SHORT TERM GOAL #1   Title  Patient will be independent in home exercise program to improve strength/mobility for better functional independence with ADLs.    Baseline  09/25/19: pt performing 3-4x/wk; 11/18/19: performing twice/week    Time  4    Period  Weeks  Status  Partially Met    Target Date  10/28/19        PT Long Term Goals - 11/18/19 1310      PT LONG TERM GOAL #1   Title  Patient will increase BLE gross strength to 4+/5 as to improve functional strength for independent gait, increased standing tolerance and increased ADL ability.    Baseline  09/25/19: R/L Hip extension: 4-/4-, Hip abduction: 4-/4-, Hip adduction 4-/4-, Ankle PF: <3/<3, (otherwise at least 4+/5 bilateral); 11/18/19:  R/L Hip extension: 4/4, Hip abduction: 4/4, Hip adduction 4+/4+, Ankle PF: <3/<3, (otherwise at least 4+/5 bilateral)    Time  8    Period  Weeks    Status  Partially Met    Target Date  11/25/19      PT LONG TERM GOAL #2   Title  Patient will be able to perform household work/ chores without increase  in symptoms.    Baseline  09/25/19: Pt reports no further neck pain, able to perform all activities without pain.    Time  8    Period  Weeks    Status  Achieved      PT LONG TERM GOAL #3   Title  Patient will report a worst pain of 2/10 on VAS in   neck    to improve tolerance with ADLs and reduced symptoms with activities.    Baseline  09/25/19: 0/10, pt reports no further neck pain    Time  8    Period  Weeks    Status  Achieved      PT LONG TERM GOAL #4   Title  Pt will improve single leg balance to >20s bilaterally in order to decrease risk for falls and improve function at home.    Baseline  09/30/19: LLE: 9.8s, RLE: 16.3s; 11/18/19: LLE: 17.9s, RLE: 9.6s (best of 3 trials for each);    Time  8    Period  Weeks    Status  On-going    Target Date  11/25/19      PT LONG TERM GOAL #5   Title  Pt will improve Mini-BESTest by at least 4 points in order to demonstrate clinically significant improvement in balance and decrease risk for future falls.    Baseline  09/30/19: 21/28; 11/18/19: 20/28    Time  8    Period  Weeks    Status  On-going    Target Date  11/25/19            Plan - 11/18/19 1309    Clinical Impression Statement  Pt has no further neck pain at this time with all activity. He is independent with his home exercise program for his neck and his posture as well as balance however he only performs 1-2x/wk. Outcome measures and goals updated with patient today. Overall is balance is quite good. His MiniBest was 20/28 today compared to 21/28 on the Mini-BESTest when last tested. His single leg balance time is unchanged since it was last tested however he has a notable improvement in his BLE strength compared to last testing. Pt will have two more visits to continue strengthening and reinforce HEP at which time he will be discharged. Pt will benefit from skilled PT services to address deficits in balance and decrease risk for future falls.    Personal Factors and Comorbidities   Age;Comorbidity 1    Comorbidities  Parkinsons    Examination-Activity Limitations  Caring for Others;Stand;Locomotion Level  Examination-Participation Restrictions  Yard Work;Driving    Stability/Clinical Decision Making  Stable/Uncomplicated    Rehab Potential  Fair    PT Frequency  2x / week    PT Duration  8 weeks    PT Treatment/Interventions  Cryotherapy;Electrical Stimulation;Moist Heat;Traction;Ultrasound;Therapeutic activities;Therapeutic exercise;Patient/family education;Manual techniques;Passive range of motion    PT Next Visit Plan  Balance and LE strengthening    PT Home Exercise Plan  Medbridge Access Code: [email or web address]  (10/02/19: tandem gait, tandem balance, single leg balance)    Consulted and Agree with Plan of Care  Patient       Patient will benefit from skilled therapeutic intervention in order to improve the following deficits and impairments:  Pain, Decreased activity tolerance, Decreased endurance, Decreased range of motion, Decreased strength  Visit Diagnosis: Muscle weakness (generalized)  Unsteadiness on feet     Problem List There are no problems to display for this patient.  Phillips Grout PT, DPT, GCS  , 11/18/2019, 2:39 PM  [place] MAIN [facility] SERVICES [address] [place], [place], [address] Phone: [phone]   Fax:  [phone]  Name: Mikie Misner MRN: [id number] Date of Birth: October 05, 1940

## 2019-11-18 NOTE — Therapy (Signed)
[place] MAIN [facility] SERVICES [address] [place], [place], [address] Phone: [phone]   Fax:  [phone]  Speech Language Pathology Treatment  Patient Details  Name: Joshua George MRN: [id number] Date of Birth: October 26, 1940 Referring Provider (SLP): Dr. Manuella Ghazi   Encounter Date: 11/18/2019  End of Session - 11/18/19 1510    Visit Number  14    Number of Visits  17    Date for SLP Re-Evaluation  11/15/19    Authorization Type  Medicare    Authorization Time Period  Start 11/04/2019    Authorization - Visit Number  4    Authorization - Number of Visits  10    SLP Start Time  1300    SLP Stop Time   1350    SLP Time Calculation (min)  50 min    Activity Tolerance  Patient tolerated treatment well       History reviewed. No pertinent past medical history.  History reviewed. No pertinent surgical history.  There were no vitals filed for this visit.  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    Currently in Pain?  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 The patient was asked to prolong a vowel x10. He was 80% accurate in producing a loud clear voice, and demonstrated increased awareness of his volume. He was prompted to do the vowel glide task and was 100% accurate on pitch variation, although decreased volume was noted on the task. He was prompted to read numbers with a focus on volume and controlled breathing. He was 70% accurate  independently. The main error was poor breath control (ie speaking on an inhale, running out of breath). The patient was educated on slowing down and grouping the numbers. The patient was also asked to read aloud expressive phrases. He was 85% accurate in generating a loud voice - his errors were always at the beginning of the phrase. Lastly, the patient was asked to read sentences of increasing length while maintaining proper volume. He was 73% accurate on maintaining volume. All errors were on  the final (and longest) sentence. He required multiple prompts on 60% of the final sentences (f=10) to improve his volume and speak louder. Lastly, the patient was asked to read numbers again. He demonstrated improved volume on the task, but again showed decreased volume as the task progressed/fatigue built. He was asked to repeat the last row of numbers with more volume on 3/3 prompts.      Assessment / Recommendations / Plan   Plan  Continue with current plan of care      Progression Toward Goals   Progression toward goals  Progressing toward goals       SLP Education - 11/18/19 1510    Education Details  improved loudness with breath support    Person(s) Educated  Patient    Methods  Explanation    Comprehension  Verbalized understanding         SLP Long Term Goals - 10/29/19 1134      SLP LONG TERM GOAL #1   Title  The patient will demonstrate independent understanding of vocal hygiene concepts and extrinsic laryngeal muscle stretches.    Status  Achieved      SLP LONG TERM GOAL #2   Title  The patient will be independent for abdominal breathing and breath support exercises.    Status  Achieved      SLP LONG TERM GOAL #3   Title  The patient will maximize voice quality and loudness using breath support/oral resonance for sustained vowel production, pitch glides, and hierarchal speech drill.    Status  Partially Met    Target Date  11/15/19      SLP LONG TERM GOAL #4   Title  The  patient will maximize voice quality and loudness using breath support/oral resonance for paragraph length recitation with 80% accuracy.    Status  Partially Met    Target Date  11/15/19       Plan - 11/18/19 1511    Clinical Impression Statement  The patient was able to recognize a soft voice versus a loud voice today with increased accuracy. The patient is able to generate a loud voice, especially near the end of sentences, but continues to struggle with initiation. The patient's spontaneous speech continues to improve, but he is able to generate volume more consistently and more accurately on automatic/structured speech tasks.    Speech Therapy Frequency  2x / week    Duration  Other (comment)    Treatment/Interventions  Other (comment);Patient/family education    Potential to Achieve Goals  Good    Potential Considerations  Ability to learn/carryover information;Previous level of function;Co-morbidities;Severity of impairments;Cooperation/participation level;Medical prognosis;Family/community support    SLP Home Exercise Plan  Provided    Consulted and Agree with Plan of Care  Patient       Patient will benefit from skilled therapeutic intervention in order to improve the following deficits and impairments:   Dysphonia    Problem List There are no problems to display for this patient.   Joshua George 11/18/2019, 3:14 PM  [place] [facility] MAIN [facility] SERVICES [address] [place], [place], [address] Phone: [phone]   Fax:  [phone]   Name: Joshua George MRN: [id number] Date of Birth: 1940-09-21

## 2019-11-20 ENCOUNTER — Other Ambulatory Visit: Payer: Self-pay

## 2019-11-20 ENCOUNTER — Ambulatory Visit: Payer: Medicare PPO | Admitting: Speech Pathology

## 2019-11-20 DIAGNOSIS — R490 Dysphonia: Secondary | ICD-10-CM

## 2019-11-21 ENCOUNTER — Encounter: Payer: Self-pay | Admitting: Speech Pathology

## 2019-11-21 NOTE — Therapy (Signed)
[place] [facility] REH[facility] SERVICES [address] [place], [place], [address] Phone: [phone]   Fax:  [phone]  Speech Language Pathology Treatment  Patient Details  Name: Joshua George MRN: [id number] Date of Birth: 06/16/1941 Referring Provider (SLP): Dr. Shah   Encounter Date: 11/20/2019  End of Session - 11/21/19 0956    Visit Number  15    Number of Visits  17    Date for SLP Re-Evaluation  11/15/19    Authorization Type  Medicare    Authorization Time Period  Start 11/04/2019    Authorization - Visit Number  5    Authorization - Number of Visits  10    SLP Start Time  1400    SLP Stop Time   1450    SLP Time Calculation (min)  50 min    Activity Tolerance  Patient tolerated treatment well       History reviewed. No pertinent past medical history.  History reviewed. No pertinent surgical history.  There were no vitals filed for this visit.  Subjective Assessment - 11/21/19 0955    Subjective  Patient reports he is heard more readily            ADULT SLP TREATMENT - 11/21/19 0001      General Information   Behavior/Cognition  Alert;Cooperative;Pleasant mood    HPI  78-year-old man diagnosed with Parkinson's disease with subsequent dysphonia and hypophonia       Treatment Provided   Treatment provided  Cognitive-Linquistic      Pain Assessment   Pain Assessment  No/denies pain      Cognitive-Linquistic Treatment   Treatment focused on  Voice    Skilled Treatment  VOICE BUILDING: 10 Loud "Ah" without strain, 10 pitch glides with improved pitch range range, generate sentences (picture stimulus, answer common knowledge questions) with loud and clear vocal quality X30 minutes.      Assessment / Recommendations / Plan   Plan  Continue with current plan of care      Progression Toward Goals   Progression toward goals  Progressing toward goals       SLP Education - 11/21/19 0956    Education Details  healthy  loudness    Person(s) Educated  Patient    Methods  Explanation    Comprehension  Verbalized understanding         SLP Long Term Goals - 10/29/19 1134      SLP LONG TERM GOAL #1   Title  The patient will demonstrate independent understanding of vocal hygiene concepts and extrinsic laryngeal muscle stretches.    Status  Achieved      SLP LONG TERM GOAL #2   Title  The patient will be independent for abdominal breathing and breath support exercises.    Status  Achieved      SLP LONG TERM GOAL #3   Title  The patient will maximize voice quality and loudness using breath support/oral resonance for sustained vowel production, pitch glides, and hierarchal speech drill.    Status  Partially Met    Target Date  11/15/19      SLP LONG TERM GOAL #4   Title  The patient will maximize voice quality and loudness using breath support/oral resonance for paragraph length recitation with 80% accuracy.    Status  Partially Met    Target Date  11/15/19       Plan - 11/21/19 0957    Clinical Impression Statement  The   patient is able to maintain a loud and clear voice for extended time, given external cuing.  He is demonstrating improved posture.    Speech Therapy Frequency  2x / week    Duration  Other (comment)    Treatment/Interventions  Other (comment);Patient/family education    Potential to Achieve Goals  Good       Patient will benefit from skilled therapeutic intervention in order to improve the following deficits and impairments:   Dysphonia    Problem List There are no problems to display for this patient.  [name], MS/CCC- SLP  [name] 11/21/2019, 9:58 AM  [place] MAIN [facility] SERVICES [address] [place], [place], [address] Phone: [phone]   Fax:  [phone]   Name: Joshua George MRN: [id number] Date of Birth: [birth year]

## 2019-11-25 ENCOUNTER — Other Ambulatory Visit: Payer: Self-pay

## 2019-11-25 ENCOUNTER — Ambulatory Visit: Payer: Medicare PPO | Admitting: Speech Pathology

## 2019-11-25 ENCOUNTER — Encounter: Payer: Self-pay | Admitting: Speech Pathology

## 2019-11-25 ENCOUNTER — Ambulatory Visit: Payer: Medicare PPO

## 2019-11-25 DIAGNOSIS — R490 Dysphonia: Secondary | ICD-10-CM

## 2019-11-25 NOTE — Therapy (Signed)
[place] MAIN [facility] SERVICES [address] [place], [place], [address] Phone: [phone]   Fax:  [phone]  Speech Language Pathology Treatment  Patient Details  Name: Joshua George MRN: [id number] Date of Birth: 08-24-1941 Referring Provider (SLP): Dr. Manuella Ghazi   Encounter Date: 11/25/2019  End of Session - 11/25/19 1618    Visit Number  16    Number of Visits  17    Date for SLP Re-Evaluation  11/15/19    Authorization Type  Medicare    Authorization Time Period  Start 11/04/2019    Authorization - Visit Number  6    Authorization - Number of Visits  10    SLP Start Time  1400    SLP Stop Time   1450    SLP Time Calculation (min)  50 min    Activity Tolerance  Patient tolerated treatment well       History reviewed. No pertinent past medical history.  History reviewed. No pertinent surgical history.  There were no vitals filed for this visit.  Subjective Assessment - 11/25/19 1617    Subjective  Patient reports he is heard more readily    Currently in Pain?  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 He was able to generate 5 reps of 5 short, expressive sentences with loud voice, no strain. He was asked to count in groups of 3 and 4. He accurately generated groups of 3 coordinating his breath, but had difficulty with groups of 4, especially on the final sets, and often ran out of breath. When prompted to count to ten (ungrouped,  naturally), patient generated two sets with a loud voice without strain. The patient was prompted to read phonetically similar sentences with an emphasis on clear articulation. He did so with excellent volume (unprompted) and required 3 prompts to slow down his speech to improve articulation. The patient demonstrated some loud volume during a spontaneous speech task, although he was noted to decrease in volume as the task progressed.      Assessment / Recommendations / Plan   Plan  Continue with current plan of care      Progression Toward Goals   Progression toward goals  Progressing toward goals       SLP Education - 11/25/19 1618    Education Details  clear articulation    Person(s) Educated  Patient    Methods  Explanation    Comprehension  Verbalized understanding         SLP Long Term Goals - 10/29/19 1134      SLP LONG TERM GOAL #1   Title  The patient will demonstrate independent understanding of vocal hygiene concepts and extrinsic laryngeal muscle stretches.    Status  Achieved      SLP LONG TERM GOAL #2   Title  The patient will be independent for abdominal breathing and breath support  exercises.    Status  Achieved      SLP LONG TERM GOAL #3   Title  The patient will maximize voice quality and loudness using breath support/oral resonance for sustained vowel production, pitch glides, and hierarchal speech drill.    Status  Partially Met    Target Date  11/15/19      SLP LONG TERM GOAL #4   Title  The patient will maximize voice quality and loudness using breath support/oral resonance for paragraph length recitation with 80% accuracy.    Status  Partially Met    Target Date  11/15/19       Plan - 11/25/19 1619    Clinical Impression Statement  The patient is able to maintain a loud and clear voice for extended time, given external cuing.  He is demonstrating improved posture. Also noted to self-correct independently to generate a louder voice.    Speech Therapy  Frequency  2x / week    Duration  Other (comment)    Treatment/Interventions  Other (comment);Patient/family education    Potential to Achieve Goals  Good    Potential Considerations  Ability to learn/carryover information;Previous level of function;Co-morbidities;Severity of impairments;Cooperation/participation level;Medical prognosis;Family/community support    Consulted and Agree with Plan of Care  Patient       Patient will benefit from skilled therapeutic intervention in order to improve the following deficits and impairments:   Dysphonia    Problem List There are no problems to display for this patient.   [name] 11/25/2019, 4:21 PM  [place] MAIN [facility] SERVICES [address] [place], [place], [address] Phone: [phone]   Fax:  [phone]   Name: Joshua George MRN: [id number] Date of Birth: 1941/01/18

## 2019-11-27 ENCOUNTER — Ambulatory Visit: Payer: Medicare PPO | Admitting: Speech Pathology

## 2019-11-27 ENCOUNTER — Ambulatory Visit: Payer: Medicare PPO

## 2019-12-02 ENCOUNTER — Ambulatory Visit: Payer: Medicare PPO | Admitting: Speech Pathology

## 2019-12-02 ENCOUNTER — Encounter: Payer: Self-pay | Admitting: Speech Pathology

## 2019-12-02 ENCOUNTER — Ambulatory Visit: Payer: Medicare PPO

## 2019-12-02 ENCOUNTER — Other Ambulatory Visit: Payer: Self-pay

## 2019-12-02 DIAGNOSIS — R490 Dysphonia: Secondary | ICD-10-CM | POA: Diagnosis not present

## 2019-12-02 NOTE — Therapy (Signed)
[place] MAIN [facility] SERVICES [address] [place], [place], [address] Phone: [phone]   Fax:  [phone]  Speech Language Pathology Treatment  Patient Details  Name: Joshua George MRN: [id number] Date of Birth: 14-Feb-1941 Referring Provider (SLP): Dr. Manuella Ghazi   Encounter Date: 12/02/2019  End of Session - 12/02/19 1706    Visit Number  17    Number of Visits  21    Date for SLP Re-Evaluation  12/06/19    Authorization Type  Medicare    Authorization Time Period  Start 11/04/2019    Authorization - Visit Number  7    Authorization - Number of Visits  10    SLP Start Time  1400    SLP Stop Time   1450    SLP Time Calculation (min)  50 min    Activity Tolerance  Patient tolerated treatment well       History reviewed. No pertinent past medical history.  History reviewed. No pertinent surgical history.  There were no vitals filed for this visit.  Subjective Assessment - 12/02/19 1706    Subjective  Patient reports he is heard more readily    Currently in Pain?  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 Given a direct model, patient generated 2-, 3-, 4-, 5-, and 6-syllable phrases with a loud voice, no strain. He required some additional repetitions on phrases with more syllables. He read 7-syllable phrases (f=10) with no model and was 70% accurate in generating a loud voice. He generated 20 questions with excellent volume but  required additional cueing for facial expression. He was prompted to read tongue twisters and did so with excellent articulation and volume.       Assessment / Recommendations / Plan   Plan  Continue with current plan of care      Progression Toward Goals   Progression toward goals  Progressing toward goals       SLP Education - 12/02/19 1706    Education Details  volume and facial expression    Person(s) Educated  Patient    Methods  Explanation    Comprehension  Verbalized understanding         SLP Long Term Goals - 11/26/19 0847      SLP LONG TERM GOAL #3   Title  The patient will maximize voice quality and loudness using breath support/oral resonance for sustained vowel production, pitch glides, and hierarchal speech drill.    Time  2    Period  Weeks    Status  Partially Met    Target Date  12/06/19      SLP LONG TERM GOAL #4   Title  The patient will maximize voice quality and loudness using breath support/oral resonance for paragraph length recitation with 80% accuracy.    Time  2    Period  Weeks    Status  Partially Met    Target Date  12/06/19       Plan - 12/02/19 1707    Clinical Impression Statement  The patient is able to maintain a loud and clear voice for extended time, given external cuing.  He is demonstrating improved posture. Also noted to self-correct independently to generate a louder voice. The patient reports improved intelligibilty with different communication partners, including his sister-in-law commenting that he sounds more like himself than he has in years during a recent phone call.    Speech Therapy Frequency  2x / week    Duration  Other (comment)    Treatment/Interventions  Other (comment);Patient/family education    Potential to Achieve Goals  Good    Potential Considerations  Ability to learn/carryover information;Previous level of function;Co-morbidities;Severity of impairments;Cooperation/participation level;Medical  prognosis;Family/community support    SLP Home Exercise Plan  Provided    Consulted and Agree with Plan of Care  Patient       Patient will benefit from skilled therapeutic intervention in order to improve the following deficits and impairments:   Dysphonia    Problem List There are no problems to display for this patient.   Joshua George 12/02/2019, 5:09 PM  [place] MAIN [facility] SERVICES [address] [place], [place], [address] Phone: [phone]   Fax:  [phone]   Name: Joshua George MRN: [id number] Date of Birth: [birth year]

## 2019-12-04 ENCOUNTER — Encounter: Payer: Self-pay | Admitting: Speech Pathology

## 2019-12-04 ENCOUNTER — Ambulatory Visit: Payer: Medicare PPO | Admitting: Speech Pathology

## 2019-12-04 ENCOUNTER — Other Ambulatory Visit: Payer: Self-pay

## 2019-12-04 DIAGNOSIS — R490 Dysphonia: Secondary | ICD-10-CM | POA: Diagnosis not present

## 2019-12-04 NOTE — Therapy (Signed)
[place] MAIN [facility] SERVICES [address] [place], [place], [address] Phone: [phone]   Fax:  [phone]  Speech Language Pathology Treatment/Discharge Summary  Patient Details  Name: Joshua George MRN: [id number] Date of Birth: 1941/01/25 Referring Provider (SLP): Dr. Manuella Ghazi   Encounter Date: 12/04/2019  End of Session - 12/04/19 1620    Visit Number  18    Number of Visits  21    Date for SLP Re-Evaluation  12/06/19    Authorization Type  Medicare    Authorization Time Period  Start 11/04/2019    Authorization - Visit Number  8    Authorization - Number of Visits  10    SLP Start Time  1400    SLP Stop Time   1450    SLP Time Calculation (min)  50 min    Activity Tolerance  Patient tolerated treatment well       History reviewed. No pertinent past medical history.  History reviewed. No pertinent surgical history.  There were no vitals filed for this visit.  Subjective Assessment - 12/04/19 1616    Subjective  Patient reports he is able to generate a loud voice when he thinks about it.            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 Given a direct model, patient generated 150  2-, 3-, 4-, 5-, 6- and 7-syllable phrases with a loud voice, no strain. He was 78% accurate given a direct model with no cues. He generated 20 questions with excellent volume but required additional cueing for facial expression on 50% of questions. He was prompted to read  tongue twisters and did so with excellent articulation and volume. The patient engaged in a spontaneous speech task and required maximal cues to generate a louder voice.       Assessment / Recommendations / Plan   Plan Discharge     Progression Toward Goals   Progression toward goals  Progressing toward goals           SLP Long Term Goals - 12/05/19 0819      SLP LONG TERM GOAL #3   Title  The patient will maximize voice quality and loudness using breath support/oral resonance for sustained vowel production, pitch glides, and hierarchal speech drill.    Status  Achieved      SLP LONG TERM GOAL #4   Title  The patient will maximize voice quality and loudness using breath support/oral resonance for paragraph length recitation with 80% accuracy.    Status  Partially Met       Plan - 12/04/19 1620    Clinical Impression Statement  The patient has benefitted from therapy and has shown improved loudness on structured speech tasks. However, his loudness has yet to generalize to spontaneous speech tasks. The patient has to discontinue speech services due to mandibular surgery on 12/09/19 but has expressed an interest in continuing therapy after recovering from  his surgery. Should he choose to continue speech therapy, the patient is an excellent candidate for LSVT LOUD therapy and should consider this type of treatment. LSVT LOUD is an intensive speech therapy treatment for individuals with Parkinson's Disease and would be beneficial in aiding him in generalizing his loud voice to spontaneous speech.    Speech Therapy Frequency  2x / week    Duration  Other (comment)    Treatment/Interventions  Other (comment);Patient/family education    Potential to Achieve Goals  Good    Potential Considerations  Ability to learn/carryover information;Previous level of function;Co-morbidities;Severity of impairments;Cooperation/participation level;Medical prognosis;Family/community support    SLP Home  Exercise Plan  Provided    Consulted and Agree with Plan of Care  Patient       Patient will benefit from skilled therapeutic intervention in order to improve the following deficits and impairments:   Dysphonia    Problem List There are no problems to display for this patient.   [name] 12/05/2019, 8:20 AM  [place] MAIN [facility] SERVICES [place], [place], [address] Phone: [phone]   Fax:  [phone]   Name: Charvez Voorhies MRN: [id number] Date of Birth: [birth year]

## 2019-12-09 ENCOUNTER — Encounter: Payer: Medicare PPO | Admitting: Speech Pathology

## 2019-12-11 ENCOUNTER — Encounter: Payer: Medicare PPO | Admitting: Speech Pathology

## 2019-12-16 ENCOUNTER — Encounter: Payer: Medicare PPO | Admitting: Speech Pathology

## 2019-12-18 ENCOUNTER — Encounter: Payer: Medicare PPO | Admitting: Speech Pathology

## 2019-12-23 ENCOUNTER — Encounter: Payer: Medicare PPO | Admitting: Speech Pathology

## 2019-12-25 ENCOUNTER — Encounter: Payer: Medicare PPO | Admitting: Speech Pathology

## 2019-12-30 ENCOUNTER — Encounter: Payer: Medicare PPO | Admitting: Speech Pathology

## 2020-01-01 ENCOUNTER — Encounter: Payer: Medicare PPO | Admitting: Speech Pathology

## 2020-01-06 ENCOUNTER — Encounter: Payer: Medicare PPO | Admitting: Speech Pathology

## 2020-01-08 ENCOUNTER — Encounter: Payer: Medicare PPO | Admitting: Speech Pathology

## 2020-10-20 ENCOUNTER — Other Ambulatory Visit: Payer: Self-pay

## 2020-10-20 ENCOUNTER — Inpatient Hospital Stay: Payer: Medicare PPO

## 2020-10-20 ENCOUNTER — Emergency Department: Payer: Medicare PPO

## 2020-10-20 ENCOUNTER — Encounter: Payer: Self-pay | Admitting: Emergency Medicine

## 2020-10-20 ENCOUNTER — Inpatient Hospital Stay
Admission: EM | Admit: 2020-10-20 | Discharge: 2020-10-28 | DRG: 682 | Disposition: A | Discharge status: Skilled Nursing Facility | Payer: Medicare PPO | Attending: Internal Medicine | Admitting: Internal Medicine

## 2020-10-20 DIAGNOSIS — R1312 Dysphagia, oropharyngeal phase: Secondary | ICD-10-CM | POA: Diagnosis present

## 2020-10-20 DIAGNOSIS — J9811 Atelectasis: Secondary | ICD-10-CM | POA: Diagnosis present

## 2020-10-20 DIAGNOSIS — N179 Acute kidney failure, unspecified: Secondary | ICD-10-CM | POA: Diagnosis present

## 2020-10-20 DIAGNOSIS — E871 Hypo-osmolality and hyponatremia: Secondary | ICD-10-CM | POA: Diagnosis present

## 2020-10-20 DIAGNOSIS — I509 Heart failure, unspecified: Secondary | ICD-10-CM | POA: Diagnosis not present

## 2020-10-20 DIAGNOSIS — K219 Gastro-esophageal reflux disease without esophagitis: Secondary | ICD-10-CM | POA: Diagnosis present

## 2020-10-20 DIAGNOSIS — Z20822 Contact with and (suspected) exposure to covid-19: Secondary | ICD-10-CM | POA: Diagnosis present

## 2020-10-20 DIAGNOSIS — Z9889 Other specified postprocedural states: Secondary | ICD-10-CM

## 2020-10-20 DIAGNOSIS — G20 Parkinson's disease: Secondary | ICD-10-CM | POA: Diagnosis present

## 2020-10-20 DIAGNOSIS — J918 Pleural effusion in other conditions classified elsewhere: Secondary | ICD-10-CM | POA: Diagnosis present

## 2020-10-20 DIAGNOSIS — I340 Nonrheumatic mitral (valve) insufficiency: Secondary | ICD-10-CM | POA: Diagnosis not present

## 2020-10-20 DIAGNOSIS — Z681 Body mass index (BMI) 19 or less, adult: Secondary | ICD-10-CM

## 2020-10-20 DIAGNOSIS — R0602 Shortness of breath: Secondary | ICD-10-CM

## 2020-10-20 DIAGNOSIS — R338 Other retention of urine: Secondary | ICD-10-CM | POA: Diagnosis present

## 2020-10-20 DIAGNOSIS — I700 Atherosclerosis of aorta: Secondary | ICD-10-CM | POA: Diagnosis present

## 2020-10-20 DIAGNOSIS — Z66 Do not resuscitate: Secondary | ICD-10-CM | POA: Diagnosis present

## 2020-10-20 DIAGNOSIS — J189 Pneumonia, unspecified organism: Secondary | ICD-10-CM | POA: Diagnosis present

## 2020-10-20 DIAGNOSIS — G20A1 Parkinson's disease without dyskinesia, without mention of fluctuations: Secondary | ICD-10-CM | POA: Diagnosis present

## 2020-10-20 DIAGNOSIS — N401 Enlarged prostate with lower urinary tract symptoms: Secondary | ICD-10-CM | POA: Diagnosis present

## 2020-10-20 DIAGNOSIS — N136 Pyonephrosis: Secondary | ICD-10-CM | POA: Diagnosis present

## 2020-10-20 DIAGNOSIS — R4182 Altered mental status, unspecified: Secondary | ICD-10-CM

## 2020-10-20 DIAGNOSIS — I361 Nonrheumatic tricuspid (valve) insufficiency: Secondary | ICD-10-CM | POA: Diagnosis not present

## 2020-10-20 DIAGNOSIS — E43 Unspecified severe protein-calorie malnutrition: Secondary | ICD-10-CM | POA: Diagnosis present

## 2020-10-20 DIAGNOSIS — F0280 Dementia in other diseases classified elsewhere without behavioral disturbance: Secondary | ICD-10-CM | POA: Diagnosis present

## 2020-10-20 DIAGNOSIS — E870 Hyperosmolality and hypernatremia: Secondary | ICD-10-CM | POA: Diagnosis not present

## 2020-10-20 DIAGNOSIS — N3289 Other specified disorders of bladder: Secondary | ICD-10-CM | POA: Diagnosis present

## 2020-10-20 DIAGNOSIS — R471 Dysarthria and anarthria: Secondary | ICD-10-CM | POA: Diagnosis present

## 2020-10-20 DIAGNOSIS — I351 Nonrheumatic aortic (valve) insufficiency: Secondary | ICD-10-CM | POA: Diagnosis not present

## 2020-10-20 DIAGNOSIS — J90 Pleural effusion, not elsewhere classified: Secondary | ICD-10-CM

## 2020-10-20 HISTORY — DX: Unspecified dementia, unspecified severity, without behavioral disturbance, psychotic disturbance, mood disturbance, and anxiety: F03.90

## 2020-10-20 HISTORY — DX: Parkinson's disease: G20

## 2020-10-20 HISTORY — DX: Gastro-esophageal reflux disease without esophagitis: K21.9

## 2020-10-20 HISTORY — DX: Parkinson's disease without dyskinesia, without mention of fluctuations: G20.A1

## 2020-10-20 HISTORY — DX: Benign prostatic hyperplasia without lower urinary tract symptoms: N40.0

## 2020-10-20 LAB — COMPREHENSIVE METABOLIC PANEL
ALT: <5 U/L (ref 0–44)
AST: 14 U/L — ABNORMAL LOW (ref 15–41)
Albumin: 3.3 g/dL — ABNORMAL LOW (ref 3.5–5.0)
Alkaline Phosphatase: 91 U/L (ref 38–126)
Anion gap: 16 — ABNORMAL HIGH (ref 5–15)
BUN: 118 mg/dL — ABNORMAL HIGH (ref 8–23)
CO2: 22 mmol/L (ref 22–32)
Calcium: 8.8 mg/dL — ABNORMAL LOW (ref 8.9–10.3)
Chloride: 91 mmol/L — ABNORMAL LOW (ref 98–111)
Creatinine, Ser: 8.87 mg/dL — ABNORMAL HIGH (ref 0.61–1.24)
GFR, Estimated: 6 mL/min — ABNORMAL LOW (ref >60)
Glucose, Bld: 92 mg/dL (ref 70–99)
Potassium: 5.1 mmol/L (ref 3.5–5.1)
Sodium: 129 mmol/L — ABNORMAL LOW (ref 135–145)
Total Bilirubin: 1.4 mg/dL — ABNORMAL HIGH (ref 0.3–1.2)
Total Protein: 7.1 g/dL (ref 6.5–8.1)

## 2020-10-20 LAB — URINALYSIS, COMPLETE (UACMP) WITH MICROSCOPIC
Bacteria, UA: NONE SEEN
Bilirubin Urine: NEGATIVE
Glucose, UA: NEGATIVE mg/dL
Ketones, ur: 5 mg/dL — AB
Leukocytes,Ua: NEGATIVE
Nitrite: NEGATIVE
Protein, ur: NEGATIVE mg/dL
RBC / HPF: >50 RBC/hpf — ABNORMAL HIGH (ref 0–5)
Specific Gravity, Urine: 1.015 (ref 1.005–1.030)
Squamous Epithelial / HPF: NONE SEEN (ref 0–5)
pH: 5 (ref 5.0–8.0)

## 2020-10-20 LAB — CBC WITH DIFFERENTIAL/PLATELET
Abs Immature Granulocytes: 0.08 10*3/uL — ABNORMAL HIGH (ref 0.00–0.07)
Basophils Absolute: 0 10*3/uL (ref 0.0–0.1)
Basophils Relative: 0 %
Eosinophils Absolute: 0.1 10*3/uL (ref 0.0–0.5)
Eosinophils Relative: 1 %
HCT: 40.8 % (ref 39.0–52.0)
Hemoglobin: 14 g/dL (ref 13.0–17.0)
Immature Granulocytes: 1 %
Lymphocytes Relative: 3 %
Lymphs Abs: 0.4 10*3/uL — ABNORMAL LOW (ref 0.7–4.0)
MCH: 30.1 pg (ref 26.0–34.0)
MCHC: 34.3 g/dL (ref 30.0–36.0)
MCV: 87.7 fL (ref 80.0–100.0)
Monocytes Absolute: 1.6 10*3/uL — ABNORMAL HIGH (ref 0.1–1.0)
Monocytes Relative: 11 %
Neutro Abs: 11.5 10*3/uL — ABNORMAL HIGH (ref 1.7–7.7)
Neutrophils Relative %: 84 %
Platelets: 186 10*3/uL (ref 150–400)
RBC: 4.65 MIL/uL (ref 4.22–5.81)
RDW: 12.8 % (ref 11.5–15.5)
WBC: 13.7 10*3/uL — ABNORMAL HIGH (ref 4.0–10.5)
nRBC: 0 % (ref 0.0–0.2)

## 2020-10-20 LAB — PROTEIN, PLEURAL OR PERITONEAL FLUID: Total protein, fluid: <3 g/dL

## 2020-10-20 LAB — TROPONIN I (HIGH SENSITIVITY)
Troponin I (High Sensitivity): 17 ng/L (ref <18)
Troponin I (High Sensitivity): 16 ng/L (ref <18)

## 2020-10-20 LAB — RESP PANEL BY RT-PCR (FLU A&B, COVID) ARPGX2
Influenza A by PCR: NEGATIVE
Influenza B by PCR: NEGATIVE
SARS Coronavirus 2 by RT PCR: NEGATIVE

## 2020-10-20 LAB — GLUCOSE, PLEURAL OR PERITONEAL FLUID: Glucose, Fluid: 84 mg/dL

## 2020-10-20 LAB — ALBUMIN, PLEURAL OR PERITONEAL FLUID: Albumin, Fluid: <1 g/dL

## 2020-10-20 LAB — LACTIC ACID, PLASMA
Lactic Acid, Venous: 1.3 mmol/L (ref 0.5–1.9)
Lactic Acid, Venous: 1.3 mmol/L (ref 0.5–1.9)

## 2020-10-20 MED ORDER — CHLORHEXIDINE GLUCONATE CLOTH 2 % EX PADS
6.0000 | MEDICATED_PAD | Freq: Every day | CUTANEOUS | Status: DC
  Administered 2020-10-22 – 2020-10-28 (×7): 6 via TOPICAL

## 2020-10-20 MED ORDER — SODIUM CHLORIDE 0.9% FLUSH: 3.0000 mL | INTRAVENOUS | Status: DC | PRN

## 2020-10-20 MED ORDER — ACETAMINOPHEN 650 MG RE SUPP
650.0000 mg | Freq: Four times a day (QID) | RECTAL | Status: DC | PRN
  Administered 2020-10-22: 650 mg via RECTAL
  Filled 2020-10-20 (×3): qty 1

## 2020-10-20 MED ORDER — ONDANSETRON HCL 4 MG/2ML IJ SOLN: 4.0000 mg | Freq: Four times a day (QID) | INTRAMUSCULAR | Status: DC | PRN

## 2020-10-20 MED ORDER — SODIUM CHLORIDE 0.9% FLUSH
3.0000 mL | Freq: Two times a day (BID) | INTRAVENOUS | Status: DC
  Administered 2020-10-20 – 2020-10-28 (×16): 3 mL via INTRAVENOUS

## 2020-10-20 MED ORDER — SODIUM CHLORIDE 0.9 % IV SOLN
500.0000 mg | INTRAVENOUS | Status: DC
  Filled 2020-10-20: qty 500

## 2020-10-20 MED ORDER — SODIUM CHLORIDE 0.9 % IV SOLN
500.0000 mg | INTRAVENOUS | Status: AC
  Administered 2020-10-20 – 2020-10-22 (×3): 500 mg via INTRAVENOUS
  Filled 2020-10-20 (×3): qty 500

## 2020-10-20 MED ORDER — ONDANSETRON HCL 4 MG PO TABS: 4.0000 mg | ORAL_TABLET | Freq: Four times a day (QID) | ORAL | Status: DC | PRN

## 2020-10-20 MED ORDER — SODIUM CHLORIDE 0.9 % IV SOLN
2.0000 g | INTRAVENOUS | Status: DC
  Filled 2020-10-20: qty 20

## 2020-10-20 MED ORDER — ORAL CARE MOUTH RINSE
15.0000 mL | Freq: Two times a day (BID) | OROMUCOSAL | Status: DC
  Administered 2020-10-22 – 2020-10-28 (×12): 15 mL via OROMUCOSAL

## 2020-10-20 MED ORDER — SODIUM CHLORIDE 0.9 % IV SOLN
2.0000 g | INTRAVENOUS | Status: DC
  Administered 2020-10-20 – 2020-10-22 (×3): 2 g via INTRAVENOUS
  Filled 2020-10-20 (×2): qty 2
  Filled 2020-10-20: qty 20

## 2020-10-20 MED ORDER — HEPARIN SODIUM (PORCINE) 5000 UNIT/ML IJ SOLN
5000.0000 [IU] | Freq: Three times a day (TID) | INTRAMUSCULAR | Status: DC
  Administered 2020-10-20 – 2020-10-28 (×24): 5000 [IU] via SUBCUTANEOUS
  Filled 2020-10-20 (×24): qty 1

## 2020-10-20 MED ORDER — ACETAMINOPHEN 325 MG PO TABS: 650.0000 mg | ORAL_TABLET | Freq: Four times a day (QID) | ORAL | Status: DC | PRN

## 2020-10-20 MED ORDER — SODIUM CHLORIDE 0.9 % IV SOLN: 250.0000 mL | INTRAVENOUS | Status: DC | PRN

## 2020-10-20 MED ORDER — SODIUM CHLORIDE 0.9 % IV SOLN: INTRAVENOUS | Status: DC

## 2020-10-20 MED ORDER — SODIUM CHLORIDE 0.9 % IV BOLUS
1000.0000 mL | Freq: Once | INTRAVENOUS | Status: AC
  Administered 2020-10-20: 1000 mL via INTRAVENOUS

## 2020-10-20 NOTE — ED Triage Notes (Signed)
Pt comes into the ED via [facility] from home c/o increased weakness, agitation, and hallucinations.  Pt has h/o parkinsons and dementia.  Pt dx with UTI last week and started on abx.  Pt did run out of Parkinson medication for about 4 days last week. Pt still c/o back and left side pain.  EMS vitals were 169/96, 97% on 1 L, 124 CBG, 18g R forearm.

## 2020-10-20 NOTE — Plan of Care (Signed)

## 2020-10-20 NOTE — ED Notes (Signed)
Radiology at bedside

## 2020-10-20 NOTE — ED Notes (Signed)
Admission MD at bedside.  

## 2020-10-20 NOTE — H&P (Addendum)
History and Physical    Joshua George YTK:[id number] DOB: Jul 12, 1941 DOA: 10/20/2020  PCP: Kandyce Rud, MD   Patient coming from: Home  I have personally briefly reviewed patient's old medical records in [facility]  Chief Complaint: Weakness   Most of the history was obtained from patient's wife at the bedside. HPI: Joshua George is a 80 y.o. male with medical history significant for Parkinson's disease, GERD, BPH and dementia who presents to the ER by EMS for evaluation of progressively worsening generalized weakness requiring a 1 person assist which is unusual for the patient.  The wife also states that he has been confused his speech has been slurred over the last 24 hours.  Per his wife he has been out of his clonazepam for couple of days and this was recently resumed by his neurologist.  He was also started on Macrobid for a presumed urinary tract infection.  She notes that over the last couple of days his oral intake has been poor and he has been very weak.  Patient complains of pain in the left flank area as well as his back for about 3 days.  His wife states that he fell 1 day prior to being hospitalized but was able to get up without any difficulty. He has an occasional productive cough but denies having any shortness of breath. He denies having any fever or chills, he has no nausea, no vomiting, no diarrhea, no dizziness, no lightheadedness, no abdominal pain, no urinary frequency, no dysuria, no nocturia, no palpitations, no diaphoresis, no focal deficits, no blurred vision, no difficulty swallowing. Labs show sodium 129, potassium 5.1, chloride 91, bicarb 22, glucose 92, BUN 118 compared to baseline of 24, creatinine 8.87 compared to baseline of 1.1, anion gap of 16, alkaline phosphatase 91, albumin 3.3, AST 14, ALT less than 5, total protein 7.1, bilirubin 1.4, troponin XVI, lactic acid 1.3, white count 13.7, hemoglobin 14.0, hematocrit 40.8, MCV 87.7, RDW 12.8, platelet count  186 Urinalysis is sterile Respiratory viral panel is negative Chest x-ray reviewed by me shows large left-sided pleural effusion with likely underlying atelectasis/infiltrate. Small right-sided pleural effusion is noted as well. CT scan of the chest without contrast shows confluent collapse/consolidation in the lingula and left lower lobe with moderate to large left and small right pleural effusions. Assessment of fine architectural detail in the lungs is obscured by breathing motion. Scattered areas of architectural distortion and ground-glass opacity bilaterally. Imaging features likely related to an infectious/inflammatory etiology. Possible right hydronephrosis with prominent left perinephric edema. CT of the abdomen and pelvis with contrast, if possible, recommended to further evaluate. Bilateral nephrolithiasis. Aortic Atherosclerosis Twelve-lead EKG reviewed by me shows sinus rhythm with LVH and T wave abnormalities in the inferior leads.   ED Course: Patient is a 80 year old male with a history of Parkinson's disease and dementia who was brought into the ER by EMS for evaluation of weakness and mental status changes.  Patient is noted to have acute kidney injury, his serum creatinine on admission is 8.87 compared to baseline of 1.1, BUN is elevated at 118 compared to baseline of 28 from 01/22.  Imaging shows bilateral pleural effusion left greater than right with consolidation in the left lower lobe.  On physical exam patient was noted to have a distended bladder and a Foley catheter was placed in the ER with drainage of 1800 mL of urine.  Patient will be admitted to the hospital for further evaluation and treatment     Review  of Systems: As per HPI otherwise all other systems reviewed and negative.    Past Medical History:  Diagnosis Date  . BPH (benign prostatic hyperplasia)   . Dementia (HCC)   . GERD (gastroesophageal reflux disease)   . Parkinson's disease [facility])     Past  Surgical History:  Procedure Laterality Date  . TONSILLECTOMY       reports that he has never smoked. He has never used smokeless tobacco. He reports previous alcohol use. No history on file for drug use.  No Known Allergies  History reviewed. No pertinent family history.    Prior to Admission medications   Not on File    Physical Exam: Vitals:   10/20/20 0849 10/20/20 0900 10/20/20 0930 10/20/20 1030  BP: (!) 167/90 (!) 158/85 (!) 175/87 (!) 163/96  Pulse: 83 80 83 83  Resp: 20 (!) 22 (!) 22 (!) 22  Temp: (!) 97.4 F (36.3 C)     TempSrc: Oral     SpO2: 91% 95% 93% 95%  Weight: 65.8 kg     Height: 5\' 8"  (1.727 m)        Vitals:   10/20/20 0849 10/20/20 0900 10/20/20 0930 10/20/20 1030  BP: (!) 167/90 (!) 158/85 (!) 175/87 (!) 163/96  Pulse: 83 80 83 83  Resp: 20 (!) 22 (!) 22 (!) 22  Temp: (!) 97.4 F (36.3 C)     TempSrc: Oral     SpO2: 91% 95% 93% 95%  Weight: 65.8 kg     Height: 5\' 8"  (1.727 m)         Constitutional: Alert and oriented x 3 . Not in any apparent distress HEENT:      Head: Normocephalic and atraumatic.         Eyes: PERLA, EOMI, Conjunctivae are normal. Sclera is non-icteric.       Mouth/Throat: Mucous membranes are dry      Neck: Supple with no signs of meningismus. Cardiovascular: Regular rate and rhythm. No murmurs, gallops, or rubs. 2+ symmetrical distal pulses are present . No JVD. No LE edema Respiratory: Respiratory effort normal .Lungs sounds decreased over left lung field with scattered rhonchi, clear over right lung fields. No wheezes, crackles, or rhonchi.  Gastrointestinal: Soft, non tender, and non distended with positive bowel sounds.  Genitourinary: No CVA tenderness. Musculoskeletal: Nontender with normal range of motion in all extremities. No cyanosis, or erythema of extremities. Neurologic:  Face is symmetric. Moving all extremities. No gross focal neurologic deficits  Skin: Skin is warm, dry.  No rash or  ulcers Psychiatric: Mood and affect are normal   Labs on Admission: I have personally reviewed following labs and imaging studies  CBC: Recent Labs  Lab 10/20/20 0848  WBC 13.7*  NEUTROABS 11.5*  HGB 14.0  HCT 40.8  MCV 87.7  PLT 186   Basic Metabolic Panel: Recent Labs  Lab 10/20/20 0848  NA 129*  K 5.1  CL 91*  CO2 22  GLUCOSE 92  BUN 118*  CREATININE 8.87*  CALCIUM 8.8*   GFR: Estimated Creatinine Clearance: 6.4 mL/min (A) (by C-G formula based on SCr of 8.87 mg/dL (H)). Liver Function Tests: Recent Labs  Lab 10/20/20 0848  AST 14*  ALT <5  ALKPHOS 91  BILITOT 1.4*  PROT 7.1  ALBUMIN 3.3*   No results for input(s): LIPASE, AMYLASE in the last 168 hours. No results for input(s): AMMONIA in the last 168 hours. Coagulation Profile: No results for input(s): INR, PROTIME in  the last 168 hours. Cardiac Enzymes: No results for input(s): CKTOTAL, CKMB, CKMBINDEX, TROPONINI in the last 168 hours. BNP (last 3 results) No results for input(s): PROBNP in the last 8760 hours. HbA1C: No results for input(s): HGBA1C in the last 72 hours. CBG: No results for input(s): GLUCAP in the last 168 hours. Lipid Profile: No results for input(s): CHOL, HDL, LDLCALC, TRIG, CHOLHDL, LDLDIRECT in the last 72 hours. Thyroid Function Tests: No results for input(s): TSH, T4TOTAL, FREET4, T3FREE, THYROIDAB in the last 72 hours. Anemia Panel: No results for input(s): VITAMINB12, FOLATE, FERRITIN, TIBC, IRON, RETICCTPCT in the last 72 hours. Urine analysis:    Component Value Date/Time   COLORURINE AMBER (A) 10/20/2020 1135   APPEARANCEUR HAZY (A) 10/20/2020 1135   LABSPEC 1.015 10/20/2020 1135   PHURINE 5.0 10/20/2020 1135   GLUCOSEU NEGATIVE 10/20/2020 1135   HGBUR LARGE (A) 10/20/2020 1135   BILIRUBINUR NEGATIVE 10/20/2020 1135   KETONESUR 5 (A) 10/20/2020 1135   PROTEINUR NEGATIVE 10/20/2020 1135   NITRITE NEGATIVE 10/20/2020 1135   LEUKOCYTESUR NEGATIVE 10/20/2020 1135     Radiological Exams on Admission: CT CHEST WO CONTRAST  Result Date: 10/20/2020 CLINICAL DATA:  Weakness and confusion. EXAM: CT CHEST WITHOUT CONTRAST TECHNIQUE: Multidetector CT imaging of the chest was performed following the standard protocol without IV contrast. COMPARISON:  None. FINDINGS: Cardiovascular: Heart size upper normal. No substantial pericardial effusion. Atherosclerotic calcification is noted in the wall of the thoracic aorta. Mediastinum/Nodes: Assessment limited by motion and lack of intravenous contrast material. No mediastinal lymphadenopathy. Left hilum obscured by fluid and lung collapse. No definite right hilar lymphadenopathy on noncontrast imaging. The esophagus has normal imaging features. There is no axillary lymphadenopathy. Lungs/Pleura: Assessment of fine architectural detail the lungs is obscured by breathing motion. Scattered areas of architectural distortion and ground-glass opacity are noted bilaterally. Confluent collapse/consolidation is seen in the lingula and left lower lobe. Moderate to large left pleural effusion is associated with a small right pleural effusion. Upper Abdomen: Punctate nonobstructing stones are seen in both kidneys. There is possible right hydronephrosis. Prominent left Peri renal edema evident. Mesenteric/omental edema cannot be excluded. Musculoskeletal: No worrisome lytic or sclerotic osseous abnormality. IMPRESSION: 1. Confluent collapse/consolidation in the lingula and left lower lobe with moderate to large left and small right pleural effusions. 2. Assessment of fine architectural detail in the lungs is obscured by breathing motion. Scattered areas of architectural distortion and ground-glass opacity bilaterally. Imaging features likely related to an infectious/inflammatory etiology. 3. Possible right hydronephrosis with prominent left perinephric edema. CT of the abdomen and pelvis with contrast, if possible, recommended to further evaluate.  4. Bilateral nephrolithiasis. 5. Aortic Atherosclerosis (ICD10-I70.0). Electronically Signed   By: Kennith Center M.D.   On: 10/20/2020 12:02   DG Chest Portable 1 View  Result Date: 10/20/2020 CLINICAL DATA:  Hypoxia EXAM: PORTABLE CHEST 1 VIEW COMPARISON:  None. FINDINGS: Cardiac shadow is mildly prominent. Bilateral pleural effusions are noted left significantly greater than right with some changes of vascular congestion identified as well. No bony abnormality is noted. IMPRESSION: Large left-sided pleural effusion with likely underlying atelectasis/infiltrate. Small right-sided pleural effusion is noted as well. Electronically Signed   By: Alcide Clever M.D.   On: 10/20/2020 09:26     Assessment/Plan Principal Problem:   AKI (acute kidney injury) (HCC) Active Problems:   Hyponatremia   CAP (community acquired pneumonia)   Parapneumonic effusion   Parkinson's disease (HCC)   GERD (gastroesophageal reflux disease)    Acute  kidney injury At baseline patient has a serum creatinine of 1.1 but today on admission his creatinine is 8.7, BUN is elevated at 118 Most likely postobstructive as patient had a markedly distended bladder during the night exam Foley catheter was inserted and drained 1.8 L Patient noted to have possible right hydronephrosis with prominent left perinephric edema. CT of the abdomen and pelvis with contrast, if possible, recommended to further evaluate. We will leave Foley catheter in place We will request urology and nephrology consult We will hydrate patient and repeat renal parameters in am    Community-acquired pneumonia with a parapneumonic effusion Imaging shows bilateral pleural effusions, Left > Right with collapse/consolidation involving the left lower lobe Patient has a productive cough but is afebrile and has leukocytosis with a left shift We will place patient empirically on Rocephin and Zithromax Interventional radiology for thoracentesis and will send  pleural fluid for analysis    Hyponatremia Most likely secondary to poor oral intake We will hydrate patient and repeat sodium levels in a.m. is rest   Generalized weakness Patient will need physical therapy evaluation once his acute illness has improved  DVT prophylaxis: Heparin Code Status: DNR Family Communication: Greater than 50% of time was spent discussing patient's condition and plan of care with him and his wife at the bedside. All questions and concerns have been addressed. They verbalized understanding and agree with the plan. CODE STATUS was discussed and he wishes to be placed in a DO NOT RESUSCITATE status. Disposition Plan: Back to previous home environment Consults called: Urology/Nephrology Status: Inpatient status. The medical decision making for this patient was of high complexity and patient is a high risk for clinical deterioration during his hospitalization.    Lucile Shutters MD [facility]     10/20/2020, 12:56 PM

## 2020-10-20 NOTE — Procedures (Signed)
PROCEDURE SUMMARY:  Successful US guided left thoracentesis. Yielded 1650 ml of yellow fluid. Pt tolerated procedure well. No immediate complications.  Specimen sent for labs. CXR ordered; no post-procedure pneumothorax identified  EBL < 1 mL  Mickie Kay, NP 10/20/2020 2:45 PM

## 2020-10-20 NOTE — ED Notes (Signed)
Admit MD at bedside

## 2020-10-20 NOTE — Consult Note (Signed)
Urology Consult  Reason for consultation: Urinary retention  Joshua George is a 80 y.o. male with a history of Parkinson's disease and dementia presenting to the ED with weakness, confusion and agitation.  Diagnosed with a UTI last week and has been on antibiotic.  I spoke with his wife who states he wears diapers but she has not noted any prior voiding difficulty.  Past urologic history remarkable for a passed stone 3 decades ago.  Creatinine noted to be 8.87 and noted to have bladder distention.  Foley catheter placed with return of 1800 mL of clear urine.  Renal ultrasound was performed which showed no hydronephrosis.  Impression: Urinary retention  Recommendation:  Indwelling Foley for 10-14 days  Outpatient follow-up for catheter removal and close monitoring of his residuals  Based on his large urine volume his retention may be chronic   Irineo Axon, MD

## 2020-10-20 NOTE — ED Provider Notes (Signed)
[facility] Emergency Department Provider Note ____________________________________________   Event Date/Time   First MD Initiated Contact with Patient 10/20/20 [phone]     (approximate)  I have reviewed the triage vital signs and the nursing notes.   HISTORY  Chief Complaint Weakness  Level 5 caveat: History of present illness limited due to dementia  HPI Joshua George is a 80 y.o. male with PMH as noted below including Parkinson's disease and dementia who presents with increased weakness, confusion, and agitation over the last several days.  Per EMS, the patient had been off of some of his Parkinson's medications for about a week although was restarted on them several days ago.  He was also diagnosed with a UTI last week and is being treated with an antibiotic.  The patient states he feels somewhat weak but otherwise denies complaints.  Past Medical History:  Diagnosis Date  . BPH (benign prostatic hyperplasia)   . Dementia (HCC)   . GERD (gastroesophageal reflux disease)   . Parkinson's disease (HCC)     There are no problems to display for this patient.   Past Surgical History:  Procedure Laterality Date  . TONSILLECTOMY      Prior to Admission medications   Not on File    Allergies Patient has no known allergies.  History reviewed. No pertinent family history.  Social History Social History   Tobacco Use  . Smoking status: Never Smoker  . Smokeless tobacco: Never Used  Substance Use Topics  . Alcohol use: Not Currently    Review of Systems Level 5 caveat: Review of systems limited due to dementia Constitutional: No fever.  Positive for weakness. Cardiovascular: Denies chest pain. Respiratory: Denies shortness of breath. Gastrointestinal: No vomiting. Skin: Negative for rash. Neurological: Negative for headache.   ____________________________________________   PHYSICAL EXAM:  VITAL SIGNS: ED Triage Vitals [10/20/20 0849]   Enc Vitals Group     BP (!) 167/90     Pulse Rate 83     Resp 20     Temp (!) 97.4 F (36.3 C)     Temp Source Oral     SpO2 91 %     Weight 145 lb (65.8 kg)     Height 5\' 8"  (1.727 m)     Head Circumference      Peak Flow      Pain Score 8     Pain Loc      Pain Edu?      Excl. in GC?     Constitutional: Alert and oriented.  Somewhat weak appearing but in no acute distress. Eyes: Conjunctivae are normal.  Head: Atraumatic. Nose: No congestion/rhinnorhea. Mouth/Throat: Mucous membranes are dry. Neck: Normal range of motion.  Cardiovascular: Normal rate, regular rhythm. Grossly normal heart sounds.  Good peripheral circulation. Respiratory: Somewhat increased respiratory effort.  No retractions. Lungs CTAB. Gastrointestinal: Soft and nontender. No distention.  Genitourinary: No flank tenderness. Musculoskeletal: No lower extremity edema.  Extremities warm and well perfused.  Neurologic: Dysarthric speech.  Motor intact in all extremities..  Skin:  Skin is warm and dry. No rash noted. Psychiatric: Calm and cooperative.  ____________________________________________   LABS (all labs ordered are listed, but only abnormal results are displayed)  Labs Reviewed  COMPREHENSIVE METABOLIC PANEL - Abnormal; Notable for the following components:      Result Value   Sodium 129 (*)    Chloride 91 (*)    BUN 118 (*)    Creatinine, Ser 8.87 (*)  Calcium 8.8 (*)    Albumin 3.3 (*)    AST 14 (*)    Total Bilirubin 1.4 (*)    GFR, Estimated 6 (*)    Anion gap 16 (*)    All other components within normal limits  CBC WITH DIFFERENTIAL/PLATELET - Abnormal; Notable for the following components:   WBC 13.7 (*)    Neutro Abs 11.5 (*)    Lymphs Abs 0.4 (*)    Monocytes Absolute 1.6 (*)    Abs Immature Granulocytes 0.08 (*)    All other components within normal limits  RESP PANEL BY RT-PCR (FLU A&B, COVID) ARPGX2  URINE CULTURE  LACTIC ACID, PLASMA  LACTIC ACID, PLASMA   URINALYSIS, COMPLETE (UACMP) WITH MICROSCOPIC  TROPONIN I (HIGH SENSITIVITY)  TROPONIN I (HIGH SENSITIVITY)   ____________________________________________  EKG  ED ECG REPORT I, Dionne Bucy, the attending physician, personally viewed and interpreted this ECG.  Date: 10/20/2020 EKG Time: 0846 Rate: 82 Rhythm: normal sinus rhythm QRS Axis: normal Intervals: normal ST/T Wave abnormalities: Nonspecific borderline T wave abnormalities Narrative Interpretation: Nonspecific abnormalities with no evidence of acute ischemia  ____________________________________________  RADIOLOGY  Chest x-ray interpreted by me shows large left and small right pleural effusions  ____________________________________________   PROCEDURES  Procedure(s) performed: No  Procedures  Critical Care performed: No ____________________________________________   INITIAL IMPRESSION / ASSESSMENT AND PLAN / ED COURSE  Pertinent labs & imaging results that were available during my care of the patient were reviewed by me and considered in my medical decision making (see chart for details).  80 year old male with a history of Parkinson's disease and dementia presents with increased weakness, altered mental status, and agitation over the last several days.  I reviewed the past medical records in Epic.  The patient has no recent prior ED visits or admissions here.  He was last seen by his neurologist Dr. Sherryll Burger on 2/3.  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  He was diagnosed with a possible UTI and prescribed nitrofurantoin.  Urinalysis from 2/3 shows 0-3 WBCs with some RBCs but no nitrite or leukocyte Estrace.  On exam currently the patient is overall somewhat weak appearing with increased work of breathing.  His vital signs are normal except for O2 saturation of 90 to 91% on room air and mild hypertension.  His mucous membranes are  very dry.  Exam is otherwise as described above.  Differential includes UTI, pneumonia, COVID-19, other infection, dehydration, other metabolic disturbance, benzodiazepine withdrawal.  We will obtain a chest x-ray, Covid swab, lab work-up, give fluids, and reassess.  ----------------------------------------- 11:02 AM on 10/20/2020 -----------------------------------------  Chest x-ray reveals bilateral pleural effusions and possible infiltrate on the left.  Lab work-up reveals acute renal failure and uremia which likely explains the patient's mental status.  His WBC count is also elevated.  He is Covid negative and the lactate is normal.  He remains hemodynamically stable.  Empiric antibiotics have been ordered for possible community-acquired pneumonia.  I discussed the case with Dr. Cherylann Ratel from nephrology who recommended renal ultrasound to initiate the work-up.  I then discussed the case with Dr. Joylene Igo from the hospitalist service for admission.  ___________________________  Darlyn Read was evaluated in Emergency Department on 10/20/2020 for the symptoms described in the history of present illness. He was evaluated in the context of the global COVID-19 pandemic, which necessitated consideration that the patient might be at risk  for infection with the SARS-CoV-2 virus that causes COVID-19. Institutional protocols and algorithms that pertain to the evaluation of patients at risk for COVID-19 are in a state of rapid change based on information released by regulatory bodies including the CDC and federal and state organizations. These policies and algorithms were followed during the patient's care in the ED.  ____________________________________________   FINAL CLINICAL IMPRESSION(S) / ED DIAGNOSES  Final diagnoses:  Acute kidney injury (HCC)  Altered mental status, unspecified altered mental status type  Pleural effusion  Community acquired pneumonia, unspecified laterality      NEW  MEDICATIONS STARTED DURING THIS VISIT:  New Prescriptions   No medications on file     Note:  This document was prepared using Dragon voice recognition software and may include unintentional dictation errors.    Dionne Bucy, MD 10/20/20 [phone]

## 2020-10-21 LAB — PH, BODY FLUID: pH, Body Fluid: 7.4

## 2020-10-21 LAB — BASIC METABOLIC PANEL
Anion gap: 13 (ref 5–15)
BUN: 38 mg/dL — ABNORMAL HIGH (ref 8–23)
CO2: 21 mmol/L — ABNORMAL LOW (ref 22–32)
Calcium: 8.1 mg/dL — ABNORMAL LOW (ref 8.9–10.3)
Chloride: 111 mmol/L (ref 98–111)
Creatinine, Ser: 1.8 mg/dL — ABNORMAL HIGH (ref 0.61–1.24)
GFR, Estimated: 38 mL/min — ABNORMAL LOW (ref >60)
Glucose, Bld: 87 mg/dL (ref 70–99)
Potassium: 3.8 mmol/L (ref 3.5–5.1)
Sodium: 145 mmol/L (ref 135–145)

## 2020-10-21 LAB — CBC
HCT: 38.5 % — ABNORMAL LOW (ref 39.0–52.0)
Hemoglobin: 13.4 g/dL (ref 13.0–17.0)
MCH: 30.9 pg (ref 26.0–34.0)
MCHC: 34.8 g/dL (ref 30.0–36.0)
MCV: 88.9 fL (ref 80.0–100.0)
Platelets: 182 10*3/uL (ref 150–400)
RBC: 4.33 MIL/uL (ref 4.22–5.81)
RDW: 12.8 % (ref 11.5–15.5)
WBC: 12.3 10*3/uL — ABNORMAL HIGH (ref 4.0–10.5)
nRBC: 0 % (ref 0.0–0.2)

## 2020-10-21 LAB — URINE CULTURE: Culture: NO GROWTH

## 2020-10-21 LAB — PROTEIN, BODY FLUID (OTHER): Total Protein, Body Fluid Other: 0.5 g/dL

## 2020-10-21 LAB — HIV ANTIBODY (ROUTINE TESTING W REFLEX): HIV Screen 4th Generation wRfx: NONREACTIVE

## 2020-10-21 MED ORDER — AMANTADINE HCL 100 MG PO CAPS
100.0000 mg | ORAL_CAPSULE | Freq: Two times a day (BID) | ORAL | Status: DC
  Administered 2020-10-22 – 2020-10-28 (×14): 100 mg via ORAL
  Filled 2020-10-21 (×16): qty 1

## 2020-10-21 MED ORDER — RASAGILINE MESYLATE 1 MG PO TABS
1.0000 mg | ORAL_TABLET | Freq: Every day | ORAL | Status: DC
  Administered 2020-10-22 – 2020-10-28 (×7): 1 mg via ORAL
  Filled 2020-10-21 (×8): qty 1

## 2020-10-21 MED ORDER — CLONAZEPAM 1 MG PO TABS
1.0000 mg | ORAL_TABLET | Freq: Every day | ORAL | Status: DC
  Administered 2020-10-22 – 2020-10-27 (×7): 1 mg via ORAL
  Filled 2020-10-21 (×7): qty 1

## 2020-10-21 MED ORDER — CARBIDOPA-LEVODOPA 25-100 MG PO TABS
1.5000 | ORAL_TABLET | Freq: Four times a day (QID) | ORAL | Status: DC
  Administered 2020-10-22 – 2020-10-28 (×26): 1.5 via ORAL
  Filled 2020-10-21 (×31): qty 1.5

## 2020-10-21 MED ORDER — ENTACAPONE 200 MG PO TABS
200.0000 mg | ORAL_TABLET | Freq: Four times a day (QID) | ORAL | Status: DC
  Administered 2020-10-22 – 2020-10-28 (×26): 200 mg via ORAL
  Filled 2020-10-21 (×32): qty 1

## 2020-10-21 NOTE — Progress Notes (Signed)
Patient's BP elevated 155/83. Temp 100.3 Patient's wife reports patient is not prescribed any home BP medications. MD notified via secure chat of patient vitals. Patient fluids running at 138ml/hr . see MAR. Patient given water per wife's request. Patient denies further needs at this time.

## 2020-10-21 NOTE — Progress Notes (Signed)
PROGRESS NOTE    Joshua George  FBP:[id number] DOB: [birth year] DOA: 10/20/2020 PCP: Kandyce Rud, MD   Brief Narrative: Taken from H&P. Joshua George is a 80 y.o. male with medical history significant for Parkinson's disease, GERD, BPH and dementia who presents to the ER by EMS for evaluation of progressively worsening generalized weakness requiring a 1 person assist which is unusual for the patient.  Poor p.o. intake and worsening generalized weakness over the past 1 to 2 weeks. Recently treated for a presumed UTI. Patient was found to have AKI and urinary retention.  Foley with more than 2 L of urine, AKI started improving with Foley.  Patient has slurred speech and dysarthria since his surgery on lower jaw when part of his jaw and teeth were removed for some growth per wife. Imaging with right-sided pleural effusion s/p thoracentesis and initial labs consistent with transudate. CT abdomen with bilateral nephrolithiasis and right hydronephrosis with prominent left perinephric edema. Urology was also consulted and they are recommending continue with Foley and outpatient follow-up in 2 weeks.  Subjective: Patient was resting comfortably, was trying to speak with wife which was not understandable, currently trying to reach some objects which are not there so wife was concern about continuation of hallucinations.  Assessment & Plan:   Principal Problem:   AKI (acute kidney injury) (HCC) Active Problems:   Hyponatremia   CAP (community acquired pneumonia)   Parapneumonic effusion   Parkinson's disease (HCC)   GERD (gastroesophageal reflux disease)  AKI.  Significant improvement in creatinine after Foley placement.  Most likely secondary to urinary obstruction.  Creatinine at 1.8 today from 8.87 yesterday.  BUN improved to 38 from 118.  Baseline around 1 -Continue to monitor -Avoid nephrotoxins -Patient will be discharged with Foley catheter and will follow up with urology as an  outpatient.  Pleural effusion.  Patient with bilateral pleural effusions, left more than right with some concern of left lower lobe collapse.  Patient with leukocytosis with left shift.  No prior history of heart failure. S/p thoracentesis with removal of about 165 0 mL of fluid, initial labs appears transudate, cultures pending. -Continue ceftriaxone and Zithromax for now -Echocardiogram to rule out heart failure.  Hyponatremia.  Resolved  Parkinsonism with worsening generalized weakness.  May be disease progression.  PT is recommending SNF placement. -TOC consult for placement. -Continue home meds  Objective: Vitals:   10/21/20 0323 10/21/20 0734 10/21/20 1210 10/21/20 1409  BP: (!) 174/92 (!) 172/95 (!) 162/85 (!) 155/83  Pulse: 85 81 89 86  Resp: 17 20 (!) 21   Temp: 99.7 F (37.6 C) 98.7 F (37.1 C)  100.3 F (37.9 C)  TempSrc: Oral   Oral  SpO2: 96% 92% 91%   Weight: 59.5 kg     Height:        Intake/Output Summary (Last 24 hours) at 10/21/2020 1555 Last data filed at 10/21/2020 1400 Gross per 24 hour  Intake 1205.35 ml  Output 4700 ml  Net -3494.65 ml   Filed Weights   10/20/20 0849 10/20/20 1618 10/21/20 0323  Weight: 65.8 kg 62.1 kg 59.5 kg    Examination:  General exam: Appears calm and comfortable  Respiratory system: Clear to auscultation. Respiratory effort normal. Cardiovascular system: S1 & S2 heard, RRR. Gastrointestinal system: Soft, nontender, nondistended, bowel sounds positive. Central nervous system: Awake but unable to answer orientation questions. Extremities: No edema, no cyanosis, pulses intact and symmetrical. Psychiatry: Judgement and insight appear impaired   DVT prophylaxis: Heparin  Code Status: DNR Family Communication: Wife was updated at bedside Disposition Plan:  Status is: Inpatient  Remains inpatient appropriate because:Inpatient level of care appropriate due to severity of illness   Dispo: The patient is from: Home               Anticipated d/c is to: SNF              Anticipated d/c date is: 2 days              Patient currently is not medically stable to d/c.   Difficult to place patient Yes              Level of care: Progressive Cardiac  Consultants:   Urology  Nephrology  Procedures:  Antimicrobials:   Data Reviewed: I have personally reviewed following labs and imaging studies  CBC: Recent Labs  Lab 10/20/20 0848 10/21/20 0502  WBC 13.7* 12.3*  NEUTROABS 11.5*  --   HGB 14.0 13.4  HCT 40.8 38.5*  MCV 87.7 88.9  PLT 186 182   Basic Metabolic Panel: Recent Labs  Lab 10/20/20 0848 10/21/20 0502  NA 129* 145  K 5.1 3.8  CL 91* 111  CO2 22 21*  GLUCOSE 92 87  BUN 118* 38*  CREATININE 8.87* 1.80*  CALCIUM 8.8* 8.1*   GFR: Estimated Creatinine Clearance: 28.5 mL/min (A) (by C-G formula based on SCr of 1.8 mg/dL (H)). Liver Function Tests: Recent Labs  Lab 10/20/20 0848  AST 14*  ALT <5  ALKPHOS 91  BILITOT 1.4*  PROT 7.1  ALBUMIN 3.3*   No results for input(s): LIPASE, AMYLASE in the last 168 hours. No results for input(s): AMMONIA in the last 168 hours. Coagulation Profile: No results for input(s): INR, PROTIME in the last 168 hours. Cardiac Enzymes: No results for input(s): CKTOTAL, CKMB, CKMBINDEX, TROPONINI in the last 168 hours. BNP (last 3 results) No results for input(s): PROBNP in the last 8760 hours. HbA1C: No results for input(s): HGBA1C in the last 72 hours. CBG: No results for input(s): GLUCAP in the last 168 hours. Lipid Profile: No results for input(s): CHOL, HDL, LDLCALC, TRIG, CHOLHDL, LDLDIRECT in the last 72 hours. Thyroid Function Tests: No results for input(s): TSH, T4TOTAL, FREET4, T3FREE, THYROIDAB in the last 72 hours. Anemia Panel: No results for input(s): VITAMINB12, FOLATE, FERRITIN, TIBC, IRON, RETICCTPCT in the last 72 hours. Sepsis Labs: Recent Labs  Lab 10/20/20 0848 10/20/20 1038  LATICACIDVEN 1.3 1.3    Recent Results (from  the past 240 hour(s))  Resp Panel by RT-PCR (Flu A&B, Covid) Nasopharyngeal Swab     Status: None   Collection Time: 10/20/20  8:49 AM   Specimen: Nasopharyngeal Swab; Nasopharyngeal(NP) swabs in vial transport medium  Result Value Ref Range Status   SARS Coronavirus 2 by RT PCR NEGATIVE NEGATIVE Final    Comment: (NOTE) SARS-CoV-2 target nucleic acids are NOT DETECTED.  The SARS-CoV-2 RNA is generally detectable in upper respiratory specimens during the acute phase of infection. The lowest concentration of SARS-CoV-2 viral copies this assay can detect is 138 copies/mL. A negative result does not preclude SARS-Cov-2 infection and should not be used as the sole basis for treatment or other patient management decisions. A negative result may occur with  improper specimen collection/handling, submission of specimen other than nasopharyngeal swab, presence of viral mutation(s) within the areas targeted by this assay, and inadequate number of viral copies(<138 copies/mL). A negative result must be combined with clinical observations, patient  history, and epidemiological information. The expected result is Negative.  Fact Sheet for Patients:  BloggerCourse.com  Fact Sheet for Healthcare Providers:  SeriousBroker.it  This test is no t yet approved or cleared by the [place] FDA and  has been authorized for detection and/or diagnosis of SARS-CoV-2 by FDA under an Emergency Use Authorization (EUA). This EUA will remain  in effect (meaning this test can be used) for the duration of the COVID-19 declaration under Section 564(b)(1) of the Act, 21 U.S.C.section 360bbb-3(b)(1), unless the authorization is terminated  or revoked sooner.       Influenza A by PCR NEGATIVE NEGATIVE Final   Influenza B by PCR NEGATIVE NEGATIVE Final    Comment: (NOTE) The Xpert Xpress SARS-CoV-2/FLU/RSV plus assay is intended as an aid in the diagnosis of  influenza from Nasopharyngeal swab specimens and should not be used as a sole basis for treatment. Nasal washings and aspirates are unacceptable for Xpert Xpress SARS-CoV-2/FLU/RSV testing.  Fact Sheet for Patients: BloggerCourse.com  Fact Sheet for Healthcare Providers: SeriousBroker.it  This test is not yet approved or cleared by the [place] FDA and has been authorized for detection and/or diagnosis of SARS-CoV-2 by FDA under an Emergency Use Authorization (EUA). This EUA will remain in effect (meaning this test can be used) for the duration of the COVID-19 declaration under Section 564(b)(1) of the Act, 21 U.S.C. section 360bbb-3(b)(1), unless the authorization is terminated or revoked.  Performed at [facility], [address] [place], [place] [address]   Urine culture     Status: None   Collection Time: 10/20/20 11:35 AM   Specimen: Urine, Random  Result Value Ref Range Status   Specimen Description   Final    URINE, RANDOM Performed at [facility], [address], [place], [place] [address]    Special Requests   Final    NONE Performed at [facility], [address] [place], [place] [address]    Culture   Final    NO GROWTH Performed at [facility], [address], [place], [place] [address]    Report Status 10/21/2020 FINAL  Final  Body fluid culture     Status: None (Preliminary result)   Collection Time: 10/20/20  1:30 PM   Specimen: PATH Cytology Pleural fluid  Result Value Ref Range Status   Specimen Description   Final    PLEURAL Performed at [facility], [address], [place], [place] [address]    Special Requests   Final    NONE Performed at [facility], [address] [place], [place] [address]    Gram Stain   Final    MODERATE WBC PRESENT,BOTH PMN AND MONONUCLEAR NO ORGANISMS SEEN    Culture   Final    NO GROWTH < 24  HOURS Performed at [facility], [address] [place], [place] [address]    Report Status PENDING  Incomplete     Radiology Studies: CT CHEST WO CONTRAST  Result Date: 10/20/2020 CLINICAL DATA:  Weakness and confusion. EXAM: CT CHEST WITHOUT CONTRAST TECHNIQUE: Multidetector CT imaging of the chest was performed following the standard protocol without IV contrast. COMPARISON:  None. FINDINGS: Cardiovascular: Heart size upper normal. No substantial pericardial effusion. Atherosclerotic calcification is noted in the wall of the thoracic aorta. Mediastinum/Nodes: Assessment limited by motion and lack of intravenous contrast material. No mediastinal lymphadenopathy. Left hilum obscured by fluid and lung collapse. No definite right hilar lymphadenopathy on  noncontrast imaging. The esophagus has normal imaging features. There is no axillary lymphadenopathy. Lungs/Pleura: Assessment of fine architectural detail the lungs is obscured by breathing motion. Scattered areas of architectural distortion and ground-glass opacity are noted bilaterally. Confluent collapse/consolidation is seen in the lingula and left lower lobe. Moderate to large left pleural effusion is associated with a small right pleural effusion. Upper Abdomen: Punctate nonobstructing stones are seen in both kidneys. There is possible right hydronephrosis. Prominent left Peri renal edema evident. Mesenteric/omental edema cannot be excluded. Musculoskeletal: No worrisome lytic or sclerotic osseous abnormality. IMPRESSION: 1. Confluent collapse/consolidation in the lingula and left lower lobe with moderate to large left and small right pleural effusions. 2. Assessment of fine architectural detail in the lungs is obscured by breathing motion. Scattered areas of architectural distortion and ground-glass opacity bilaterally. Imaging features likely related to an infectious/inflammatory etiology. 3. Possible right hydronephrosis with prominent left  perinephric edema. CT of the abdomen and pelvis with contrast, if possible, recommended to further evaluate. 4. Bilateral nephrolithiasis. 5. Aortic Atherosclerosis (ICD10-I70.0). Electronically Signed   By: Kennith Center M.D.   On: 10/20/2020 12:02   US RENAL  Result Date: 10/20/2020 CLINICAL DATA:  Acute kidney injury EXAM: RENAL / URINARY TRACT ULTRASOUND COMPLETE COMPARISON:  None. FINDINGS: Right Kidney: Renal measurements: 12.8 by 6.6 by 5.4 cm = volume: 237 mL. Echogenic right kidney, substantially more echogenic than the adjacent liver. Right kidney lower pole 4.6 by 4.4 by 4.1 cm simple appearing cyst. The punctate calcifications seen in the upper pole of the right kidney on recent CT are not well seen by ultrasound, could represent tiny punctate occult calculi versus vascular calcifications. No hydronephrosis. Left Kidney: Renal measurements: 12.3 by 5.0 by 4.0 = volume: 130 mL. Echogenic kidney similar to the opposite side. No renal mass. There does appear to be some edema in the left perirenal space as shown on recent CT. As on the contralateral side, the small calcification shown in the left mid kidney on recent CT are not well characterized by ultrasound, and could represent tiny occult nonobstructive calculi or vascular calcifications. No hydronephrosis. Bladder: Empty urinary bladder surrounding the Foley catheter. Difficult to further evaluate. Other: None. IMPRESSION: 1. Echogenic kidneys. Chronic medical renal disease can cause echogenic kidneys but the kidneys are not significantly reduced in size in this patient, would be typical in the setting of chronic kidney disease. Echogenic kidneys can also be seen in the setting of nephrotic syndrome and sometimes in the setting of glomerulonephritis, as well as in conditions such as sickle cell disease. 2. No current hydronephrosis. 3. The tiny punctate calcifications shown on recent CT are occult on ultrasound and could be vascular or small occult  renal calculi. 4. Small amount of left perirenal edema as shown on recent CT. 5. Simple appearing right kidney lower pole cyst. Electronically Signed   By: Gaylyn Rong M.D.   On: 10/20/2020 14:42   DG Chest Port 1 View  Result Date: 10/20/2020 CLINICAL DATA:  Status post left-sided thoracentesis. EXAM: PORTABLE CHEST 1 VIEW COMPARISON:  Earlier film, same date. FINDINGS: Near complete evacuation of the left pleural effusion. Small residual left pleural effusion. No postprocedural pneumothorax. Streaky left basilar atelectasis. Stable underlying lung disease. IMPRESSION: Status post left thoracentesis with near complete evacuation of the left pleural effusion and no postprocedural pneumothorax. Electronically Signed   By: Rudie Meyer M.D.   On: 10/20/2020 14:20   DG Chest Portable 1 View  Result Date: 10/20/2020 CLINICAL DATA:  Hypoxia EXAM: PORTABLE CHEST 1 VIEW COMPARISON:  None. FINDINGS: Cardiac shadow is mildly prominent. Bilateral pleural effusions are noted left significantly greater than right with some changes of vascular congestion identified as well. No bony abnormality is noted. IMPRESSION: Large left-sided pleural effusion with likely underlying atelectasis/infiltrate. Small right-sided pleural effusion is noted as well. Electronically Signed   By: Alcide CleverMark  Lukens M.D.   On: 10/20/2020 09:26   US THORACENTESIS ASP PLEURAL SPACE W/IMG GUIDE  Result Date: 10/20/2020 INDICATION: Patient with community acquired pneumonia presents today for a therapeutic and diagnostic thoracentesis. EXAM: ULTRASOUND GUIDED THORACENTESIS MEDICATIONS: 1% lidocaine 10 mL COMPLICATIONS: None immediate. PROCEDURE: An ultrasound guided thoracentesis was thoroughly discussed with the patient and questions answered. The benefits, risks, alternatives and complications were also discussed. The patient understands and wishes to proceed with the procedure. Written consent was obtained. Ultrasound was performed to localize  and mark an adequate pocket of fluid in the left chest. The area was then prepped and draped in the normal sterile fashion. 1% Lidocaine was used for local anesthesia. Under ultrasound guidance a 6 Fr Safe-T-Centesis catheter was introduced. Thoracentesis was performed. The catheter was removed and a dressing applied. FINDINGS: A total of approximately 1650 ml of yellow fluid was removed. Samples were sent to the laboratory as requested by the clinical team. IMPRESSION: Successful ultrasound guided left thoracentesis yielding 1650 ml of pleural fluid. Electronically Signed   By: Irish LackGlenn  Yamagata M.D.   On: 10/20/2020 14:44    Scheduled Meds: . amantadine  100 mg Oral BID  . carbidopa-levodopa  1.5 tablet Oral QID  . Chlorhexidine Gluconate Cloth  6 each Topical Daily  . clonazePAM  1 mg Oral QHS  . entacapone  200 mg Oral QID  . heparin  5,000 Units Subcutaneous Q8H  . mouth rinse  15 mL Mouth Rinse BID  . rasagiline  1 mg Oral Daily  . sodium chloride flush  3 mL Intravenous Q12H   Continuous Infusions: . sodium chloride    . sodium chloride 125 mL/hr at 10/21/20 1051  . azithromycin 500 mg (10/21/20 1159)  . cefTRIAXone (ROCEPHIN)  IV 2 g (10/21/20 1056)     LOS: 1 day   Time spent: 40 minutes.  Arnetha CourserSumayya Wetona Viramontes, MD [facility]  If 7PM-7AM, please contact night-coverage [email or web address]  10/21/2020, 3:55 PM   This record has been created using Conservation officer, historic buildingsDragon voice recognition software. Errors have been sought and corrected,but may not always be located. Such creation errors do not reflect on the standard of care.

## 2020-10-21 NOTE — Evaluation (Signed)
Physical Therapy Evaluation Patient Details Name: Joshua George MRN: [id number] DOB: 13-Jun-1941 Today's Date: 10/21/2020   History of Present Illness  Pt is a 80yo M admitted to [facility] on 10/20/20 for c/o increased weakness, confusion, agitation, and slurred speech. Spouse notes decreased oral intake and generalized weakness. Significant PMH includes: PD and dementia. Pt notes that he  has been on/off of his PD medications for about a week, but resumed medication a few days ago. Pt dx with UTI last week and is currently being treated with antibiotic. Pt s/p L thoracentesis yielding of yellow fluid. Imaging revealed large L sided pleural effusion with underlying atelectasis/infiltrate and small R sided pleural effusion.    Clinical Impression  Pt is a 80 year old M admitted to hospital on 10/20/20 for AKI. At baseline, pt is Ind with all ADL's, IADL's, and community ambulation, however, spouse notes that pt has required increased assistance for all mobility, IADL's, and ADL's since onset of symptoms over the past 3 weeks. Pt presents with AMS, requiring increased multimodal cueing throughout session for safety, however, improved mentation noted with mobility. Pt presents with AMS, functional weakness, decreased gross balance, poor safety awareness, decreased activity tolerance, and impaired motor control/grading/planning, resulting in impaired functional mobility from baseline. Due to deficits, pt required mod-max assist for bed mobility, mod assist for transfers, and mod assist to take 4-5 lateral steps at bedside with RW. Further mobility limited secondary to poor safety awareness, decreased activity tolerance, and AMS. Deficits limit the pt's ability to safely and independently perform ADL's, transfer, and ambulate. Pt will benefit from acute skilled PT services to address deficits for return to baseline function. At this time, PT recommends DC to SNF to address deficits prior to return home; spouse  agreeable.     Follow Up Recommendations SNF;Supervision - Intermittent;Supervision for mobility/OOB    Equipment Recommendations  Other (comment) (Defer to post acute)    Recommendations for Other Services OT consult     Precautions / Restrictions Precautions Precautions: Fall Restrictions Weight Bearing Restrictions: No      Mobility  Bed Mobility Overal bed mobility: Needs Assistance Bed Mobility: Supine to Sit;Sit to Supine     Supine to sit: Mod assist;HOB elevated Sit to supine: Max assist   General bed mobility comments: Mod-max assist for trunk/BLE facilitation for supine<>sit transfer. Multimodal cues for safety and sequencing.    Transfers Overall transfer level: Needs assistance Equipment used: Rolling walker (2 wheeled) Transfers: Sit to/from Stand Sit to Stand: From elevated surface;Mod assist         General transfer comment: Mod assist for power to stand and controlled descent to sit EOB with RW. Multimodal cues for sequencing, safety, and hand placement.  Ambulation/Gait Ambulation/Gait assistance: Mod assist Gait Distance (Feet): 2 Feet (4-5 lateral steps at EOB) Assistive device: Rolling walker (2 wheeled)   Gait velocity: decreased   General Gait Details: Mod assist for balance/RW negotiation to take 4-5 lateral steps at EOB with RW. Pt demonstrates impaired motor grading/control/coordination, narrow BOS, kyphotic posture with downward gaze, and poor safety awareness. Increased multimodal cues for sequencing, RW negotiation, and safety. RPE of 4-6/10 indicating "moderate activity"     Balance Overall balance assessment: Needs assistance Sitting-balance support: Bilateral upper extremity supported;Feet unsupported Sitting balance-Leahy Scale: Fair Sitting balance - Comments: Fair seated balance with BUE support; c/o mild dizziness   Standing balance support: Bilateral upper extremity supported;During functional activity Standing  balance-Leahy Scale: Poor Standing balance comment: Poor standing balance in  RW, requiring increased assistance; c/o mild dizziness                             Pertinent Vitals/Pain Pain Assessment: No/denies pain    Home Living Family/patient expects to be discharged to:: Private residence Living Arrangements: Spouse/significant other Available Help at Discharge: Family;Available PRN/intermittently (Spouse able to provide 24/7 supervision; other family able to provide PRN assistance/supervision) Type of Home: House Home Access: Ramped entrance     Home Layout: One level Home Equipment: Cane - single point Additional Comments: Hurrycane    Prior Function Level of Independence: Independent         Comments: Pt with progressive onset of symptoms over past 3 weeks. Prior to symptoms, spouse reports that pt was Ind with community ambulation, ADL's, and IADL's. Over past 3 weeks pt has become progressively weaker, requiring assistance for short distance ambulation with SPC, transfers, ADL's, and IADL's. Pt does not drive.     Hand Dominance        Extremity/Trunk Assessment   Upper Extremity Assessment Upper Extremity Assessment: Overall WFL for tasks assessed (Grossly 4/5)    Lower Extremity Assessment Lower Extremity Assessment: Overall WFL for tasks assessed (Grossly 4/5)    Cervical / Trunk Assessment Cervical / Trunk Assessment: Kyphotic  Communication   Communication: Expressive difficulties  Cognition Arousal/Alertness: Lethargic Behavior During Therapy: WFL for tasks assessed/performed Overall Cognitive Status: Impaired/Different from baseline Area of Impairment: Orientation;Attention;Memory;Following commands;Safety/judgement;Awareness;Problem solving                 Orientation Level: Disoriented to;Place;Time;Situation Current Attention Level: Selective;Alternating Memory: Decreased short-term memory Following Commands: Follows one step  commands inconsistently;Follows one step commands with increased time Safety/Judgement: Decreased awareness of safety Awareness: Intellectual Problem Solving: Slow processing;Decreased initiation;Difficulty sequencing;Requires verbal cues;Requires tactile cues General Comments: Pt required increased cues and time for task; improved mentation noted with mobility.      General Comments General comments (skin integrity, edema, etc.): Pt feels warm to touch and has c/o feeling hot; RN notified. Pt with bil mittens donned upon PT entry.    Exercises Other Exercises Other Exercises: Pt able to participate in bed mobility, transfers, and short distance ambulation with RW. Increased assistance and cueing required during session due to AMS and weakness. Other Exercises: Pt and spouse educated regarding: PT role/POC, DC recommendations, pt deficits, safety with mobility, benefits of sitting upright in bed/chair. They verbalized understanding   Assessment/Plan    PT Assessment Patient needs continued PT services  PT Problem List Decreased mobility;Decreased safety awareness;Decreased coordination;Decreased activity tolerance;Decreased cognition;Decreased balance;Decreased knowledge of use of DME       PT Treatment Interventions Gait training;Functional mobility training;Therapeutic activities;Therapeutic exercise;Balance training;Neuromuscular re-education;Cognitive remediation;Patient/family education    PT Goals (Current goals can be found in the Care Plan section)  Acute Rehab PT Goals Patient Stated Goal: to get better PT Goal Formulation: With family Time For Goal Achievement: 11/04/20 Potential to Achieve Goals: Fair    Frequency Min 2X/week   Barriers to discharge Decreased caregiver support Spouse only able to provide 24/7 supervision       AM-PAC PT "6 Clicks" Mobility  Outcome Measure Help needed turning from your back to your side while in a flat bed without using bedrails?: A  Little Help needed moving from lying on your back to sitting on the side of a flat bed without using bedrails?: A Lot Help needed moving to and from a bed  to a chair (including a wheelchair)?: A Lot Help needed standing up from a chair using your arms (e.g., wheelchair or bedside chair)?: A Lot Help needed to walk in hospital room?: A Lot Help needed climbing 3-5 steps with a railing? : Total 6 Click Score: 12    End of Session Equipment Utilized During Treatment: Gait belt Activity Tolerance: Patient limited by fatigue Patient left: in bed;with call bell/phone within reach;with bed alarm set;with family/visitor present;with restraints reapplied (bil mittens donned) Nurse Communication: Mobility status PT Visit Diagnosis: Unsteadiness on feet (R26.81);Difficulty in walking, not elsewhere classified (R26.2);Other symptoms and signs involving the nervous system (R29.898)    Time: 1328-1401 PT Time Calculation (min) (ACUTE ONLY): 33 min   Charges:   PT Evaluation $PT Eval Moderate Complexity: 1 Mod PT Treatments $Therapeutic Activity: 8-22 mins       Vira Blanco, PT, DPT 2:41 PM,10/21/20

## 2020-10-22 ENCOUNTER — Inpatient Hospital Stay: Payer: Medicare PPO

## 2020-10-22 ENCOUNTER — Inpatient Hospital Stay (HOSPITAL_COMMUNITY)
Admit: 2020-10-22 | Discharge: 2020-10-22 | Disposition: A | Discharge status: Home / Self Care | Payer: Medicare PPO | Attending: Internal Medicine | Admitting: Internal Medicine

## 2020-10-22 DIAGNOSIS — I340 Nonrheumatic mitral (valve) insufficiency: Secondary | ICD-10-CM | POA: Diagnosis not present

## 2020-10-22 DIAGNOSIS — I361 Nonrheumatic tricuspid (valve) insufficiency: Secondary | ICD-10-CM | POA: Diagnosis not present

## 2020-10-22 DIAGNOSIS — I509 Heart failure, unspecified: Secondary | ICD-10-CM | POA: Diagnosis not present

## 2020-10-22 DIAGNOSIS — I351 Nonrheumatic aortic (valve) insufficiency: Secondary | ICD-10-CM | POA: Diagnosis not present

## 2020-10-22 LAB — ECHOCARDIOGRAM COMPLETE
AR max vel: 2.69 cm2
AV Area VTI: 2.95 cm2
AV Area mean vel: 2.87 cm2
AV Mean grad: 5 mmHg
AV Peak grad: 9.7 mmHg
Ao pk vel: 1.56 m/s
Area-P 1/2: 5.54 cm2
Height: 68 in
MV VTI: 5.1 cm2
P 1/2 time: 310 msec
S' Lateral: 3 cm
Weight: 2116.42 oz

## 2020-10-22 LAB — BASIC METABOLIC PANEL
Anion gap: 10 (ref 5–15)
BUN: 19 mg/dL (ref 8–23)
CO2: 24 mmol/L (ref 22–32)
Calcium: 7.7 mg/dL — ABNORMAL LOW (ref 8.9–10.3)
Chloride: 114 mmol/L — ABNORMAL HIGH (ref 98–111)
Creatinine, Ser: 0.89 mg/dL (ref 0.61–1.24)
GFR, Estimated: >60 mL/min (ref >60)
Glucose, Bld: 100 mg/dL — ABNORMAL HIGH (ref 70–99)
Potassium: 3.5 mmol/L (ref 3.5–5.1)
Sodium: 148 mmol/L — ABNORMAL HIGH (ref 135–145)

## 2020-10-22 LAB — CYTOLOGY - NON PAP

## 2020-10-22 LAB — CBC
HCT: 37.7 % — ABNORMAL LOW (ref 39.0–52.0)
Hemoglobin: 12.5 g/dL — ABNORMAL LOW (ref 13.0–17.0)
MCH: 30 pg (ref 26.0–34.0)
MCHC: 33.2 g/dL (ref 30.0–36.0)
MCV: 90.6 fL (ref 80.0–100.0)
Platelets: 201 10*3/uL (ref 150–400)
RBC: 4.16 MIL/uL — ABNORMAL LOW (ref 4.22–5.81)
RDW: 12.9 % (ref 11.5–15.5)
WBC: 12.6 10*3/uL — ABNORMAL HIGH (ref 4.0–10.5)
nRBC: 0 % (ref 0.0–0.2)

## 2020-10-22 LAB — PROCALCITONIN: Procalcitonin: 0.31 ng/mL

## 2020-10-22 MED ORDER — SODIUM CHLORIDE 0.9 % IV SOLN
500.0000 mg | INTRAVENOUS | Status: DC
  Administered 2020-10-23 – 2020-10-26 (×4): 500 mg via INTRAVENOUS
  Filled 2020-10-22 (×5): qty 500

## 2020-10-22 MED ORDER — SODIUM CHLORIDE 0.9 % IV SOLN
2.0000 g | INTRAVENOUS | Status: DC
  Administered 2020-10-23 – 2020-10-27 (×5): 2 g via INTRAVENOUS
  Filled 2020-10-22: qty 2
  Filled 2020-10-22: qty 20
  Filled 2020-10-22 (×3): qty 2

## 2020-10-22 MED ORDER — DEXTROSE 5 % IV SOLN: INTRAVENOUS | Status: AC

## 2020-10-22 MED ORDER — CEFUROXIME AXETIL 500 MG PO TABS
500.0000 mg | ORAL_TABLET | Freq: Two times a day (BID) | ORAL | Status: DC
  Filled 2020-10-22: qty 1

## 2020-10-22 MED ORDER — SODIUM CHLORIDE 0.9 % IV SOLN
500.0000 mg | INTRAVENOUS | Status: DC
  Filled 2020-10-22: qty 500

## 2020-10-22 MED ORDER — FUROSEMIDE 10 MG/ML IJ SOLN
20.0000 mg | Freq: Once | INTRAMUSCULAR | Status: AC
  Administered 2020-10-22: 20 mg via INTRAVENOUS
  Filled 2020-10-22: qty 2

## 2020-10-22 NOTE — Progress Notes (Signed)
Physical Therapy Treatment Patient Details Name: Joshua George MRN: [id number] DOB: Apr 14, 1941 Today's Date: 10/22/2020    History of Present Illness Pt is a 80yo M admitted to [facility] on 10/20/20 for c/o increased weakness, confusion, agitation, and slurred speech. Spouse notes decreased oral intake and generalized weakness. Significant PMH includes: PD and dementia. Pt notes that he  has been on/off of his PD medications for about a week, but resumed medication a few days ago. Pt dx with UTI last week and is currently being treated with antibiotic. Pt s/p L thoracentesis yielding of yellow fluid. Imaging revealed large L sided pleural effusion with underlying atelectasis/infiltrate and small R sided pleural effusion.    PT Comments    Patient received in bed sleeping. Wife at bedside. Patient having difficulty opening eyes. Will respond to questions, follow commands. Assisted patient in washing face to attempt to wake him up a bit. Patient is able to go from supine to sit with min assist. With cues can scoot to the edge of the bed. Due to continued lethargy sitting on edge of bed, patient assisted back to supine and immediately began snoring. Patient will continue to benefit from skilled PT while here to improve functional mobility and independence.       Follow Up Recommendations  SNF;Supervision - Intermittent;Supervision for mobility/OOB     Equipment Recommendations  Other (comment) (to be determined)    Recommendations for Other Services OT consult     Precautions / Restrictions Precautions Precautions: Fall Restrictions Weight Bearing Restrictions: No    Mobility  Bed Mobility Overal bed mobility: Needs Assistance Bed Mobility: Supine to Sit;Sit to Supine     Supine to sit: Min assist;HOB elevated Sit to supine: Mod assist   General bed mobility comments: patient able to get himself to edge of bed with increased time and cues. Mod assist to bring LEs back up onto bed.     Transfers                 General transfer comment: unable to attempted this date due to lethargy  Ambulation/Gait             General Gait Details: unable due to continued lethargy   Stairs             Wheelchair Mobility    Modified Rankin (Stroke Patients Only)       Balance Overall balance assessment: Needs assistance Sitting-balance support: Feet supported Sitting balance-Leahy Scale: Fair Sitting balance - Comments: Fair seated balance with BUE support                                    Cognition Arousal/Alertness: Lethargic Behavior During Therapy: Flat affect Overall Cognitive Status: Impaired/Different from baseline Area of Impairment: Awareness;Problem solving                   Current Attention Level: Selective   Following Commands: Follows one step commands inconsistently;Follows one step commands with increased time     Problem Solving: Slow processing;Decreased initiation;Difficulty sequencing;Requires verbal cues;Requires tactile cues General Comments: Pt required increased cues and time for task, remained very lethargic, keeping eyes closed most of session.      Exercises      General Comments        Pertinent Vitals/Pain Pain Assessment: No/denies pain    Home Living  Prior Function            PT Goals (current goals can now be found in the care plan section) Acute Rehab PT Goals Patient Stated Goal: to get better PT Goal Formulation: With family Time For Goal Achievement: 11/04/20 Potential to Achieve Goals: Fair Progress towards PT goals: Not progressing toward goals - comment (continues to be limited by lethargy)    Frequency    Min 2X/week      PT Plan Current plan remains appropriate    Co-evaluation              AM-PAC PT "6 Clicks" Mobility   Outcome Measure  Help needed turning from your back to your side while in a flat bed without  using bedrails?: A Little Help needed moving from lying on your back to sitting on the side of a flat bed without using bedrails?: A Little Help needed moving to and from a bed to a chair (including a wheelchair)?: Total Help needed standing up from a chair using your arms (e.g., wheelchair or bedside chair)?: Total Help needed to walk in hospital room?: Total Help needed climbing 3-5 steps with a railing? : Total 6 Click Score: 10    End of Session   Activity Tolerance: Patient limited by fatigue Patient left: in bed;with call bell/phone within reach;with bed alarm set;with family/visitor present;with restraints reapplied Nurse Communication: Mobility status PT Visit Diagnosis: Muscle weakness (generalized) (M62.81);Other abnormalities of gait and mobility (R26.89)     Time: 1310-1336 PT Time Calculation (min) (ACUTE ONLY): 26 min  Charges:  $Therapeutic Activity: 23-37 mins                      Vijay Durflinger, PT, GCS 10/22/20,1:49 PM

## 2020-10-22 NOTE — NC FL2 (Signed)
[place] MEDICAID FL2 LEVEL OF CARE SCREENING TOOL     IDENTIFICATION  Patient Name: Joshua George Birthdate: March 11, 1941 Sex: male Admission Date (Current Location): 10/20/2020  [facility] and [place] Number:  Chiropodist and Address:  [facility], [address], [place], [place] [address]      Provider Number: [id number]  Attending Physician Name and Address:  Arnetha Courser, MD  Relative Name and Phone Number:  RACER, QUAM (Spouse)   [phone] [facility] Phone)    Current Level of Care: Hospital Recommended Level of Care: Skilled Nursing Facility Prior Approval Number:    Date Approved/Denied:   PASRR Number: pending  Discharge Plan:      Current Diagnoses: Patient Active Problem List   Diagnosis Date Noted  . AKI (acute kidney injury) (HCC) 10/20/2020  . Hyponatremia 10/20/2020  . CAP (community acquired pneumonia) 10/20/2020  . Parapneumonic effusion 10/20/2020  . Parkinson's disease (HCC)   . GERD (gastroesophageal reflux disease)     Orientation RESPIRATION BLADDER Height & Weight     Place,Self  Normal External catheter Weight: 132 lb 4.4 oz (60 kg) Height:  5\' 8"  (172.7 cm)  BEHAVIORAL SYMPTOMS/MOOD NEUROLOGICAL BOWEL NUTRITION STATUS     (parkinsons) Incontinent Diet (2 gram sodium diet, thin liquds)  AMBULATORY STATUS COMMUNICATION OF NEEDS Skin   Limited Assist Verbally Bruising                       Personal Care Assistance Level of Assistance  Bathing,Feeding,Dressing Bathing Assistance: Maximum assistance Feeding assistance: Maximum assistance Dressing Assistance: Maximum assistance     Functional Limitations Info             SPECIAL CARE FACTORS FREQUENCY  PT (By licensed PT),OT (By licensed OT)     PT Frequency: 5 x/week OT Frequency: 5 x/week            Contractures      Additional Factors Info  Code Status,Allergies Code Status Info: DNR Allergies Info: nka           Current  Medications (10/22/2020):  This is the current hospital active medication list Current Facility-Administered Medications  Medication Dose Route Frequency Provider Last Rate Last Admin  . 0.9 %  sodium chloride infusion  250 mL Intravenous PRN Agbata, Tochukwu, MD      . acetaminophen (TYLENOL) tablet 650 mg  650 mg Oral Q6H PRN Agbata, Tochukwu, MD       Or  . acetaminophen (TYLENOL) suppository 650 mg  650 mg Rectal Q6H PRN Agbata, Tochukwu, MD      . amantadine (SYMMETREL) capsule 100 mg  100 mg Oral BID 12/20/2020, RPH   100 mg at 10/22/20 1025  . carbidopa-levodopa (SINEMET IR) 25-100 MG per tablet immediate release 1.5 tablet  1.5 tablet Oral QID 12/20/20, MD   1.5 tablet at 10/22/20 1308  . [START ON 10/23/2020] cefUROXime (CEFTIN) tablet 500 mg  500 mg Oral BID WC 12/21/2020, MD      . Chlorhexidine Gluconate Cloth 2 % PADS 6 each  6 each Topical Daily Agbata, Tochukwu, MD   6 each at 10/22/20 1122  . clonazePAM (KLONOPIN) tablet 1 mg  1 mg Oral QHS 12/20/20, MD   1 mg at 10/22/20 0011  . dextrose 5 % solution   Intravenous Continuous 12/20/20, MD      . entacapone (COMTAN) tablet 200 mg  200 mg Oral QID Arnetha Courser, MD  200 mg at 10/22/20 1308  . heparin injection 5,000 Units  5,000 Units Subcutaneous Q8H Agbata, Tochukwu, MD   5,000 Units at 10/22/20 1307  . MEDLINE mouth rinse  15 mL Mouth Rinse BID Agbata, Tochukwu, MD   15 mL at 10/22/20 1122  . ondansetron (ZOFRAN) tablet 4 mg  4 mg Oral Q6H PRN Agbata, Tochukwu, MD       Or  . ondansetron (ZOFRAN) injection 4 mg  4 mg Intravenous Q6H PRN Agbata, Tochukwu, MD      . rasagiline (AZILECT) tablet 1 mg  1 mg Oral Daily Arnetha Courser, MD   1 mg at 10/22/20 1026  . sodium chloride flush (NS) 0.9 % injection 3 mL  3 mL Intravenous Q12H Agbata, Tochukwu, MD   3 mL at 10/22/20 1028  . sodium chloride flush (NS) 0.9 % injection 3 mL  3 mL Intravenous PRN Agbata, Tochukwu, MD         Discharge Medications: Please  see discharge summary for a list of discharge medications.  Relevant Imaging Results:  Relevant Lab Results:   Additional Information SS #: [id number]  Adaleena Mooers E Desarea [place], LCSW

## 2020-10-22 NOTE — Progress Notes (Addendum)
PROGRESS NOTE    Joshua George  HFW:[id number] DOB: February 14, 1941 DOA: 10/20/2020 PCP: Kandyce Rud, MD   Brief Narrative: Taken from H&P. Joshua George is a 80 y.o. male with medical history significant for Parkinson's disease, GERD, BPH and dementia who presents to the ER by EMS for evaluation of progressively worsening generalized weakness requiring a 1 person assist which is unusual for the patient.  Poor p.o. intake and worsening generalized weakness over the past 1 to 2 weeks. Recently treated for a presumed UTI. Patient was found to have AKI and urinary retention.  Foley with more than 2 L of urine, AKI started improving with Foley.  Patient has slurred speech and dysarthria since his surgery on lower jaw when part of his jaw and teeth were removed for some growth per wife. Imaging with right-sided pleural effusion s/p thoracentesis and initial labs consistent with transudate. CT abdomen with bilateral nephrolithiasis and right hydronephrosis with prominent left perinephric edema. Urology was also consulted and they are recommending continue with Foley and outpatient follow-up in 2 weeks.  Subjective: Patient was sleeping when seen today. Wife at bedside, according to her he ate his breakfast better than yesterday, able to eat about half of it. He did not get a good night sleep so she was requesting to let him sleep.  Assessment & Plan:   Principal Problem:   AKI (acute kidney injury) (HCC) Active Problems:   Hyponatremia   CAP (community acquired pneumonia)   Parapneumonic effusion   Parkinson's disease (HCC)   GERD (gastroesophageal reflux disease)  AKI. Resolved Significant improvement in creatinine after Foley placement.  Most likely secondary to urinary obstruction.  Baseline around 1 -Continue to monitor -Avoid nephrotoxins -Patient will be discharged with Foley catheter and will follow up with urology as an outpatient.  Pleural effusion.  Patient with bilateral pleural  effusions, left more than right with some concern of left lower lobe collapse.  Patient with leukocytosis with left shift.  No prior history of heart failure. S/p thoracentesis with removal of about 165 0 mL of fluid, initial labs appears transudate, cultures negative. -Switch IV antibiotics with p.o. to complete a 5-day course. -Echocardiogram done-pending results  Hypernatremia. Patient with mild hypernatremia today. Appears dry. -Give him 1 L of D5 at 100 mL/h for 10 hours. -Encourage p.o. hydration with free water. -Continue to monitor  Parkinsonism with worsening generalized weakness.  May be disease progression.  PT is recommending SNF placement. -TOC consult for placement. -Continue home meds  Addendum. Received message from nursing staff that patient become febrile and more lethargic. Not following commands. -We will try Tylenol, can use suppository. -Continue to monitor. -Continue with antibiotics.  Objective: Vitals:   10/21/20 2112 10/22/20 0300 10/22/20 0430 10/22/20 0747  BP: (!) 162/94  (!) 182/76 (!) 178/87  Pulse: 91  81 65  Resp: 18  18 20   Temp: 98.6 F (37 C)  98.1 F (36.7 C) 99.3 F (37.4 C)  TempSrc: Oral  Oral Oral  SpO2: 96%   95%  Weight:  60 kg    Height:        Intake/Output Summary (Last 24 hours) at 10/22/2020 1439 Last data filed at 10/22/2020 1039 Gross per 24 hour  Intake --  Output 1875 ml  Net -1875 ml   Filed Weights   10/20/20 1618 10/21/20 0323 10/22/20 0300  Weight: 62.1 kg 59.5 kg 60 kg    Examination:  General. Chronically ill-appearing, frail elderly man, in no acute distress. Pulmonary.  Lungs clear bilaterally, normal respiratory effort. CV.  Regular rate and rhythm, no JVD, rub or murmur. Abdomen.  Soft, nontender, nondistended, BS positive. CNS.  Alert and oriented x3.  No focal neurologic deficit. Extremities.  No edema, no cyanosis, pulses intact and symmetrical. Psychiatry.  Judgment and insight appears  impaired.  DVT prophylaxis: Heparin Code Status: DNR Family Communication: Wife was updated at bedside Disposition Plan:  Status is: Inpatient  Remains inpatient appropriate because:Inpatient level of care appropriate due to severity of illness   Dispo: The patient is from: Home              Anticipated d/c is to: SNF              Anticipated d/c date is: 1 day.              Patient currently is not medically stable.   Difficult to place patient Yes              Level of care: Progressive Cardiac  Consultants:   Urology  Nephrology  Procedures:  Antimicrobials:  Ceftin Zithromax  Data Reviewed: I have personally reviewed following labs and imaging studies  CBC: Recent Labs  Lab 10/20/20 0848 10/21/20 0502 10/22/20 0604  WBC 13.7* 12.3* 12.6*  NEUTROABS 11.5*  --   --   HGB 14.0 13.4 12.5*  HCT 40.8 38.5* 37.7*  MCV 87.7 88.9 90.6  PLT 186 182 201   Basic Metabolic Panel: Recent Labs  Lab 10/20/20 0848 10/21/20 0502 10/22/20 0604  NA 129* 145 148*  K 5.1 3.8 3.5  CL 91* 111 114*  CO2 22 21* 24  GLUCOSE 92 87 100*  BUN 118* 38* 19  CREATININE 8.87* 1.80* 0.89  CALCIUM 8.8* 8.1* 7.7*   GFR: Estimated Creatinine Clearance: 58.1 mL/min (by C-G formula based on SCr of 0.89 mg/dL). Liver Function Tests: Recent Labs  Lab 10/20/20 0848  AST 14*  ALT <5  ALKPHOS 91  BILITOT 1.4*  PROT 7.1  ALBUMIN 3.3*   No results for input(s): LIPASE, AMYLASE in the last 168 hours. No results for input(s): AMMONIA in the last 168 hours. Coagulation Profile: No results for input(s): INR, PROTIME in the last 168 hours. Cardiac Enzymes: No results for input(s): CKTOTAL, CKMB, CKMBINDEX, TROPONINI in the last 168 hours. BNP (last 3 results) No results for input(s): PROBNP in the last 8760 hours. HbA1C: No results for input(s): HGBA1C in the last 72 hours. CBG: No results for input(s): GLUCAP in the last 168 hours. Lipid Profile: No results for input(s): CHOL,  HDL, LDLCALC, TRIG, CHOLHDL, LDLDIRECT in the last 72 hours. Thyroid Function Tests: No results for input(s): TSH, T4TOTAL, FREET4, T3FREE, THYROIDAB in the last 72 hours. Anemia Panel: No results for input(s): VITAMINB12, FOLATE, FERRITIN, TIBC, IRON, RETICCTPCT in the last 72 hours. Sepsis Labs: Recent Labs  Lab 10/20/20 0848 10/20/20 1038 10/22/20 0604  PROCALCITON  --   --  0.31  LATICACIDVEN 1.3 1.3  --     Recent Results (from the past 240 hour(s))  Resp Panel by RT-PCR (Flu A&B, Covid) Nasopharyngeal Swab     Status: None   Collection Time: 10/20/20  8:49 AM   Specimen: Nasopharyngeal Swab; Nasopharyngeal(NP) swabs in vial transport medium  Result Value Ref Range Status   SARS Coronavirus 2 by RT PCR NEGATIVE NEGATIVE Final    Comment: (NOTE) SARS-CoV-2 target nucleic acids are NOT DETECTED.  The SARS-CoV-2 RNA is generally detectable in upper respiratory specimens during  the acute phase of infection. The lowest concentration of SARS-CoV-2 viral copies this assay can detect is 138 copies/mL. A negative result does not preclude SARS-Cov-2 infection and should not be used as the sole basis for treatment or other patient management decisions. A negative result may occur with  improper specimen collection/handling, submission of specimen other than nasopharyngeal swab, presence of viral mutation(s) within the areas targeted by this assay, and inadequate number of viral copies(<138 copies/mL). A negative result must be combined with clinical observations, patient history, and epidemiological information. The expected result is Negative.  Fact Sheet for Patients:  BloggerCourse.com  Fact Sheet for Healthcare Providers:  SeriousBroker.it  This test is no t yet approved or cleared by the [place] FDA and  has been authorized for detection and/or diagnosis of SARS-CoV-2 by FDA under an Emergency Use Authorization (EUA).  This EUA will remain  in effect (meaning this test can be used) for the duration of the COVID-19 declaration under Section 564(b)(1) of the Act, 21 U.S.C.section 360bbb-3(b)(1), unless the authorization is terminated  or revoked sooner.       Influenza A by PCR NEGATIVE NEGATIVE Final   Influenza B by PCR NEGATIVE NEGATIVE Final    Comment: (NOTE) The Xpert Xpress SARS-CoV-2/FLU/RSV plus assay is intended as an aid in the diagnosis of influenza from Nasopharyngeal swab specimens and should not be used as a sole basis for treatment. Nasal washings and aspirates are unacceptable for Xpert Xpress SARS-CoV-2/FLU/RSV testing.  Fact Sheet for Patients: BloggerCourse.com  Fact Sheet for Healthcare Providers: SeriousBroker.it  This test is not yet approved or cleared by the [place] FDA and has been authorized for detection and/or diagnosis of SARS-CoV-2 by FDA under an Emergency Use Authorization (EUA). This EUA will remain in effect (meaning this test can be used) for the duration of the COVID-19 declaration under Section 564(b)(1) of the Act, 21 U.S.C. section 360bbb-3(b)(1), unless the authorization is terminated or revoked.  Performed at [facility], [address] [place], [place] [address]   Urine culture     Status: None   Collection Time: 10/20/20 11:35 AM   Specimen: Urine, Random  Result Value Ref Range Status   Specimen Description   Final    URINE, RANDOM Performed at [facility], [address] [place], [place] [address]    Special Requests   Final    NONE Performed at [facility], [address], [place], [place] [address]    Culture   Final    NO GROWTH Performed at [facility], [address] [place], [place] [address]    Report Status 10/21/2020 FINAL  Final  Body fluid culture     Status: None (Preliminary result)   Collection Time: 10/20/20  1:30 PM    Specimen: PATH Cytology Pleural fluid  Result Value Ref Range Status   Specimen Description   Final    PLEURAL Performed at [facility], [address] [place], [place] [address]    Special Requests   Final    NONE Performed at [facility], [address] [place], [place] [address]    Gram Stain   Final    MODERATE WBC PRESENT,BOTH PMN AND MONONUCLEAR NO ORGANISMS SEEN    Culture   Final    NO GROWTH 2 DAYS Performed at [facility], [address] [place], [place] [address]    Report Status PENDING  Incomplete     Radiology Studies:  DG Chest 2 View  Result Date: 10/22/2020 CLINICAL DATA:  History of pleural effusion EXAM: CHEST - 2 VIEW COMPARISON:  10/20/2020 FINDINGS: Cardiac shadow is stable. Small posterior pleural effusions are noted. Increasing interstitial changes noted bilaterally likely representing edema. No pneumothorax is seen. No bony abnormality is noted. IMPRESSION: Changes most consistent with increasing parenchymal edema and small effusions. Electronically Signed   By: Alcide Clever M.D.   On: 10/22/2020 11:47    Scheduled Meds: . amantadine  100 mg Oral BID  . carbidopa-levodopa  1.5 tablet Oral QID  . [START ON 10/23/2020] cefUROXime  500 mg Oral BID WC  . Chlorhexidine Gluconate Cloth  6 each Topical Daily  . clonazePAM  1 mg Oral QHS  . entacapone  200 mg Oral QID  . heparin  5,000 Units Subcutaneous Q8H  . mouth rinse  15 mL Mouth Rinse BID  . rasagiline  1 mg Oral Daily  . sodium chloride flush  3 mL Intravenous Q12H   Continuous Infusions: . sodium chloride    . dextrose       LOS: 2 days   Time spent: 30 minutes.  Arnetha Courser, MD [facility]  If 7PM-7AM, please contact night-coverage [email or web address]  10/22/2020, 2:39 PM   This record has been created using Conservation officer, historic buildings. Errors have been sought and corrected,but may not always be located. Such creation errors do not reflect on the  standard of care.

## 2020-10-22 NOTE — Progress Notes (Signed)
*  PRELIMINARY RESULTS* Echocardiogram 2D Echocardiogram has been performed.  Joshua George 10/22/2020, 9:53 AM 

## 2020-10-22 NOTE — Progress Notes (Signed)
   10/22/20 1509  Vitals  Temp (!) 100.6 F (38.1 C)  Temp Source Oral  BP (!) 155/87  MAP (mmHg) 107  BP Location Left Arm  BP Method Automatic  Patient Position (if appropriate) Lying  Pulse Rate 80  Resp (!) 22  MEWS COLOR  MEWS Score Color Yellow  Oxygen Therapy  SpO2 (!) 89 %  O2 Device Room Air  MEWS Score  MEWS Temp 1  MEWS Systolic 0  MEWS Pulse 0  MEWS RR 1  MEWS LOC 0  MEWS Score 2    Charge RN and Ambulance person made aware. MD notified via secure chat. Patient more lethargic.

## 2020-10-22 NOTE — TOC Initial Note (Signed)
Transition of Care [facility]) - Initial/Assessment Note    Patient Details  Name: Joshua George MRN: [id number] Date of Birth: [birth year]  [facility] CM/SW Contact:    Chapman Fitch, RN Phone Number: 10/22/2020, 3:42 PM  Clinical Narrative:                  Pasrr received [phone] A Patient A&O x2.   Attempted to call wife x2 to complete assessment.  Was unable to reach. Voicemail left  Per chart review patient lives at home with wife, PCP Larwance Sachs, has a cane at home  PT has assessed patient and recommends SNF.  Per MD it is the wife wishes to pursue SNF.   Obtained PASRR fl2 sent for signature.   Bed search initiated        Patient Goals and CMS Choice        Expected Discharge Plan and Services                                                Prior Living Arrangements/Services                       Activities of Daily Living      Permission Sought/Granted                  Emotional Assessment              Admission diagnosis:  SOB (shortness of breath) [R06.02] Pleural effusion [J90] S/P thoracentesis [Z98.890] AKI (acute kidney injury) (HCC) [N17.9] Acute kidney injury (HCC) [N17.9] Altered mental status, unspecified altered mental status type [R41.82] Community acquired pneumonia, unspecified laterality [J18.9] Patient Active Problem List   Diagnosis Date Noted  . AKI (acute kidney injury) (HCC) 10/20/2020  . Hyponatremia 10/20/2020  . CAP (community acquired pneumonia) 10/20/2020  . Parapneumonic effusion 10/20/2020  . Parkinson's disease (HCC)   . GERD (gastroesophageal reflux disease)    PCP:  Kandyce Rud, MD Pharmacy:  No Pharmacies Listed    Social Determinants of Health (SDOH) Interventions    Readmission Risk Interventions No flowsheet data found.

## 2020-10-23 LAB — CBC
HCT: 39.7 % (ref 39.0–52.0)
Hemoglobin: 13.3 g/dL (ref 13.0–17.0)
MCH: 30.6 pg (ref 26.0–34.0)
MCHC: 33.5 g/dL (ref 30.0–36.0)
MCV: 91.3 fL (ref 80.0–100.0)
Platelets: 195 10*3/uL (ref 150–400)
RBC: 4.35 MIL/uL (ref 4.22–5.81)
RDW: 12.8 % (ref 11.5–15.5)
WBC: 12.3 10*3/uL — ABNORMAL HIGH (ref 4.0–10.5)
nRBC: 0 % (ref 0.0–0.2)

## 2020-10-23 LAB — BASIC METABOLIC PANEL
Anion gap: 11 (ref 5–15)
BUN: 19 mg/dL (ref 8–23)
CO2: 26 mmol/L (ref 22–32)
Calcium: 7.8 mg/dL — ABNORMAL LOW (ref 8.9–10.3)
Chloride: 111 mmol/L (ref 98–111)
Creatinine, Ser: 0.85 mg/dL (ref 0.61–1.24)
GFR, Estimated: >60 mL/min (ref >60)
Glucose, Bld: 91 mg/dL (ref 70–99)
Potassium: 3.4 mmol/L — ABNORMAL LOW (ref 3.5–5.1)
Sodium: 148 mmol/L — ABNORMAL HIGH (ref 135–145)

## 2020-10-23 LAB — BODY FLUID CULTURE: Culture: NO GROWTH

## 2020-10-23 LAB — MAGNESIUM: Magnesium: 2.1 mg/dL (ref 1.7–2.4)

## 2020-10-23 NOTE — Progress Notes (Signed)
PROGRESS NOTE    Joshua George  QJF:[id number] DOB: 1941-06-22 DOA: 10/20/2020 PCP: Kandyce Rud, MD   Brief Narrative: Taken from H&P. Joshua George is a 80 y.o. male with medical history significant for Parkinson's disease, GERD, BPH and dementia who presents to the ER by EMS for evaluation of progressively worsening generalized weakness requiring a 1 person assist which is unusual for the patient.  Poor p.o. intake and worsening generalized weakness over the past 1 to 2 weeks. Recently treated for a presumed UTI. Patient was found to have AKI and urinary retention.  Foley with more than 2 L of urine, AKI started improving with Foley.  Patient has slurred speech and dysarthria since his surgery on lower jaw when part of his jaw and teeth were removed for some growth per wife. Imaging with right-sided pleural effusion s/p thoracentesis and initial labs consistent with transudate. CT abdomen with bilateral nephrolithiasis and right hydronephrosis with prominent left perinephric edema. Urology was also consulted and they are recommending continue with Foley and outpatient follow-up in 2 weeks.  Patient developed fever yesterday afternoon along with increased lethargy and decreased responsiveness.  Procalcitonin positive at 0.31, pending repeat urine and blood cultures.  Subjective: Patient appears very lethargic and did not following any commands.  Wife at bedside and according to her he was able to eat some applesauce this morning.  Assessment & Plan:   Principal Problem:   AKI (acute kidney injury) (HCC) Active Problems:   Hyponatremia   CAP (community acquired pneumonia)   Parapneumonic effusion   Parkinson's disease (HCC)   GERD (gastroesophageal reflux disease)  Fever.  Patient developed fever yesterday afternoon with maximum temperature recorded was 101.2.  Becomes more lethargic.  CBC with persistent leukocytosis at 12.3 which seems little improved. Initial urine, pleural and  blood cultures were negative. Chest x-ray without any obvious infiltrate but procalcitonin at 0.31. -Recheck urine and blood cultures. -Follow-up procalcitonin. -Continue with current antibiotics at this time.  AKI. Resolved Significant improvement in creatinine after Foley placement.  Most likely secondary to urinary obstruction.  Baseline around 1 -Continue to monitor -Avoid nephrotoxins -Patient will be discharged with Foley catheter and will follow up with urology as an outpatient.  Pleural effusion.  Patient with bilateral pleural effusions, left more than right with some concern of left lower lobe collapse.  Patient with leukocytosis with left shift.  No prior history of heart failure. S/p thoracentesis with removal of about 165 0 mL of fluid, initial labs appears transudate, cultures negative. -Repeat chest x-ray yesterday with small posterior pleural effusions and some interstitial congestion.  No obvious infiltrate. -Echocardiogram done-normal EF, no wall motion abnormalities and mild to moderate valvular regurgitations.  Hypernatremia. Patient with persistent sodium at 148 -Continue D5 at 50cc/h for maintenance. -Encourage p.o. hydration if able to take. -Continue to monitor  Parkinsonism with worsening generalized weakness.  May be disease progression.  PT is recommending SNF placement. -TOC consult for placement. -Continue home meds   Objective: Vitals:   10/23/20 0410 10/23/20 0540 10/23/20 0919 10/23/20 1137  BP: (!) 173/83 (!) 159/88 (!) 146/80 (!) 155/87  Pulse: 85  79 84  Resp: 18     Temp: 98 F (36.7 C)  98.5 F (36.9 C) 98.9 F (37.2 C)  TempSrc:   Axillary Oral  SpO2: 99%  95% 100%  Weight:      Height:        Intake/Output Summary (Last 24 hours) at 10/23/2020 1435 Last data filed at 10/23/2020  1036 Gross per 24 hour  Intake 685.82 ml  Output 2350 ml  Net -1664.18 ml   Filed Weights   10/21/20 0323 10/22/20 0300 10/23/20 0100  Weight: 59.5 kg 60  kg 61.9 kg    Examination:  General.  Frail, lethargic appearing elderly man, in no acute distress. Pulmonary.  Lungs clear bilaterally, normal respiratory effort. CV.  Regular rate and rhythm, no JVD, rub or murmur. Abdomen.  Soft, nontender, nondistended, BS positive. CNS.  Somnolent, not following commands. Extremities.  No edema, no cyanosis, pulses intact and symmetrical. Psychiatry.  Judgment and insight appears impaired  DVT prophylaxis: Heparin Code Status: DNR Family Communication: Wife was updated at bedside Disposition Plan:  Status is: Inpatient  Remains inpatient appropriate because:Inpatient level of care appropriate due to severity of illness   Dispo: The patient is from: Home              Anticipated d/c is to: SNF              Anticipated d/c date is: 2 day.              Patient currently is not medically stable.   Difficult to place patient Yes              Level of care: Med-Surg  Consultants:   Urology  Nephrology  Procedures:  Antimicrobials:  Zithromax Ceftriaxone  Data Reviewed: I have personally reviewed following labs and imaging studies  CBC: Recent Labs  Lab 10/20/20 0848 10/21/20 0502 10/22/20 0604 10/23/20 0904  WBC 13.7* 12.3* 12.6* 12.3*  NEUTROABS 11.5*  --   --   --   HGB 14.0 13.4 12.5* 13.3  HCT 40.8 38.5* 37.7* 39.7  MCV 87.7 88.9 90.6 91.3  PLT 186 182 201 195   Basic Metabolic Panel: Recent Labs  Lab 10/20/20 0848 10/21/20 0502 10/22/20 0604 10/23/20 0415 10/23/20 0904  NA 129* 145 148* 148*  --   K 5.1 3.8 3.5 3.4*  --   CL 91* 111 114* 111  --   CO2 22 21* 24 26  --   GLUCOSE 92 87 100* 91  --   BUN 118* 38* 19 19  --   CREATININE 8.87* 1.80* 0.89 0.85  --   CALCIUM 8.8* 8.1* 7.7* 7.8*  --   MG  --   --   --   --  2.1   GFR: Estimated Creatinine Clearance: 62.7 mL/min (by C-G formula based on SCr of 0.85 mg/dL). Liver Function Tests: Recent Labs  Lab 10/20/20 0848  AST 14*  ALT <5  ALKPHOS 91   BILITOT 1.4*  PROT 7.1  ALBUMIN 3.3*   No results for input(s): LIPASE, AMYLASE in the last 168 hours. No results for input(s): AMMONIA in the last 168 hours. Coagulation Profile: No results for input(s): INR, PROTIME in the last 168 hours. Cardiac Enzymes: No results for input(s): CKTOTAL, CKMB, CKMBINDEX, TROPONINI in the last 168 hours. BNP (last 3 results) No results for input(s): PROBNP in the last 8760 hours. HbA1C: No results for input(s): HGBA1C in the last 72 hours. CBG: No results for input(s): GLUCAP in the last 168 hours. Lipid Profile: No results for input(s): CHOL, HDL, LDLCALC, TRIG, CHOLHDL, LDLDIRECT in the last 72 hours. Thyroid Function Tests: No results for input(s): TSH, T4TOTAL, FREET4, T3FREE, THYROIDAB in the last 72 hours. Anemia Panel: No results for input(s): VITAMINB12, FOLATE, FERRITIN, TIBC, IRON, RETICCTPCT in the last 72 hours. Sepsis Labs: Recent Labs  Lab 10/20/20 0848 10/20/20 1038 10/22/20 0604  PROCALCITON  --   --  0.31  LATICACIDVEN 1.3 1.3  --     Recent Results (from the past 240 hour(s))  Resp Panel by RT-PCR (Flu A&B, Covid) Nasopharyngeal Swab     Status: None   Collection Time: 10/20/20  8:49 AM   Specimen: Nasopharyngeal Swab; Nasopharyngeal(NP) swabs in vial transport medium  Result Value Ref Range Status   SARS Coronavirus 2 by RT PCR NEGATIVE NEGATIVE Final    Comment: (NOTE) SARS-CoV-2 target nucleic acids are NOT DETECTED.  The SARS-CoV-2 RNA is generally detectable in upper respiratory specimens during the acute phase of infection. The lowest concentration of SARS-CoV-2 viral copies this assay can detect is 138 copies/mL. A negative result does not preclude SARS-Cov-2 infection and should not be used as the sole basis for treatment or other patient management decisions. A negative result may occur with  improper specimen collection/handling, submission of specimen other than nasopharyngeal swab, presence of viral  mutation(s) within the areas targeted by this assay, and inadequate number of viral copies(<138 copies/mL). A negative result must be combined with clinical observations, patient history, and epidemiological information. The expected result is Negative.  Fact Sheet for Patients:  BloggerCourse.comhttps://www.fda.gov/media/152166/download  Fact Sheet for Healthcare Providers:  SeriousBroker.ithttps://www.fda.gov/media/152162/download  This test is no t yet approved or cleared by the [place] FDA and  has been authorized for detection and/or diagnosis of SARS-CoV-2 by FDA under an Emergency Use Authorization (EUA). This EUA will remain  in effect (meaning this test can be used) for the duration of the COVID-19 declaration under Section 564(b)(1) of the Act, 21 U.S.C.section 360bbb-3(b)(1), unless the authorization is terminated  or revoked sooner.       Influenza A by PCR NEGATIVE NEGATIVE Final   Influenza B by PCR NEGATIVE NEGATIVE Final    Comment: (NOTE) The Xpert Xpress SARS-CoV-2/FLU/RSV plus assay is intended as an aid in the diagnosis of influenza from Nasopharyngeal swab specimens and should not be used as a sole basis for treatment. Nasal washings and aspirates are unacceptable for Xpert Xpress SARS-CoV-2/FLU/RSV testing.  Fact Sheet for Patients: BloggerCourse.comhttps://www.fda.gov/media/152166/download  Fact Sheet for Healthcare Providers: SeriousBroker.ithttps://www.fda.gov/media/152162/download  This test is not yet approved or cleared by the [place] FDA and has been authorized for detection and/or diagnosis of SARS-CoV-2 by FDA under an Emergency Use Authorization (EUA). This EUA will remain in effect (meaning this test can be used) for the duration of the COVID-19 declaration under Section 564(b)(1) of the Act, 21 U.S.C. section 360bbb-3(b)(1), unless the authorization is terminated or revoked.  Performed at [facility], [address] [place], [place] [address]   Urine culture     Status: None    Collection Time: 10/20/20 11:35 AM   Specimen: Urine, Random  Result Value Ref Range Status   Specimen Description   Final    URINE, RANDOM Performed at [facility], [address] [place], [place] [address]    Special Requests   Final    NONE Performed at [facility], [address] [place], [place] [address]    Culture   Final    NO GROWTH Performed at [facility], [address] [place], [place] [address]    Report Status 10/21/2020 FINAL  Final  Body fluid culture     Status: None   Collection Time: 10/20/20  1:30 PM   Specimen: PATH Cytology Pleural fluid  Result Value Ref Range Status  Specimen Description   Final    PLEURAL Performed at [facility], [address] [place] [place], [place] [address]    Special Requests   Final    NONE Performed at [facility], [address] [place], [place] [address]    Gram Stain   Final    MODERATE WBC PRESENT,BOTH PMN AND MONONUCLEAR NO ORGANISMS SEEN    Culture   Final    NO GROWTH 3 DAYS Performed at [facility], [address] [place], [place] [address]    Report Status 10/23/2020 FINAL  Final     Radiology Studies: DG Chest 2 View  Result Date: 10/22/2020 CLINICAL DATA:  History of pleural effusion EXAM: CHEST - 2 VIEW COMPARISON:  10/20/2020 FINDINGS: Cardiac shadow is stable. Small posterior pleural effusions are noted. Increasing interstitial changes noted bilaterally likely representing edema. No pneumothorax is seen. No bony abnormality is noted. IMPRESSION: Changes most consistent with increasing parenchymal edema and small effusions. Electronically Signed   By: Alcide Clever M.D.   On: 10/22/2020 11:47   ECHOCARDIOGRAM COMPLETE  Result Date: 10/22/2020    ECHOCARDIOGRAM REPORT   Patient Name:   Pavlos Broman Date of Exam: 10/22/2020 Medical Rec #:  [id number]    Height:       68.0 in Accession #:    [id number]   Weight:       132.3 lb Date of Birth:  1941/09/05     BSA:          1.714 m Patient Age:    79 years     BP:           178/87 mmHg Patient Gender: M            HR:           62 bpm. Exam Location:  [facility] Procedure: 2D Echo, Color Doppler and Cardiac Doppler Indications:     I50.9 Congestive Heart Failure  History:         Patient has no prior history of Echocardiogram examinations.                  Parkinson's disease; Signs/Symptoms:Dementia.  Sonographer:     Humphrey Rolls RDCS (AE) Referring Phys:  [id number] [facility] Diagnosing Phys: Julien Nordmann MD  Sonographer Comments: No subcostal window. IMPRESSIONS  1. Left ventricular ejection fraction, by estimation, is 60 to 65%. The left ventricle has normal function. The left ventricle has no regional wall motion abnormalities. Left ventricular diastolic parameters are consistent with Grade I diastolic dysfunction (impaired relaxation).  2. Right ventricular systolic function is normal. The right ventricular size is normal.  3. Left atrial size was mildly dilated.  4. Mild to moderate mitral valve regurgitation.  5. Tricuspid valve regurgitation is mild to moderate.  6. Aortic valve regurgitation is mild to moderate. FINDINGS  Left Ventricle: Left ventricular ejection fraction, by estimation, is 60 to 65%. The left ventricle has normal function. The left ventricle has no regional wall motion abnormalities. The left ventricular internal cavity size was normal in size. There is  no left ventricular hypertrophy. Left ventricular diastolic parameters are consistent with Grade I diastolic dysfunction (impaired relaxation). Right Ventricle: The right ventricular size is normal. No increase in right ventricular wall thickness. Right ventricular systolic function is normal. Left Atrium: Left atrial size was mildly dilated. Right Atrium: Right atrial size was normal in size. Pericardium: There is no evidence of pericardial effusion. Mitral Valve: The mitral valve is  normal in structure. Mild to moderate mitral valve  regurgitation. No evidence of mitral valve stenosis. MV peak gradient, 2.4 mmHg. The mean mitral valve gradient is 1.0 mmHg. Tricuspid Valve: The tricuspid valve is normal in structure. Tricuspid valve regurgitation is mild to moderate. No evidence of tricuspid stenosis. Aortic Valve: The aortic valve was not well visualized. Aortic valve regurgitation is mild to moderate. Aortic regurgitation PHT measures 310 msec. No aortic stenosis is present. Aortic valve mean gradient measures 5.0 mmHg. Aortic valve peak gradient measures 9.7 mmHg. Aortic valve area, by VTI measures 2.95 cm. Pulmonic Valve: The pulmonic valve was normal in structure. Pulmonic valve regurgitation is not visualized. No evidence of pulmonic stenosis. Aorta: The aortic root is normal in size and structure. Venous: The inferior vena cava is normal in size with greater than 50% respiratory variability, suggesting right atrial pressure of 3 mmHg. IAS/Shunts: No atrial level shunt detected by color flow Doppler.  LEFT VENTRICLE PLAX 2D LVIDd:         4.40 cm  Diastology LVIDs:         3.00 cm  LV e' medial:    8.92 cm/s LV PW:         1.00 cm  LV E/e' medial:  7.5 LV IVS:        0.70 cm  LV e' lateral:   10.60 cm/s LVOT diam:     2.10 cm  LV E/e' lateral: 6.3 LV SV:         88 LV SV Index:   52 LVOT Area:     3.46 cm  RIGHT VENTRICLE RV Basal diam:  3.30 cm TAPSE (M-mode): 3.4 cm LEFT ATRIUM             Index       RIGHT ATRIUM           Index LA diam:        4.40 cm 2.57 cm/m  RA Area:     10.80 cm LA Vol (A2C):   53.5 ml 31.21 ml/m RA Volume:   22.40 ml  13.07 ml/m LA Vol (A4C):   80.2 ml 46.78 ml/m LA Biplane Vol: 65.7 ml 38.32 ml/m  AORTIC VALVE                    PULMONIC VALVE AV Area (Vmax):    2.69 cm     PV Vmax:       0.98 m/s AV Area (Vmean):   2.87 cm     PV Vmean:      66.200 cm/s AV Area (VTI):     2.95 cm     PV VTI:        0.176 m AV Vmax:           156.00 cm/s  PV Peak grad:  3.8 mmHg AV Vmean:          103.000 cm/s PV Mean  grad:  2.0 mmHg AV VTI:            0.299 m AV Peak Grad:      9.7 mmHg AV Mean Grad:      5.0 mmHg LVOT Vmax:         121.00 cm/s LVOT Vmean:        85.300 cm/s LVOT VTI:          0.255 m LVOT/AV VTI ratio: 0.85 AI PHT:            310 msec  AORTA  Ao Root diam: 3.80 cm MITRAL VALVE MV Area (PHT): 5.54 cm    SHUNTS MV Area VTI:   5.10 cm    Systemic VTI:  0.26 m MV Peak grad:  2.4 mmHg    Systemic Diam: 2.10 cm MV Mean grad:  1.0 mmHg MV Vmax:       0.78 m/s MV Vmean:      49.6 cm/s MV Decel Time: 137 msec MV E velocity: 67.30 cm/s MV A velocity: 85.30 cm/s MV E/A ratio:  0.79 Julien Nordmann MD Electronically signed by Julien Nordmann MD Signature Date/Time: 10/22/2020/3:09:39 PM    Final     Scheduled Meds: . amantadine  100 mg Oral BID  . carbidopa-levodopa  1.5 tablet Oral QID  . Chlorhexidine Gluconate Cloth  6 each Topical Daily  . clonazePAM  1 mg Oral QHS  . entacapone  200 mg Oral QID  . heparin  5,000 Units Subcutaneous Q8H  . mouth rinse  15 mL Mouth Rinse BID  . rasagiline  1 mg Oral Daily  . sodium chloride flush  3 mL Intravenous Q12H   Continuous Infusions: . sodium chloride    . azithromycin 500 mg (10/23/20 1140)  . cefTRIAXone (ROCEPHIN)  IV 2 g (10/23/20 1016)     LOS: 3 days   Time spent: 35 minutes.  Arnetha Courser, MD [facility]  If 7PM-7AM, please contact night-coverage [email or web address]  10/23/2020, 2:35 PM   This record has been created using Conservation officer, historic buildings. Errors have been sought and corrected,but may not always be located. Such creation errors do not reflect on the standard of care.

## 2020-10-23 NOTE — Care Management Important Message (Signed)
Important Message  Patient Details  Name: Joshua George MRN: [id number] Date of Birth: February 21, 1941   Medicare Important Message Given:  Yes     [name] 10/23/2020, 1:10 PM

## 2020-10-23 NOTE — TOC Progression Note (Signed)
Transition of Care [facility]) - Progression Note    Patient Details  Name: Joshua George MRN: [id number] Date of Birth: 08-Sep-1941  [facility] CM/SW Contact  Hetty Ely, RN Phone Number: 10/23/2020, 3:00 PM  Clinical Narrative:  Patient asleep, wife at bedside, discussed SNF acceptance for [facility] in [place], wife refuses states she prefers a Designer, multimedia facility. Attending in the room and agrees that we should attempt to find local facility. Will call and attempt.          Expected Discharge Plan and Services                                                 Social Determinants of Health (SDOH) Interventions    Readmission Risk Interventions No flowsheet data found.

## 2020-10-24 LAB — BASIC METABOLIC PANEL
Anion gap: 11 (ref 5–15)
BUN: 24 mg/dL — ABNORMAL HIGH (ref 8–23)
CO2: 27 mmol/L (ref 22–32)
Calcium: 7.9 mg/dL — ABNORMAL LOW (ref 8.9–10.3)
Chloride: 109 mmol/L (ref 98–111)
Creatinine, Ser: 0.92 mg/dL (ref 0.61–1.24)
GFR, Estimated: >60 mL/min (ref >60)
Glucose, Bld: 100 mg/dL — ABNORMAL HIGH (ref 70–99)
Potassium: 3.5 mmol/L (ref 3.5–5.1)
Sodium: 147 mmol/L — ABNORMAL HIGH (ref 135–145)

## 2020-10-24 LAB — PROCALCITONIN: Procalcitonin: 0.16 ng/mL

## 2020-10-24 MED ORDER — ENSURE ENLIVE PO LIQD
237.0000 mL | Freq: Two times a day (BID) | ORAL | Status: DC
  Administered 2020-10-24 – 2020-10-26 (×4): 237 mL via ORAL

## 2020-10-24 MED ORDER — POLYETHYLENE GLYCOL 3350 17 G PO PACK
17.0000 g | PACK | Freq: Every day | ORAL | Status: DC
  Administered 2020-10-24 – 2020-10-28 (×5): 17 g via ORAL
  Filled 2020-10-24 (×5): qty 1

## 2020-10-24 NOTE — Progress Notes (Signed)
Physical Therapy Treatment Patient Details Name: Joshua George MRN: [id number] DOB: 1941-07-31 Today's Date: 10/24/2020    History of Present Illness Pt is a 80yo M admitted to [facility] on 10/20/20 for c/o increased weakness, confusion, agitation, and slurred speech. Spouse notes decreased oral intake and generalized weakness. Significant PMH includes: PD and dementia. Pt notes that he  has been on/off of his PD medications for about a week, but resumed medication a few days ago. Pt dx with UTI last week and is currently being treated with antibiotic. Pt s/p L thoracentesis yielding 1661m of yellow fluid. Imaging revealed large L sided pleural effusion with underlying atelectasis/infiltrate and small R sided pleural effusion.    PT Comments    Pt received lying in semi-fowler's position, however had slid towards the foot of the bed.  Pt's wife was present in room upon arrival and pt was agreeable to therapy.  Pt much better cognitively today, being able to respond and open eyes throughout session.  Pt was given damp cloth to clean eyes, as he had residue built up around his eyelids.  After cleaning, pt was able to see and open his eye much easier.  Pt and wife note that he is sensitive to the light coming in from the windows. Pt was able to perform bed-level exercises with good muscle contractions.  Notable muscle trembling in the L quads with quads sets towards the end of the set.  Pt then able to perform bed mobility with increased time as noted below.  Pt then required only CGA for safety.  Pt then able to stand upright and noted slight dizziness upon coming upright.  Therapist waited for dizziness to subside before continue with gait assessment.  Pt able to perform weight shifts and standing marches before attempting to walk to door.  Pt was able to ambulate 6 feet before noting that he felt as though he was going to pass out.  Pt stood stationary for a few seconds before attempting to go back to the bed.   Pt never lost consciousness and was able to safely make it back to bed when nurse staff came in to get vitals.  Pt BP was WNL in supine position.  Pt was left with all needs met and nursing in room.  Current discharge plans to SNF remain appropriate at this time.  Pt will continue to benefit from skilled therapy in order to address deficits listed below.      Follow Up Recommendations  SNF;Supervision - Intermittent;Supervision for mobility/OOB     Equipment Recommendations  Other (comment) (to be determined)    Recommendations for Other Services OT consult     Precautions / Restrictions Precautions Precautions: Fall Restrictions Weight Bearing Restrictions: No    Mobility  Bed Mobility Overal bed mobility: Needs Assistance Bed Mobility: Supine to Sit;Sit to Supine     Supine to sit: HOB elevated;Min guard Sit to supine: Min guard   General bed mobility comments: Pt was able to perform bed mobility with increased time given, and use of CGA for safety.    Transfers Overall transfer level: Needs assistance Equipment used: Rolling walker (2 wheeled) Transfers: Sit to/from Stand Sit to Stand: From elevated surface;Min assist         General transfer comment: Pt required 2 attempts due to the first attempt being unstable and required controlled descent back to bed.  Ambulation/Gait Ambulation/Gait assistance: Min guard Gait Distance (Feet): 12 Feet Assistive device: Rolling walker (2 wheeled)   Gait  velocity: decreased   General Gait Details: Pt was able to ambulate towards door, but felt weak and claimed he felt as though he was going to pass out.  he was able to remain upright and ambulate back to the bed however.   Stairs             Wheelchair Mobility    Modified Rankin (Stroke Patients Only)       Balance Overall balance assessment: Needs assistance Sitting-balance support: Feet supported Sitting balance-Leahy Scale: Fair Sitting balance -  Comments: Fair seated balance with BUE support   Standing balance support: Bilateral upper extremity supported;During functional activity Standing balance-Leahy Scale: Fair Standing balance comment: Good balance with FWW.                            Cognition Arousal/Alertness: Lethargic Behavior During Therapy: Flat affect Overall Cognitive Status: Within Functional Limits for tasks assessed                               Problem Solving: Requires tactile cues;Requires verbal cues General Comments: Pt performing much better during today's session, more aware of surroundiong and able to open his eyes once he washed them with washcloth given by therapist.      Exercises General Exercises - Lower Extremity Ankle Circles/Pumps: AROM;Strengthening;Both;10 reps;Supine Quad Sets: AROM;Strengthening;Both;10 reps;Supine Gluteal Sets: AROM;Strengthening;Both;10 reps;Supine Heel Slides: AROM;Strengthening;Both;10 reps;Supine Hip ABduction/ADduction: AROM;Strengthening;Both;10 reps;Supine Straight Leg Raises: AROM;Strengthening;Both;10 reps;Supine    General Comments        Pertinent Vitals/Pain Pain Assessment: No/denies pain    Home Living                      Prior Function            PT Goals (current goals can now be found in the care plan section) Acute Rehab PT Goals Patient Stated Goal: to get better PT Goal Formulation: With family Time For Goal Achievement: 11/04/20 Potential to Achieve Goals: Fair Progress towards PT goals: Progressing toward goals    Frequency    Min 2X/week      PT Plan Current plan remains appropriate    Co-evaluation              AM-PAC PT "6 Clicks" Mobility   Outcome Measure  Help needed turning from your back to your side while in a flat bed without using bedrails?: A Little Help needed moving from lying on your back to sitting on the side of a flat bed without using bedrails?: A Little Help  needed moving to and from a bed to a chair (including a wheelchair)?: A Lot Help needed standing up from a chair using your arms (e.g., wheelchair or bedside chair)?: A Little Help needed to walk in hospital room?: A Lot Help needed climbing 3-5 steps with a railing? : Total 6 Click Score: 14    End of Session Equipment Utilized During Treatment: Gait belt Activity Tolerance: Patient limited by fatigue Patient left: in bed;with call bell/phone within reach;with bed alarm set;with family/visitor present;with restraints reapplied Nurse Communication: Mobility status PT Visit Diagnosis: Muscle weakness (generalized) (M62.81);Other abnormalities of gait and mobility (R26.89)     Time: 0347-4259 PT Time Calculation (min) (ACUTE ONLY): 46 min  Charges:  $Gait Training: 23-37 mins $Therapeutic Exercise: 23-37 mins  Gwenlyn Saran, PT, DPT 10/24/20, 5:32 PM

## 2020-10-24 NOTE — Progress Notes (Signed)
PROGRESS NOTE    Joshua George  RCV:[id number] DOB: 12-03-1940 DOA: 10/20/2020 PCP: Kandyce Rud, MD   Brief Narrative: Taken from H&P. Joshua George is a 80 y.o. male with medical history significant for Parkinson's disease, GERD, BPH and dementia who presents to the ER by EMS for evaluation of progressively worsening generalized weakness requiring a 1 person assist which is unusual for the patient.  Poor p.o. intake and worsening generalized weakness over the past 1 to 2 weeks. Recently treated for a presumed UTI. Patient was found to have AKI and urinary retention.  Foley with more than 2 L of urine, AKI started improving with Foley.  Patient has slurred speech and dysarthria since his surgery on lower jaw when part of his jaw and teeth were removed for some growth per wife. Imaging with right-sided pleural effusion s/p thoracentesis and initial labs consistent with transudate. CT abdomen with bilateral nephrolithiasis and right hydronephrosis with prominent left perinephric edema. Urology was also consulted and they are recommending continue with Foley and outpatient follow-up in 2 weeks.  Patient developed fever yesterday afternoon along with increased lethargy and decreased responsiveness.  Procalcitonin positive at 0.31>>0.16, blood cultures remain negative pending repeat urine cultures.  Subjective: Patient clinically improved today.  Able to eat breakfast.  Responding appropriately.  Wife at bedside.  Remained afebrile over the past 24-hour.  Assessment & Plan:   Principal Problem:   AKI (acute kidney injury) (HCC) Active Problems:   Hyponatremia   CAP (community acquired pneumonia)   Parapneumonic effusion   Parkinson's disease (HCC)   GERD (gastroesophageal reflux disease)  Fever.  Remained afebrile over the past 24-hour.  Patient developed fever 10/22/20 afternoon with maximum temperature recorded was 101.2.  Becomes more lethargic.  CBC with persistent leukocytosis at 12.3  which seems little improved. Initial urine, pleural and blood cultures were negative. Chest x-ray without any obvious infiltrate but procalcitonin at 0.31>>0.16 Repeat blood cultures remain negative, urine cultures pending. -Follow-up procalcitonin. -Continue with current antibiotics at this time.  AKI. Resolved Significant improvement in creatinine after Foley placement.  Most likely secondary to urinary obstruction.  Baseline around 1 -Continue to monitor -Avoid nephrotoxins -Patient will be discharged with Foley catheter and will follow up with urology as an outpatient.  Pleural effusion.  Patient with bilateral pleural effusions, left more than right with some concern of left lower lobe collapse.  Patient with leukocytosis with left shift.  No prior history of heart failure. S/p thoracentesis with removal of about 1650 mL of fluid, initial labs appears transudate, cultures negative. -Repeat chest x-ray with small posterior pleural effusions and some interstitial congestion.  No obvious infiltrate. -Echocardiogram done-normal EF, no wall motion abnormalities and mild to moderate valvular regurgitations.  Hypernatremia.  Some improvement in sodium, at 144 -Continue D5 at 50cc/h for maintenance. -Encourage p.o. hydration if able to take. -Continue to monitor  Parkinsonism with worsening generalized weakness.  May be disease progression.  PT is recommending SNF placement. -TOC consult for placement. -Continue home meds   Objective: Vitals:   10/24/20 0432 10/24/20 0654 10/24/20 0757 10/24/20 0845  BP: (!) 166/85 (!) 168/88 (!) 170/93 (!) 157/78  Pulse: 72 69 65 67  Resp: 18 18 14 15   Temp: 98.4 F (36.9 C) 97.6 F (36.4 C) 97.7 F (36.5 C) 98.6 F (37 C)  TempSrc: Oral Oral Oral   SpO2: 98% 98% 99% 100%  Weight:      Height:        Intake/Output Summary (Last  24 hours) at 10/24/2020 1350 Last data filed at 10/24/2020 1036 Gross per 24 hour  Intake 360 ml  Output 225 ml   Net 135 ml   Filed Weights   10/22/20 0300 10/23/20 0100 10/24/20 0300  Weight: 60 kg 61.9 kg 59 kg    Examination:  General.  Frail elderly gentleman, in no acute distress. Pulmonary.  Lungs clear bilaterally, normal respiratory effort. CV.  Regular rate and rhythm, no JVD, rub or murmur. Abdomen.  Soft, nontender, nondistended, BS positive. CNS.  Alert and oriented.  No focal neurologic deficit. Extremities.  No edema, no cyanosis, pulses intact and symmetrical. Psychiatry.  Judgment and insight appears normal.  DVT prophylaxis: Heparin Code Status: DNR Family Communication: Wife was updated at bedside Disposition Plan:  Status is: Inpatient  Remains inpatient appropriate because:Inpatient level of care appropriate due to severity of illness   Dispo: The patient is from: Home              Anticipated d/c is to: SNF              Anticipated d/c date is: 2 day.              Patient currently is not medically stable.   Difficult to place patient Yes              Level of care: Med-Surg  Consultants:   Urology  Nephrology  Procedures:  Antimicrobials:  Zithromax Ceftriaxone  Data Reviewed: I have personally reviewed following labs and imaging studies  CBC: Recent Labs  Lab 10/20/20 0848 10/21/20 0502 10/22/20 0604 10/23/20 0904  WBC 13.7* 12.3* 12.6* 12.3*  NEUTROABS 11.5*  --   --   --   HGB 14.0 13.4 12.5* 13.3  HCT 40.8 38.5* 37.7* 39.7  MCV 87.7 88.9 90.6 91.3  PLT 186 182 201 195   Basic Metabolic Panel: Recent Labs  Lab 10/20/20 0848 10/21/20 0502 10/22/20 0604 10/23/20 0415 10/23/20 0904 10/24/20 0804  NA 129* 145 148* 148*  --  147*  K 5.1 3.8 3.5 3.4*  --  3.5  CL 91* 111 114* 111  --  109  CO2 22 21* 24 26  --  27  GLUCOSE 92 87 100* 91  --  100*  BUN 118* 38* 19 19  --  24*  CREATININE 8.87* 1.80* 0.89 0.85  --  0.92  CALCIUM 8.8* 8.1* 7.7* 7.8*  --  7.9*  MG  --   --   --   --  2.1  --    GFR: Estimated Creatinine Clearance:  55.2 mL/min (by C-G formula based on SCr of 0.92 mg/dL). Liver Function Tests: Recent Labs  Lab 10/20/20 0848  AST 14*  ALT <5  ALKPHOS 91  BILITOT 1.4*  PROT 7.1  ALBUMIN 3.3*   No results for input(s): LIPASE, AMYLASE in the last 168 hours. No results for input(s): AMMONIA in the last 168 hours. Coagulation Profile: No results for input(s): INR, PROTIME in the last 168 hours. Cardiac Enzymes: No results for input(s): CKTOTAL, CKMB, CKMBINDEX, TROPONINI in the last 168 hours. BNP (last 3 results) No results for input(s): PROBNP in the last 8760 hours. HbA1C: No results for input(s): HGBA1C in the last 72 hours. CBG: No results for input(s): GLUCAP in the last 168 hours. Lipid Profile: No results for input(s): CHOL, HDL, LDLCALC, TRIG, CHOLHDL, LDLDIRECT in the last 72 hours. Thyroid Function Tests: No results for input(s): TSH, T4TOTAL, FREET4, T3FREE, THYROIDAB in  the last 72 hours. Anemia Panel: No results for input(s): VITAMINB12, FOLATE, FERRITIN, TIBC, IRON, RETICCTPCT in the last 72 hours. Sepsis Labs: Recent Labs  Lab 10/20/20 0848 10/20/20 1038 10/22/20 0604 10/24/20 0804  PROCALCITON  --   --  0.31 0.16  LATICACIDVEN 1.3 1.3  --   --     Recent Results (from the past 240 hour(s))  Resp Panel by RT-PCR (Flu A&B, Covid) Nasopharyngeal Swab     Status: None   Collection Time: 10/20/20  8:49 AM   Specimen: Nasopharyngeal Swab; Nasopharyngeal(NP) swabs in vial transport medium  Result Value Ref Range Status   SARS Coronavirus 2 by RT PCR NEGATIVE NEGATIVE Final    Comment: (NOTE) SARS-CoV-2 target nucleic acids are NOT DETECTED.  The SARS-CoV-2 RNA is generally detectable in upper respiratory specimens during the acute phase of infection. The lowest concentration of SARS-CoV-2 viral copies this assay can detect is 138 copies/mL. A negative result does not preclude SARS-Cov-2 infection and should not be used as the sole basis for treatment or other patient  management decisions. A negative result may occur with  improper specimen collection/handling, submission of specimen other than nasopharyngeal swab, presence of viral mutation(s) within the areas targeted by this assay, and inadequate number of viral copies(<138 copies/mL). A negative result must be combined with clinical observations, patient history, and epidemiological information. The expected result is Negative.  Fact Sheet for Patients:  BloggerCourse.com  Fact Sheet for Healthcare Providers:  SeriousBroker.it  This test is no t yet approved or cleared by the [place] FDA and  has been authorized for detection and/or diagnosis of SARS-CoV-2 by FDA under an Emergency Use Authorization (EUA). This EUA will remain  in effect (meaning this test can be used) for the duration of the COVID-19 declaration under Section 564(b)(1) of the Act, 21 U.S.C.section 360bbb-3(b)(1), unless the authorization is terminated  or revoked sooner.       Influenza A by PCR NEGATIVE NEGATIVE Final   Influenza B by PCR NEGATIVE NEGATIVE Final    Comment: (NOTE) The Xpert Xpress SARS-CoV-2/FLU/RSV plus assay is intended as an aid in the diagnosis of influenza from Nasopharyngeal swab specimens and should not be used as a sole basis for treatment. Nasal washings and aspirates are unacceptable for Xpert Xpress SARS-CoV-2/FLU/RSV testing.  Fact Sheet for Patients: BloggerCourse.com  Fact Sheet for Healthcare Providers: SeriousBroker.it  This test is not yet approved or cleared by the [place] FDA and has been authorized for detection and/or diagnosis of SARS-CoV-2 by FDA under an Emergency Use Authorization (EUA). This EUA will remain in effect (meaning this test can be used) for the duration of the COVID-19 declaration under Section 564(b)(1) of the Act, 21 U.S.C. section 360bbb-3(b)(1),  unless the authorization is terminated or revoked.  Performed at [facility], [address], [place], [place] [address]   Urine culture     Status: None   Collection Time: 10/20/20 11:35 AM   Specimen: Urine, Random  Result Value Ref Range Status   Specimen Description   Final    URINE, RANDOM Performed at [facility], [address], [place], [place] [address]    Special Requests   Final    NONE Performed at [facility] [address], [place], [place] [address]    Culture   Final    NO GROWTH Performed at [facility], [address] [place], [place] [address]    Report Status 10/21/2020 FINAL  Final  Body fluid culture     Status: None   Collection Time: 10/20/20  1:30 PM   Specimen: PATH Cytology Pleural fluid  Result Value Ref Range Status   Specimen Description   Final    PLEURAL Performed at [facility], [address], [place], [place] [address]    Special Requests   Final    NONE Performed at [facility], [address] [place], [place] [address]    Gram Stain   Final    MODERATE WBC PRESENT,BOTH PMN AND MONONUCLEAR NO ORGANISMS SEEN    Culture   Final    NO GROWTH 3 DAYS Performed at [facility], [address], [place], [place] [address]    Report Status 10/23/2020 FINAL  Final  CULTURE, BLOOD (ROUTINE X 2) w Reflex to ID Panel     Status: None (Preliminary result)   Collection Time: 10/23/20  8:51 AM   Specimen: BLOOD  Result Value Ref Range Status   Specimen Description BLOOD BLOOD LEFT ARM  Final   Special Requests   Final    BOTTLES DRAWN AEROBIC AND ANAEROBIC Blood Culture results may not be optimal due to an excessive volume of blood received in culture bottles   Culture   Final    NO GROWTH < 24 HOURS Performed at [facility], [address] [place], [place] [address]    Report Status PENDING  Incomplete  CULTURE, BLOOD (ROUTINE X 2) w Reflex to ID Panel      Status: None (Preliminary result)   Collection Time: 10/23/20  8:58 AM   Specimen: BLOOD  Result Value Ref Range Status   Specimen Description BLOOD LEFT ANTECUBITAL  Final   Special Requests   Final    BOTTLES DRAWN AEROBIC AND ANAEROBIC Blood Culture adequate volume   Culture   Final    NO GROWTH < 24 HOURS Performed at [facility], [address], [place], [place] [address]    Report Status PENDING  Incomplete     Radiology Studies: No results found.  Scheduled Meds: . amantadine  100 mg Oral BID  . carbidopa-levodopa  1.5 tablet Oral QID  . Chlorhexidine Gluconate Cloth  6 each Topical Daily  . clonazePAM  1 mg Oral QHS  . entacapone  200 mg Oral QID  . feeding supplement  237 mL Oral BID BM  . heparin  5,000 Units Subcutaneous Q8H  . mouth rinse  15 mL Mouth Rinse BID  . polyethylene glycol  17 g Oral Daily  . rasagiline  1 mg Oral Daily  . sodium chloride flush  3 mL Intravenous Q12H   Continuous Infusions: . sodium chloride    . azithromycin 500 mg (10/24/20 1307)  . cefTRIAXone (ROCEPHIN)  IV 2 g (10/24/20 1121)     LOS: 4 days   Time spent: 25 minutes.  Arnetha Courser, MD [facility]  If 7PM-7AM, please contact night-coverage [email or web address]  10/24/2020, 1:50 PM   This record has been created using Conservation officer, historic buildings. Errors have been sought and corrected,but may not always be located. Such creation errors do not reflect on the standard of care.

## 2020-10-25 LAB — CBC
HCT: 39 % (ref 39.0–52.0)
Hemoglobin: 12.5 g/dL — ABNORMAL LOW (ref 13.0–17.0)
MCH: 30 pg (ref 26.0–34.0)
MCHC: 32.1 g/dL (ref 30.0–36.0)
MCV: 93.5 fL (ref 80.0–100.0)
Platelets: 186 10*3/uL (ref 150–400)
RBC: 4.17 MIL/uL — ABNORMAL LOW (ref 4.22–5.81)
RDW: 12.6 % (ref 11.5–15.5)
WBC: 13.3 10*3/uL — ABNORMAL HIGH (ref 4.0–10.5)
nRBC: 0 % (ref 0.0–0.2)

## 2020-10-25 LAB — URINE CULTURE: Culture: NO GROWTH

## 2020-10-25 LAB — PROCALCITONIN: Procalcitonin: <0.1 ng/mL

## 2020-10-25 NOTE — Evaluation (Signed)
Occupational Therapy Evaluation Patient Details Name: Joshua George MRN: [id number] DOB: 1941/03/27 Today's Date: 10/25/2020    History of Present Illness Pt is a 80yo M admitted to [facility] on 10/20/20 for c/o increased weakness, confusion, agitation, and slurred speech. Spouse notes decreased oral intake and generalized weakness. Significant PMH includes: PD and dementia. Pt notes that he  has been on/off of his PD medications for about a week, but resumed medication a few days ago. Pt dx with UTI last week and is currently being treated with antibiotic. Pt s/p L thoracentesis yielding of yellow fluid. Imaging revealed large L sided pleural effusion with underlying atelectasis/infiltrate and small R sided pleural effusion.   Clinical Impression   Pt seen for OT treatment on this date. Upon arrival to room pt in bed asleep, however easily awoken and agreeable to evaluation. Wife present at bedside. Pt A&O x 2, reporting no pain. Prior to admission, wife reports that pt was independent with functional mobility, ADL's (with the exception of fastening buttons/suspenders), and IADL's. Over past 3 weeks, wife reports that pt has become progressively weaker, requiring assistance for short distance ambulation with SPC, transfers, ADL's, and IADL's. This date, pt required MIN GUARD for bed mobility, intermittent MIN A for dynamic sitting balance at EOB, MOD A for seated LB dressing, and MIN A for standing grooming tasks with unilateral UE support from RW. Pt would benefit from continued skilled OT services to maximize return to PLOF and minimize risk of future falls, injury, caregiver burden, and readmission. Discharge recommendation: SNF.     Follow Up Recommendations  SNF    Equipment Recommendations  3 in 1 bedside commode       Precautions / Restrictions Precautions Precautions: Fall Restrictions Weight Bearing Restrictions: No      Mobility Bed Mobility Overal bed mobility: Needs  Assistance Bed Mobility: Supine to Sit;Sit to Supine     Supine to sit: HOB elevated;Min guard Sit to supine: Min guard   General bed mobility comments: Pt was able to perform bed mobility with increased time given, and use of CGA for safety    Transfers Overall transfer level: Needs assistance Equipment used: Rolling walker (2 wheeled) Transfers: Sit to/from Stand Sit to Stand: Min assist         General transfer comment: Able to clear buttocks from bed upon first attempt. required cues for hand placement    Balance Overall balance assessment: Needs assistance Sitting-balance support: No upper extremity supported;Feet supported Sitting balance-Leahy Scale: Poor Sitting balance - Comments: Required intermittent MIN A in setting of posterior lean during LB dressing Postural control: Posterior lean Standing balance support: Single extremity supported;During functional activity Standing balance-Leahy Scale: Poor Standing balance comment: Required MIN A during standing grooming tasks                           ADL either performed or assessed with clinical judgement   ADL Overall ADL's : Needs assistance/impaired     Grooming: Wash/dry hands;Wash/dry face;Brushing hair;Minimal assistance;Standing Grooming Details (indicate cue type and reason): Able to complete grooming tasks at sink-side, requiring unilateral UE support on RW and MIN A for steadying in setting of posterior lean             Lower Body Dressing: Moderate assistance;Sitting/lateral leans Lower Body Dressing Details (indicate cue type and reason): To don/doff socks. Pt with good effort (attempted with 3 different body positions), requiring increased time and effort  Functional mobility during ADLs: Moderate assistance;Rolling walker (~5 small shuffled steps forward/backwards)       Vision Patient Visual Report: No change from baseline (Intermittent diplopia at baseline)               Pertinent Vitals/Pain Pain Assessment: No/denies pain        Extremity/Trunk Assessment Upper Extremity Assessment Upper Extremity Assessment: Overall WFL for tasks assessed   Lower Extremity Assessment Lower Extremity Assessment: Overall WFL for tasks assessed   Cervical / Trunk Assessment Cervical / Trunk Assessment: Other exceptions Cervical / Trunk Exceptions: Scoliosis   Communication Communication Communication: Expressive difficulties   Cognition Arousal/Alertness: Lethargic Behavior During Therapy: Flat affect Overall Cognitive Status: Within Functional Limits for tasks assessed Area of Impairment: Awareness;Problem solving;Orientation;Following commands                 Orientation Level: Situation;Time Current Attention Level: Selective Memory: Decreased short-term memory Following Commands: Follows one step commands consistently Safety/Judgement: Decreased awareness of safety;Decreased awareness of deficits Awareness: Intellectual Problem Solving: Requires tactile cues;Requires verbal cues;Slow processing General Comments: Pt able to follow one-step commands consistently, and sequence tasks with increased time. Increased alertness once sitting EOB      Exercises General Exercises - Upper Extremity Shoulder Flexion: AROM;Strengthening;Both;10 reps;Supine Elbow Flexion: AROM;Strengthening;Both;10 reps;Supine General Exercises - Lower Extremity Ankle Circles/Pumps: AROM;Strengthening;Both;10 reps;Supine Heel Slides: AROM;Strengthening;Both;10 reps;Supine        Home Living Family/patient expects to be discharged to:: Private residence Living Arrangements: Spouse/significant other Available Help at Discharge: Family;Available PRN/intermittently (Spouse able to provide 24/7 supervision; other family able to provide PRN assistance/supervision) Type of Home: House Home Access: Ramped entrance     Home Layout: One level     Bathroom  Shower/Tub: Producer, television/film/video: Standard Bathroom Accessibility: Yes   Home Equipment: Cane - single point;Grab bars - toilet          Prior Functioning/Environment Level of Independence: Independent        Comments: Pt with progressive onset of symptoms over past 3 weeks. Prior to symptoms, spouse reports that pt was Ind with community ambulation, ADL's (with the exception of fastening buttons/suspenders), and IADL's. Over past 3 weeks pt has become progressively weaker, requiring assistance for short distance ambulation with SPC, transfers, ADL's, and IADL's. Pt does not drive.        OT Problem List: Decreased strength;Decreased activity tolerance;Impaired balance (sitting and/or standing);Decreased coordination;Decreased cognition      OT Treatment/Interventions: Self-care/ADL training;Therapeutic exercise;Energy conservation;DME and/or AE instruction;Therapeutic activities;Patient/family education;Balance training    OT Goals(Current goals can be found in the care plan section) Acute Rehab OT Goals Patient Stated Goal: to get better OT Goal Formulation: With patient/family Time For Goal Achievement: 11/08/20 Potential to Achieve Goals: Good ADL Goals Pt Will Perform Grooming: with supervision;standing Pt Will Perform Lower Body Dressing: with supervision;sit to/from stand Pt Will Transfer to Toilet: with supervision;stand pivot transfer;bedside commode  OT Frequency: Min 2X/week    AM-PAC OT "6 Clicks" Daily Activity     Outcome Measure Help from another person eating meals?: None Help from another person taking care of personal grooming?: A Little Help from another person toileting, which includes using toliet, bedpan, or urinal?: A Lot Help from another person bathing (including washing, rinsing, drying)?: A Lot Help from another person to put on and taking off regular upper body clothing?: A Little Help from another person to put on and taking off  regular lower body clothing?: A Lot 6  Click Score: 16   End of Session Equipment Utilized During Treatment: Gait belt;Rolling walker Nurse Communication: Mobility status  Activity Tolerance: Patient tolerated treatment well Patient left: in bed;with call bell/phone within reach;with bed alarm set;with family/visitor present  OT Visit Diagnosis: Unsteadiness on feet (R26.81);Muscle weakness (generalized) (M62.81)                Time: 3578-9784 OT Time Calculation (min): 35 min Charges:  OT General Charges $OT Visit: 1 Visit OT Evaluation $OT Eval Moderate Complexity: 1 Mod OT Treatments $Self Care/Home Management : 8-22 mins $Therapeutic Activity: 8-22 mins  Matthew Folks, OTR/L [facility] [phone]

## 2020-10-25 NOTE — Progress Notes (Signed)
PROGRESS NOTE    Joshua George  BJY:[id number] DOB: [birth year] DOA: 10/20/2020 PCP: Kandyce Rud, MD   Brief Narrative: Taken from H&P. Joshua George is a 80 y.o. male with medical history significant for Parkinson's disease, GERD, BPH and dementia who presents to the ER by EMS for evaluation of progressively worsening generalized weakness requiring a 1 person assist which is unusual for the patient.  Poor p.o. intake and worsening generalized weakness over the past 1 to 2 weeks. Recently treated for a presumed UTI. Patient was found to have AKI and urinary retention.  Foley with more than 2 L of urine, AKI started improving with Foley.  Patient has slurred speech and dysarthria since his surgery on lower jaw when part of his jaw and teeth were removed for some growth per wife. Imaging with right-sided pleural effusion s/p thoracentesis and initial labs consistent with transudate. CT abdomen with bilateral nephrolithiasis and right hydronephrosis with prominent left perinephric edema. Urology was also consulted and they are recommending continue with Foley and outpatient follow-up in 2 weeks.  Patient developed fever yesterday afternoon along with increased lethargy and decreased responsiveness.  Procalcitonin positive at 0.31>>0.16, blood cultures remain negative pending repeat urine cultures.  Subjective: Patient seems improving.  Mentation much improved, sitting upright in bed, per wife he ate good breakfast.  Able to communicate. There is some concern of coughing with drinking water. Waiting for SNF placement.  Assessment & Plan:   Principal Problem:   AKI (acute kidney injury) (HCC) Active Problems:   Hyponatremia   CAP (community acquired pneumonia)   Parapneumonic effusion   Parkinson's disease (HCC)   GERD (gastroesophageal reflux disease)  Fever.  Remained afebrile over the past 48-hour.  Patient developed fever 10/22/20 afternoon with maximum temperature recorded was 101.2.   Becomes more lethargic.  CBC with persistent leukocytosis, procalcitonin has been normalized and repeat urine and blood cultures remain negative.. Chest x-ray without any obvious infiltrate  -Continue with current antibiotics at this time, to complete a 5-day course.  Concern of coughing with drinking. -We will get formal swallow evaluation.  AKI. Resolved Significant improvement in creatinine after Foley placement.  Most likely secondary to urinary obstruction.  Baseline around 1 -Continue to monitor -Avoid nephrotoxins -Patient will be discharged with Foley catheter and will follow up with urology as an outpatient.  Pleural effusion.  Patient with bilateral pleural effusions, left more than right with some concern of left lower lobe collapse.  Patient with leukocytosis with left shift.  No prior history of heart failure. S/p thoracentesis with removal of about 1650 mL of fluid, initial labs appears transudate, cultures negative. -Repeat chest x-ray with small posterior pleural effusions and some interstitial congestion.  No obvious infiltrate. -Echocardiogram done-normal EF, no wall motion abnormalities and mild to moderate valvular regurgitations.  Hypernatremia.  Improving -Encourage p.o. hydration if able to take. -Continue to monitor  Parkinsonism with worsening generalized weakness.  May be disease progression.  PT is recommending SNF placement. -TOC consult for placement. -Continue home meds  Protein caloric malnutrition. Body mass index is 19.77 kg/m. -Dietitian consult.   Objective: Vitals:   10/25/20 0013 10/25/20 0516 10/25/20 0745 10/25/20 1119  BP: (!) 163/86 (!) 150/84 (!) 167/86 125/77  Pulse: 64 67 68 71  Resp: Temp: 98.4 F (36.9 C) 98.6 F (37 C) 98 F (36.7 C) 98.2 F (36.8 C)  TempSrc:  Oral Oral Oral  SpO2: 92% 92% 92% 93%  Weight:  Height:        Intake/Output Summary (Last 24 hours) at 10/25/2020 1345 Last data filed at  10/25/2020 1036 Gross per 24 hour  Intake 1310 ml  Output 1400 ml  Net -90 ml   Filed Weights   10/22/20 0300 10/23/20 0100 10/24/20 0300  Weight: 60 kg 61.9 kg 59 kg    Examination:  General.  Frail elderly man, in no acute distress. Pulmonary.  Lungs clear bilaterally, normal respiratory effort. CV.  Regular rate and rhythm, no JVD, rub or murmur. Abdomen.  Soft, nontender, nondistended, BS positive. CNS.  Alert and oriented x3.  No focal neurologic deficit. Extremities.  No edema, no cyanosis, pulses intact and symmetrical. Psychiatry.  Judgment and insight appears normal.  DVT prophylaxis: Heparin Code Status: DNR Family Communication: Wife was updated at bedside Disposition Plan:  Status is: Inpatient  Remains inpatient appropriate because:Inpatient level of care appropriate due to severity of illness   Dispo: The patient is from: Home              Anticipated d/c is to: SNF              Anticipated d/c date is: 2 day.              Patient currently is medically stable.   Difficult to place patient Yes              Level of care: Med-Surg  Consultants:   Urology  Nephrology  Procedures:  Antimicrobials:  Zithromax Ceftriaxone  Data Reviewed: I have personally reviewed following labs and imaging studies  CBC: Recent Labs  Lab 10/20/20 0848 10/21/20 0502 10/22/20 0604 10/23/20 0904 10/25/20 0449  WBC 13.7* 12.3* 12.6* 12.3* 13.3*  NEUTROABS 11.5*  --   --   --   --   HGB 14.0 13.4 12.5* 13.3 12.5*  HCT 40.8 38.5* 37.7* 39.7 39.0  MCV 87.7 88.9 90.6 91.3 93.5  PLT 186 182 201 195 186   Basic Metabolic Panel: Recent Labs  Lab 10/20/20 0848 10/21/20 0502 10/22/20 0604 10/23/20 0415 10/23/20 0904 10/24/20 0804  NA 129* 145 148* 148*  --  147*  K 5.1 3.8 3.5 3.4*  --  3.5  CL 91* 111 114* 111  --  109  CO2 22 21* 24 26  --  27  GLUCOSE 92 87 100* 91  --  100*  BUN 118* 38* 19 19  --  24*  CREATININE 8.87* 1.80* 0.89 0.85  --  0.92  CALCIUM  8.8* 8.1* 7.7* 7.8*  --  7.9*  MG  --   --   --   --  2.1  --    GFR: Estimated Creatinine Clearance: 55.2 mL/min (by C-G formula based on SCr of 0.92 mg/dL). Liver Function Tests: Recent Labs  Lab 10/20/20 0848  AST 14*  ALT <5  ALKPHOS 91  BILITOT 1.4*  PROT 7.1  ALBUMIN 3.3*   No results for input(s): LIPASE, AMYLASE in the last 168 hours. No results for input(s): AMMONIA in the last 168 hours. Coagulation Profile: No results for input(s): INR, PROTIME in the last 168 hours. Cardiac Enzymes: No results for input(s): CKTOTAL, CKMB, CKMBINDEX, TROPONINI in the last 168 hours. BNP (last 3 results) No results for input(s): PROBNP in the last 8760 hours. HbA1C: No results for input(s): HGBA1C in the last 72 hours. CBG: No results for input(s): GLUCAP in the last 168 hours. Lipid Profile: No results for input(s): CHOL, HDL, LDLCALC,  TRIG, CHOLHDL, LDLDIRECT in the last 72 hours. Thyroid Function Tests: No results for input(s): TSH, T4TOTAL, FREET4, T3FREE, THYROIDAB in the last 72 hours. Anemia Panel: No results for input(s): VITAMINB12, FOLATE, FERRITIN, TIBC, IRON, RETICCTPCT in the last 72 hours. Sepsis Labs: Recent Labs  Lab 10/20/20 0848 10/20/20 1038 10/22/20 0604 10/24/20 0804 10/25/20 0449  PROCALCITON  --   --  0.31 0.16 <0.10  LATICACIDVEN 1.3 1.3  --   --   --     Recent Results (from the past 240 hour(s))  Resp Panel by RT-PCR (Flu A&B, Covid) Nasopharyngeal Swab     Status: None   Collection Time: 10/20/20  8:49 AM   Specimen: Nasopharyngeal Swab; Nasopharyngeal(NP) swabs in vial transport medium  Result Value Ref Range Status   SARS Coronavirus 2 by RT PCR NEGATIVE NEGATIVE Final    Comment: (NOTE) SARS-CoV-2 target nucleic acids are NOT DETECTED.  The SARS-CoV-2 RNA is generally detectable in upper respiratory specimens during the acute phase of infection. The lowest concentration of SARS-CoV-2 viral copies this assay can detect is 138 copies/mL.  A negative result does not preclude SARS-Cov-2 infection and should not be used as the sole basis for treatment or other patient management decisions. A negative result may occur with  improper specimen collection/handling, submission of specimen other than nasopharyngeal swab, presence of viral mutation(s) within the areas targeted by this assay, and inadequate number of viral copies(<138 copies/mL). A negative result must be combined with clinical observations, patient history, and epidemiological information. The expected result is Negative.  Fact Sheet for Patients:  BloggerCourse.comhttps://www.fda.gov/media/152166/download  Fact Sheet for Healthcare Providers:  SeriousBroker.ithttps://www.fda.gov/media/152162/download  This test is no t yet approved or cleared by the [place] FDA and  has been authorized for detection and/or diagnosis of SARS-CoV-2 by FDA under an Emergency Use Authorization (EUA). This EUA will remain  in effect (meaning this test can be used) for the duration of the COVID-19 declaration under Section 564(b)(1) of the Act, 21 U.S.C.section 360bbb-3(b)(1), unless the authorization is terminated  or revoked sooner.       Influenza A by PCR NEGATIVE NEGATIVE Final   Influenza B by PCR NEGATIVE NEGATIVE Final    Comment: (NOTE) The Xpert Xpress SARS-CoV-2/FLU/RSV plus assay is intended as an aid in the diagnosis of influenza from Nasopharyngeal swab specimens and should not be used as a sole basis for treatment. Nasal washings and aspirates are unacceptable for Xpert Xpress SARS-CoV-2/FLU/RSV testing.  Fact Sheet for Patients: BloggerCourse.comhttps://www.fda.gov/media/152166/download  Fact Sheet for Healthcare Providers: SeriousBroker.ithttps://www.fda.gov/media/152162/download  This test is not yet approved or cleared by the [place] FDA and has been authorized for detection and/or diagnosis of SARS-CoV-2 by FDA under an Emergency Use Authorization (EUA). This EUA will remain in effect (meaning this test  can be used) for the duration of the COVID-19 declaration under Section 564(b)(1) of the Act, 21 U.S.C. section 360bbb-3(b)(1), unless the authorization is terminated or revoked.  Performed at [facility], [address] [place], [place] [address]   Urine culture     Status: None   Collection Time: 10/20/20 11:35 AM   Specimen: Urine, Random  Result Value Ref Range Status   Specimen Description   Final    URINE, RANDOM Performed at [facility], [address] [place], [place] [address]    Special Requests   Final    NONE Performed at [facility], [address] [place], [place] [address]    Culture   Final  NO GROWTH Performed at [facility], [address] [place], [place] [address]    Report Status 10/21/2020 FINAL  Final  Body fluid culture     Status: None   Collection Time: 10/20/20  1:30 PM   Specimen: PATH Cytology Pleural fluid  Result Value Ref Range Status   Specimen Description   Final    PLEURAL Performed at [facility], [address] [place], [place] [address]    Special Requests   Final    NONE Performed at [facility], [address] [place], [place] [address]    Gram Stain   Final    MODERATE WBC PRESENT,BOTH PMN AND MONONUCLEAR NO ORGANISMS SEEN    Culture   Final    NO GROWTH 3 DAYS Performed at [facility], [address], [place], [place] [address]    Report Status 10/23/2020 FINAL  Final  CULTURE, BLOOD (ROUTINE X 2) w Reflex to ID Panel     Status: None (Preliminary result)   Collection Time: 10/23/20  8:51 AM   Specimen: BLOOD  Result Value Ref Range Status   Specimen Description BLOOD BLOOD LEFT ARM  Final   Special Requests   Final    BOTTLES DRAWN AEROBIC AND ANAEROBIC Blood Culture results may not be optimal due to an excessive volume of blood received in culture bottles   Culture   Final    NO GROWTH 2 DAYS Performed at [facility], [address], [place], [place] [address]    Report Status PENDING  Incomplete  CULTURE, BLOOD (ROUTINE X 2) w Reflex to ID Panel     Status: None (Preliminary result)   Collection Time: 10/23/20  8:58 AM   Specimen: BLOOD  Result Value Ref Range Status   Specimen Description BLOOD LEFT ANTECUBITAL  Final   Special Requests   Final    BOTTLES DRAWN AEROBIC AND ANAEROBIC Blood Culture adequate volume   Culture   Final    NO GROWTH 2 DAYS Performed at [facility], [address], [place], [place] [address]    Report Status PENDING  Incomplete  Urine Culture     Status: None   Collection Time: 10/23/20  2:00 PM   Specimen: Urine, Random  Result Value Ref Range Status   Specimen Description   Final    URINE, RANDOM Performed at [facility], [address] [place], [place] [address]    Special Requests   Final    NONE Performed at [facility], [address], [place], [place] [address]    Culture   Final    NO GROWTH Performed at [facility], [address] [place], [place] [address]    Report Status 10/25/2020 FINAL  Final     Radiology Studies: No results found.  Scheduled Meds: . amantadine  100 mg Oral BID  . carbidopa-levodopa  1.5 tablet Oral QID  . Chlorhexidine Gluconate Cloth  6 each Topical Daily  . clonazePAM  1 mg Oral QHS  . entacapone  200 mg Oral QID  . feeding supplement  237 mL Oral BID BM  . heparin  5,000 Units Subcutaneous Q8H  . mouth rinse  15 mL Mouth Rinse BID  . polyethylene glycol  17 g Oral Daily  . rasagiline  1 mg Oral Daily  . sodium chloride flush  3 mL Intravenous Q12H   Continuous Infusions: . sodium chloride    . azithromycin  500 mg (10/25/20 1238)  . cefTRIAXone (ROCEPHIN)  IV 2 g (10/25/20 1006)     LOS: 5 days   Time spent: 25 minutes.  Arnetha Courser, MD [facility]  If 7PM-7AM, please contact night-coverage [email or web address]  10/25/2020, 1:45 PM   This record has been created using  Conservation officer, historic buildings. Errors have been sought and corrected,but may not always be located. Such creation errors do not reflect on the standard of care.

## 2020-10-26 DIAGNOSIS — E43 Unspecified severe protein-calorie malnutrition: Secondary | ICD-10-CM | POA: Insufficient documentation

## 2020-10-26 LAB — PROCALCITONIN: Procalcitonin: <0.1 ng/mL

## 2020-10-26 LAB — CBC
HCT: 40.7 % (ref 39.0–52.0)
Hemoglobin: 13.7 g/dL (ref 13.0–17.0)
MCH: 31 pg (ref 26.0–34.0)
MCHC: 33.7 g/dL (ref 30.0–36.0)
MCV: 92.1 fL (ref 80.0–100.0)
Platelets: 185 10*3/uL (ref 150–400)
RBC: 4.42 MIL/uL (ref 4.22–5.81)
RDW: 12.5 % (ref 11.5–15.5)
WBC: 13.4 10*3/uL — ABNORMAL HIGH (ref 4.0–10.5)
nRBC: 0 % (ref 0.0–0.2)

## 2020-10-26 LAB — BASIC METABOLIC PANEL
Anion gap: 9 (ref 5–15)
BUN: 20 mg/dL (ref 8–23)
CO2: 26 mmol/L (ref 22–32)
Calcium: 7.8 mg/dL — ABNORMAL LOW (ref 8.9–10.3)
Chloride: 104 mmol/L (ref 98–111)
Creatinine, Ser: 0.75 mg/dL (ref 0.61–1.24)
GFR, Estimated: >60 mL/min (ref >60)
Glucose, Bld: 89 mg/dL (ref 70–99)
Potassium: 4 mmol/L (ref 3.5–5.1)
Sodium: 139 mmol/L (ref 135–145)

## 2020-10-26 MED ORDER — BISACODYL 10 MG RE SUPP: 10.0000 mg | Freq: Every day | RECTAL | Status: DC | PRN

## 2020-10-26 MED ORDER — ENSURE ENLIVE PO LIQD
237.0000 mL | Freq: Three times a day (TID) | ORAL | Status: DC
  Administered 2020-10-26 – 2020-10-28 (×5): 237 mL via ORAL

## 2020-10-26 MED ORDER — ADULT MULTIVITAMIN W/MINERALS CH
1.0000 | ORAL_TABLET | Freq: Every day | ORAL | Status: DC
  Administered 2020-10-27 – 2020-10-28 (×2): 1 via ORAL
  Filled 2020-10-26 (×2): qty 1

## 2020-10-26 NOTE — Progress Notes (Signed)
PROGRESS NOTE    Joshua George  ZOX:[id number] DOB: 1941-08-31 DOA: 10/20/2020 PCP: Kandyce Rud, MD   Brief Narrative: Taken from H&P. Joshua George is a 80 y.o. male with medical history significant for Parkinson's disease, GERD, BPH and dementia who presents to the ER by EMS for evaluation of progressively worsening generalized weakness requiring a 1 person assist which is unusual for the patient.  Poor p.o. intake and worsening generalized weakness over the past 1 to 2 weeks. Recently treated for a presumed UTI. Patient was found to have AKI and urinary retention.  Foley with more than 2 L of urine, AKI started improving with Foley.  Patient has slurred speech and dysarthria since his surgery on lower jaw when part of his jaw and teeth were removed for some growth per wife. Imaging with right-sided pleural effusion s/p thoracentesis and initial labs consistent with transudate. CT abdomen with bilateral nephrolithiasis and right hydronephrosis with prominent left perinephric edema. Urology was also consulted and they are recommending continue with Foley and outpatient follow-up in 2 weeks.  Patient developed fever yesterday afternoon along with increased lethargy and decreased responsiveness.  Procalcitonin positive at 0.31>>0.16, blood cultures remain negative pending repeat urine cultures.  Subjective: Patient was eating breakfast with the help of his wife and seen today.  No new concern.  Assessment & Plan:   Principal Problem:   AKI (acute kidney injury) (HCC) Active Problems:   Hyponatremia   CAP (community acquired pneumonia)   Parapneumonic effusion   Parkinson's disease (HCC)   GERD (gastroesophageal reflux disease)   Protein-calorie malnutrition, severe  Fever.  Remained afebrile   Patient developed fever 10/22/20 afternoon with maximum temperature recorded was 101.2.  Becomes more lethargic.  CBC with persistent leukocytosis, procalcitonin has been normalized and repeat  urine and blood cultures remain negative.. Chest x-ray without any obvious infiltrate  -Continue with current antibiotics at this time, to complete a 5-day course, will complete the course today.  Concern of coughing with drinking. -We will get formal swallow evaluation.  AKI. Resolved Significant improvement in creatinine after Foley placement.  Most likely secondary to urinary obstruction.  Baseline around 1 -Continue to monitor -Avoid nephrotoxins -Patient will be discharged with Foley catheter and will follow up with urology as an outpatient.  Pleural effusion.  Patient with bilateral pleural effusions, left more than right with some concern of left lower lobe collapse.  Patient with leukocytosis with left shift.  No prior history of heart failure. S/p thoracentesis with removal of about 1650 mL of fluid, initial labs appears transudate, cultures negative. -Repeat chest x-ray with small posterior pleural effusions and some interstitial congestion.  No obvious infiltrate. -Echocardiogram done-normal EF, no wall motion abnormalities and mild to moderate valvular regurgitations.  Hypernatremia.  Improving -Encourage p.o. hydration if able to take. -Continue to monitor  Parkinsonism with worsening generalized weakness.  May be disease progression.  PT is recommending SNF placement. -TOC consult for placement. -Continue home meds  Protein caloric malnutrition. Body mass index is 19.77 kg/m. -Dietitian consult.   Objective: Vitals:   10/26/20 0532 10/26/20 0829 10/26/20 1220 10/26/20 1502  BP: (!) 160/83 (!) 162/86 94/60 111/69  Pulse: 61 65 76 78  Resp: 18   16  Temp: 97.7 F (36.5 C) 97.6 F (36.4 C)  98.7 F (37.1 C)  TempSrc:  Oral    SpO2: 97% 91% 97% 95%  Weight:      Height:        Intake/Output Summary (Last 24  hours) at 10/26/2020 1627 Last data filed at 10/26/2020 1347 Gross per 24 hour  Intake 950 ml  Output 1550 ml  Net -600 ml   Filed Weights    10/22/20 0300 10/23/20 0100 10/24/20 0300  Weight: 60 kg 61.9 kg 59 kg    Examination:  General.  Frail elderly man, in no acute distress. Pulmonary.  Lungs clear bilaterally, normal respiratory effort. CV.  Regular rate and rhythm, no JVD, rub or murmur. Abdomen.  Soft, nontender, nondistended, BS positive. CNS.  Alert and oriented x3.  No focal neurologic deficit. Extremities.  No edema, no cyanosis, pulses intact and symmetrical. Psychiatry.  Judgment and insight appears normal.  DVT prophylaxis: Heparin Code Status: DNR Family Communication: Wife was updated at bedside Disposition Plan:  Status is: Inpatient  Remains inpatient appropriate because:Inpatient level of care appropriate due to severity of illness   Dispo: The patient is from: Home              Anticipated d/c is to: SNF              Anticipated d/c date is: 1 day.              Patient currently is medically stable.   Difficult to place patient Yes              Level of care: Med-Surg  Consultants:   Urology  Nephrology  Procedures:  Antimicrobials:  Zithromax Ceftriaxone  Data Reviewed: I have personally reviewed following labs and imaging studies  CBC: Recent Labs  Lab 10/20/20 0848 10/21/20 0502 10/22/20 0604 10/23/20 0904 10/25/20 0449 10/26/20 0542  WBC 13.7* 12.3* 12.6* 12.3* 13.3* 13.4*  NEUTROABS 11.5*  --   --   --   --   --   HGB 14.0 13.4 12.5* 13.3 12.5* 13.7  HCT 40.8 38.5* 37.7* 39.7 39.0 40.7  MCV 87.7 88.9 90.6 91.3 93.5 92.1  PLT 186 182 201 195 186 185   Basic Metabolic Panel: Recent Labs  Lab 10/21/20 0502 10/22/20 0604 10/23/20 0415 10/23/20 0904 10/24/20 0804 10/26/20 0542  NA 145 148* 148*  --  147* 139  K 3.8 3.5 3.4*  --  3.5 4.0  CL 111 114* 111  --  109 104  CO2 21* 24 26  --  27 26  GLUCOSE 87 100* 91  --  100* 89  BUN 38* 19 19  --  24* 20  CREATININE 1.80* 0.89 0.85  --  0.92 0.75  CALCIUM 8.1* 7.7* 7.8*  --  7.9* 7.8*  MG  --   --   --  2.1  --    --    GFR: Estimated Creatinine Clearance: 63.5 mL/min (by C-G formula based on SCr of 0.75 mg/dL). Liver Function Tests: Recent Labs  Lab 10/20/20 0848  AST 14*  ALT <5  ALKPHOS 91  BILITOT 1.4*  PROT 7.1  ALBUMIN 3.3*   No results for input(s): LIPASE, AMYLASE in the last 168 hours. No results for input(s): AMMONIA in the last 168 hours. Coagulation Profile: No results for input(s): INR, PROTIME in the last 168 hours. Cardiac Enzymes: No results for input(s): CKTOTAL, CKMB, CKMBINDEX, TROPONINI in the last 168 hours. BNP (last 3 results) No results for input(s): PROBNP in the last 8760 hours. HbA1C: No results for input(s): HGBA1C in the last 72 hours. CBG: No results for input(s): GLUCAP in the last 168 hours. Lipid Profile: No results for input(s): CHOL, HDL, LDLCALC, TRIG, CHOLHDL,  LDLDIRECT in the last 72 hours. Thyroid Function Tests: No results for input(s): TSH, T4TOTAL, FREET4, T3FREE, THYROIDAB in the last 72 hours. Anemia Panel: No results for input(s): VITAMINB12, FOLATE, FERRITIN, TIBC, IRON, RETICCTPCT in the last 72 hours. Sepsis Labs: Recent Labs  Lab 10/20/20 0848 10/20/20 1038 10/22/20 0604 10/24/20 0804 10/25/20 0449 10/26/20 0542  PROCALCITON  --   --  0.31 0.16 <0.10 <0.10  LATICACIDVEN 1.3 1.3  --   --   --   --     Recent Results (from the past 240 hour(s))  Resp Panel by RT-PCR (Flu A&B, Covid) Nasopharyngeal Swab     Status: None   Collection Time: 10/20/20  8:49 AM   Specimen: Nasopharyngeal Swab; Nasopharyngeal(NP) swabs in vial transport medium  Result Value Ref Range Status   SARS Coronavirus 2 by RT PCR NEGATIVE NEGATIVE Final    Comment: (NOTE) SARS-CoV-2 target nucleic acids are NOT DETECTED.  The SARS-CoV-2 RNA is generally detectable in upper respiratory specimens during the acute phase of infection. The lowest concentration of SARS-CoV-2 viral copies this assay can detect is 138 copies/mL. A negative result does not  preclude SARS-Cov-2 infection and should not be used as the sole basis for treatment or other patient management decisions. A negative result may occur with  improper specimen collection/handling, submission of specimen other than nasopharyngeal swab, presence of viral mutation(s) within the areas targeted by this assay, and inadequate number of viral copies(<138 copies/mL). A negative result must be combined with clinical observations, patient history, and epidemiological information. The expected result is Negative.  Fact Sheet for Patients:  BloggerCourse.com  Fact Sheet for Healthcare Providers:  SeriousBroker.it  This test is no t yet approved or cleared by the [place] FDA and  has been authorized for detection and/or diagnosis of SARS-CoV-2 by FDA under an Emergency Use Authorization (EUA). This EUA will remain  in effect (meaning this test can be used) for the duration of the COVID-19 declaration under Section 564(b)(1) of the Act, 21 U.S.C.section 360bbb-3(b)(1), unless the authorization is terminated  or revoked sooner.       Influenza A by PCR NEGATIVE NEGATIVE Final   Influenza B by PCR NEGATIVE NEGATIVE Final    Comment: (NOTE) The Xpert Xpress SARS-CoV-2/FLU/RSV plus assay is intended as an aid in the diagnosis of influenza from Nasopharyngeal swab specimens and should not be used as a sole basis for treatment. Nasal washings and aspirates are unacceptable for Xpert Xpress SARS-CoV-2/FLU/RSV testing.  Fact Sheet for Patients: BloggerCourse.com  Fact Sheet for Healthcare Providers: SeriousBroker.it  This test is not yet approved or cleared by the [place] FDA and has been authorized for detection and/or diagnosis of SARS-CoV-2 by FDA under an Emergency Use Authorization (EUA). This EUA will remain in effect (meaning this test can be used) for the  duration of the COVID-19 declaration under Section 564(b)(1) of the Act, 21 U.S.C. section 360bbb-3(b)(1), unless the authorization is terminated or revoked.  Performed at [facility], [address], [place], [place] [address]   Urine culture     Status: None   Collection Time: 10/20/20 11:35 AM   Specimen: Urine, Random  Result Value Ref Range Status   Specimen Description   Final    URINE, RANDOM Performed at [facility], [address] [place], [place] [address]    Special Requests   Final    NONE Performed at [facility], [address] [place] [place], [place] [address]  Culture   Final    NO GROWTH Performed at [facility], [address] [place], [place] [address]    Report Status 10/21/2020 FINAL  Final  Body fluid culture     Status: None   Collection Time: 10/20/20  1:30 PM   Specimen: PATH Cytology Pleural fluid  Result Value Ref Range Status   Specimen Description   Final    PLEURAL Performed at [facility], [address] [place], [place] [address]    Special Requests   Final    NONE Performed at [facility], [address] [place], [place] [address]    Gram Stain   Final    MODERATE WBC PRESENT,BOTH PMN AND MONONUCLEAR NO ORGANISMS SEEN    Culture   Final    NO GROWTH 3 DAYS Performed at [facility], [address], [place], [place] [address]    Report Status 10/23/2020 FINAL  Final  CULTURE, BLOOD (ROUTINE X 2) w Reflex to ID Panel     Status: None (Preliminary result)   Collection Time: 10/23/20  8:51 AM   Specimen: BLOOD  Result Value Ref Range Status   Specimen Description BLOOD BLOOD LEFT ARM  Final   Special Requests   Final    BOTTLES DRAWN AEROBIC AND ANAEROBIC Blood Culture results may not be optimal due to an excessive volume of blood received in culture bottles   Culture   Final    NO GROWTH 3 DAYS Performed at [facility], [address] [place],  [place] [address]    Report Status PENDING  Incomplete  CULTURE, BLOOD (ROUTINE X 2) w Reflex to ID Panel     Status: None (Preliminary result)   Collection Time: 10/23/20  8:58 AM   Specimen: BLOOD  Result Value Ref Range Status   Specimen Description BLOOD LEFT ANTECUBITAL  Final   Special Requests   Final    BOTTLES DRAWN AEROBIC AND ANAEROBIC Blood Culture adequate volume   Culture   Final    NO GROWTH 3 DAYS Performed at [facility], [address] [place], [place] [address]    Report Status PENDING  Incomplete  Urine Culture     Status: None   Collection Time: 10/23/20  2:00 PM   Specimen: Urine, Random  Result Value Ref Range Status   Specimen Description   Final    URINE, RANDOM Performed at [facility], [address] [place], [place] [address]    Special Requests   Final    NONE Performed at [facility], [address] [place], [place] [address]    Culture   Final    NO GROWTH Performed at [facility], [address] [place], [place] [address]    Report Status 10/25/2020 FINAL  Final     Radiology Studies: No results found.  Scheduled Meds: . amantadine  100 mg Oral BID  . carbidopa-levodopa  1.5 tablet Oral QID  . Chlorhexidine Gluconate Cloth  6 each Topical Daily  . clonazePAM  1 mg Oral QHS  . entacapone  200 mg Oral QID  . feeding supplement  237 mL Oral TID BM  . heparin  5,000 Units Subcutaneous Q8H  . mouth rinse  15 mL Mouth Rinse BID  . [START ON 10/27/2020] multivitamin with minerals  1 tablet Oral Daily  . polyethylene glycol  17 g Oral Daily  . rasagiline  1 mg Oral Daily  .  sodium chloride flush  3 mL Intravenous Q12H   Continuous Infusions: . sodium chloride    . azithromycin 500 mg (10/26/20 0959)  . cefTRIAXone (ROCEPHIN)  IV 2 g (10/26/20 1208)     LOS: 6 days   Time spent: 25 minutes.  Arnetha Courser, MD [facility]  If 7PM-7AM, please contact night-coverage [email or web address]  10/26/2020,  4:27 PM   This record has been created using Conservation officer, historic buildings. Errors have been sought and corrected,but may not always be located. Such creation errors do not reflect on the standard of care.

## 2020-10-26 NOTE — Care Management Important Message (Signed)
Important Message  Patient Details  Name: Mahamud Metts MRN: [id number] Date of Birth: 22-Oct-1940   Medicare Important Message Given:  Yes     [name] 10/26/2020, 11:25 AM

## 2020-10-26 NOTE — Progress Notes (Addendum)
Initial Nutrition Assessment  DOCUMENTATION CODES:   Severe malnutrition in context of chronic illness  INTERVENTION:   Ensure Enlive po TID, each supplement provides 350 kcal and 20 grams of protein  Magic cup TID with meals, each supplement provides 290 kcal and 9 grams of protein  MVI po daily  Dysphagia 3 diet   Pt at high refeed risk; recommend monitor potassium, magnesium and phosphorus labs daily until stable  NUTRITION DIAGNOSIS:   Severe Malnutrition related to chronic illness (Parkinson's disease, dementia) as evidenced by severe muscle depletion,severe fat depletion, 19 percent weight loss in < 6 months.  GOAL:   Patient will meet greater than or equal to 90% of their needs  MONITOR:   PO intake,Supplement acceptance,Labs,Weight trends,Skin,I & O's  REASON FOR ASSESSMENT:   Malnutrition Screening Tool    ASSESSMENT:   80 y.o. male with medical history significant for Parkinson's disease, GERD, BPH and dementia who is admitted with CAP and AKI   Pt s/p thoracentesis 2/8 with 162m output  Met with pt and pt's wife in room today. Pt is a poor historian so majority of history obtained from patient's wife at bedside. Wife reports patient with good appetite and oral intake at baseline but reports that patient's appetite has been decreased for 1-2 weeks pta. Wife reports that patient's appetite and oral intake is improved today; pt eating 95-100% of his meals today. Pt documented to be eating anywhere from 30-100% of meals since admit. Pt does enjoy chocolate Ensure and he drinks this at home; wife reports that patient did drink an Ensure today. Wife requesting mechanical soft diet; wife reports that patient had oral surgery ~1 year ago and now he is unable to bite into tough things. RD will add supplements and MVI to help pt meet his estimated needs. RD will also change pt to a dysphagia 3 diet. Per chart, pt is down 31lbs(19%) over the past 6 months; this is severe  weight loss. Pt's UBW appears to be ~155-160lbs.   Medications reviewed and include: heparin, miralax, azithromycin, ceftriaxone   Labs reviewed: wbc- 13.4(H)  NUTRITION - FOCUSED PHYSICAL EXAM:  Flowsheet Row Most Recent Value  Orbital Region Severe depletion  Upper Arm Region Severe depletion  Thoracic and Lumbar Region Severe depletion  Buccal Region Severe depletion  Temple Region Severe depletion  Clavicle Bone Region Severe depletion  Clavicle and Acromion Bone Region Severe depletion  Scapular Bone Region Severe depletion  Dorsal Hand Severe depletion  Patellar Region Severe depletion  Anterior Thigh Region Severe depletion  Posterior Calf Region Severe depletion  Edema (RD Assessment) None  Hair Reviewed  Eyes Reviewed  Mouth Reviewed  Skin Reviewed  Nails Reviewed     Diet Order:   Diet Order            DIET DYS 3 Room service appropriate? Yes; Fluid consistency: Thin  Diet effective now                EDUCATION NEEDS:   Education needs have been addressed (with patient's wife)  Skin:  Skin Assessment: Reviewed RN Assessment (ecchymosis)  Last BM:  PTA  Height:   Ht Readings from Last 1 Encounters:  10/20/20 _0  (1.727 m)    Weight:   Wt Readings from Last 1 Encounters:  10/24/20 59 kg    Ideal Body Weight:  70 kg  BMI:  Body mass index is 19.77 kg/m.  Estimated Nutritional Needs:   Kcal:  1700-1900kcal/day  Protein:  85-95g/day  Fluid:  1.5-1.8L/day  Koleen Distance MS, RD, LDN Please refer to [facility] for RD and/or RD on-call/weekend/after hours pager

## 2020-10-26 NOTE — Progress Notes (Addendum)
SLP Cancellation Note  Patient Details Name: Joshua George MRN: [id number] DOB: October 04, 1940   Cancelled treatment:       Reason Eval/Treat Not Completed: Patient at procedure or test/unavailable (chart reviewed; consulted NSG then met w/ Wife and pt). Pt was indisposed x2 attempts(bedpan). Lengthy discussion w/ Wife who stated pt was swallowing "well" today and denied any coughing w/ liquids from Lunch meal. She stated he was less confused in the past 2 days now. Discussed aspiration and dysphagia; impact of Parkinson's Dis. On swallowing; general aspiration precautions including Pills given in Puree for safer swallowing. Wife and NSG agreed. ST services will f/u in the morning w/ evaluation. NSG updated.     Orinda Kenner, MS, CCC-SLP Speech Language Pathologist Rehab Services [phone] [facility] 10/26/2020, 4:46 PM

## 2020-10-26 NOTE — Progress Notes (Signed)
Physical Therapy Treatment Patient Details Name: Joshua George MRN: [id number] DOB: 1941-09-03 Today's Date: 10/26/2020    History of Present Illness Pt is a 80yo M admitted to [facility] on 10/20/20 for c/o increased weakness, confusion, agitation, and slurred speech. Spouse notes decreased oral intake and generalized weakness. Significant PMH includes: PD and dementia. Pt notes that he  has been on/off of his PD medications for about a week, but resumed medication a few days ago. Pt dx with UTI last week and is currently being treated with antibiotic. Pt s/p L thoracentesis yielding of yellow fluid. Imaging revealed large L sided pleural effusion with underlying atelectasis/infiltrate and small R sided pleural effusion.    PT Comments    Pt was pleasant and motivated to participate during the session.  Pt required min A during log roll training for BLE and trunk control and then min A to come to standing initially. After practicing anterior weight shifting in sitting pt was able to come to standing with CGA only.  Pt was able to perform standing marching therex prior to attempting ambulation but then had a large loose BM on the bed/floor with ambulation deferred.  Pt will benefit from PT services in a SNF setting upon discharge to safely address deficits listed in patient problem list for decreased caregiver assistance and eventual return to PLOF.     Follow Up Recommendations  SNF;Supervision for mobility/OOB;Supervision - Intermittent     Equipment Recommendations  Other (comment) (TBD at next venue of care)    Recommendations for Other Services       Precautions / Restrictions Precautions Precautions: Fall Restrictions Weight Bearing Restrictions: No    Mobility  Bed Mobility Overal bed mobility: Needs Assistance Bed Mobility: Rolling;Sit to Sidelying;Sidelying to Sit Rolling: Min assist Sidelying to sit: Min assist     Sit to sidelying: Min assist General bed mobility  comments: Log roll training with mod verbal and tactile cues for sequencing and min A for BLE and trunk control    Transfers Overall transfer level: Needs assistance Equipment used: Rolling walker (2 wheeled) Transfers: Sit to/from Stand Sit to Stand: Min assist;Min guard         General transfer comment: Min A that progressed to CGA after training with emphasis on increased trunk flexion  Ambulation/Gait             General Gait Details: Gait deferred secondary to pt having BM while standing at EOB performing standing therex   Stairs             Wheelchair Mobility    Modified Rankin (Stroke Patients Only)       Balance Overall balance assessment: Needs assistance Sitting-balance support: No upper extremity supported;Feet supported Sitting balance-Leahy Scale: Poor Sitting balance - Comments: Required intermittent min A to prevent posterior LOB Postural control: Posterior lean   Standing balance-Leahy Scale: Poor Standing balance comment: Min A and mod lean on the RW for support                            Cognition Arousal/Alertness: Lethargic Behavior During Therapy: Flat affect Overall Cognitive Status: Within Functional Limits for tasks assessed                                        Exercises Total Joint Exercises Ankle Circles/Pumps: AROM;Strengthening;Both;10 reps Quad Sets:  Strengthening;Both;10 reps Gluteal Sets: Strengthening;Both;10 reps Heel Slides: AROM;Strengthening;Both;5 reps Hip ABduction/ADduction: AROM;Strengthening;Both;5 reps Long Arc Quad: AROM;Strengthening;Both;15 reps Knee Flexion: AROM;Strengthening;Both;15 reps Marching in Standing: AROM;Strengthening;Both;10 reps;Standing Other Exercises Other Exercises: Log rolling training Other Exercises: Multiple sit to/from stand transfer training with cues for sequencing most notably for increased trunk flexion    General Comments        Pertinent  Vitals/Pain Pain Assessment: No/denies pain    Home Living                      Prior Function            PT Goals (current goals can now be found in the care plan section) Progress towards PT goals: Progressing toward goals    Frequency    Min 2X/week      PT Plan Current plan remains appropriate    Co-evaluation              AM-PAC PT "6 Clicks" Mobility   Outcome Measure  Help needed turning from your back to your side while in a flat bed without using bedrails?: A Little Help needed moving from lying on your back to sitting on the side of a flat bed without using bedrails?: A Little Help needed moving to and from a bed to a chair (including a wheelchair)?: A Little Help needed standing up from a chair using your arms (e.g., wheelchair or bedside chair)?: A Little Help needed to walk in hospital room?: A Lot Help needed climbing 3-5 steps with a railing? : Total 6 Click Score: 15    End of Session Equipment Utilized During Treatment: Gait belt Activity Tolerance: Patient tolerated treatment well Patient left: in bed;with nursing/sitter in room;with family/visitor present Nurse Communication: Mobility status PT Visit Diagnosis: Muscle weakness (generalized) (M62.81);Other abnormalities of gait and mobility (R26.89)     Time: 6433-2951 PT Time Calculation (min) (ACUTE ONLY): 28 min  Charges:  $Therapeutic Exercise: 8-22 mins $Therapeutic Activity: 8-22 mins                    D. Scott Jaceion Aday PT, DPT 10/26/20, 3:58 PM

## 2020-10-27 NOTE — Evaluation (Signed)
Clinical/Bedside Swallow Evaluation Patient Details  Name: Joshua George MRN: [id number] Date of Birth: 08-Sep-1941  Today's Date: 10/27/2020 Time: SLP Start Time (ACUTE ONLY): 1000 SLP Stop Time (ACUTE ONLY): 1100 SLP Time Calculation (min) (ACUTE ONLY): 60 min  Past Medical History:  Past Medical History:  Diagnosis Date  . BPH (benign prostatic hyperplasia)   . Dementia (HCC)   . GERD (gastroesophageal reflux disease)   . Parkinson's disease [facility])    Past Surgical History:  Past Surgical History:  Procedure Laterality Date  . TONSILLECTOMY     HPI:  Pt is a 80 y.o. male with medical history significant for Parkinson's disease, GERD, BPH and Dementia who presents to the ER by EMS for evaluation of progressively worsening generalized weakness requiring a 1 person assist which is unusual for the patient.  Poor p.o. intake and worsening generalized weakness over the past 1 to 2 weeks.  The wife also states that he has been confused his speech has been mumbled/slurred over the last 24 hours.  Per his wife, he has been out of his clonazepam for couple of days and this was recently resumed by his neurologist.  He was also started on Macrobid for a presumed urinary tract infection.  She notes that over the last couple of days his oral intake has been poor and he has been very weak.  Patient complains of pain in the left flank area as well as his back for about 3 days.  His wife states that he fell 1 day prior to being hospitalized but was able to get up without any difficulty.  Pt seen by Urology; previous UTI w/ meds last week and now noted to have bladder distention.  Foley catheter placed with return of 1800 mL of clear urine post admit.  Chest CT post admit: "Confluent collapse/consolidation in the lingula and left lower  lobe with moderate to large left and small right pleural effusions".   Assessment / Plan / Recommendation Clinical Impression  Pt appears to present w/ Mild s/s of  oropharyngeal phase dysphagia impacted by Baseline issues of Parkinson's Dis, and Dementia(per chart) as well as Missing Lower Dentition s/p surgery on lower jaw when part of his jaw and teeth were removed for some growth per Wife - this results in min decreased articulation of speech. No immediate s/s of pharyngeal phase dysphagia noted, however, suspect a mild delay in pharyngeal swallow initiation w/ impact from neuromuscular deficits including lingual tremor and the impact of Dementia on swallowing for many. Pt also has weak UE for self feeding currently and requires full assistance. Pt consumed po trials given w/ No immediate, overt, clinical s/s of aspiration during po trials. Pt appears at reduced risk for aspiration following aspiration precautions -- he prefers to use a Straw when drinking for better oral control d/t lower dentition/mandible receding. During po trials, pt consumed consistencies w/ no immediate, overt coughing, decline in vocal quality, or change in respiratory presentation during/post trials. However, a Mild cough was noted post several trials in a row of thin liquids. Encouraged pt to go slowly and take Single sips only. Oral phase appeared [facility] w/ timely bolus management and control of bolus propulsion for A-P transfer for swallowing though Mild lingual tremors were apparent which can impact timely bolus control. Oral clearing achieved w/ all trial consistencies given Time -- pt was instructed on lingual sweeping to check for full oral clearing. OM Exam revealed no unilateral weakness but mild oral/lingual tremors. Speech articulation Fair+; low  volume(baseline). Pt fed self by Holding Cup to drink; supported w/ food trials/utensil. Recommend a more Mech Soft consistency diet w/ well-Cut meats, moistened foods; Thin liquids w/ Straw(monitored) as he uses Straws at home. Recommend general aspiration precautions, Pills WHOLE in Puree for safer, easier swallowing as pt described Larger pills  causing difficulty to swallow. Education given on Pills in Puree; food consistencies and easy to eat options; general aspiration precautions. ST services will continue to follow w/ education, toleration of diet. Wife agreed; Handouts given. NSG updated. SLP Visit Diagnosis: Dysphagia, oropharyngeal phase (R13.12)    Aspiration Risk  Mild aspiration risk;Risk for inadequate nutrition/hydration    Diet Recommendation  Mech Soft diet w/ chopped meats, gravies to moisten. Thin liquids -- monitor pt Holding Cup Himself when Drinking. General aspiration precautions; support at meals w/ feeding of foods as needed -- MUST sit fully upright for all po's  Medication Administration: Whole meds with puree (for safer swallowing)    Other  Recommendations Recommended Consults:  (Dietician f/u for support; Palliative Care f/u) Oral Care Recommendations: Oral care BID;Oral care before and after PO;Staff/trained caregiver to provide oral care Other Recommendations:  (n/a currently)   Follow up Recommendations  (TBD)      Frequency and Duration min 2x/week  2 weeks       Prognosis Prognosis for Safe Diet Advancement: Fair Barriers to Reach Goals: Cognitive deficits;Time post onset;Severity of deficits (Parkinson's Dis.; Dementia; missing Dentition baseline)      Swallow Study   General Date of Onset: 10/20/20 HPI: Pt is a 80 y.o. male with medical history significant for Parkinson's disease, GERD, BPH and Dementia who presents to the ER by EMS for evaluation of progressively worsening generalized weakness requiring a 1 person assist which is unusual for the patient.  The wife also states that he has been confused his speech has been mumbled/slurred over the last 24 hours.  Per his wife, he has been out of his clonazepam for couple of days and this was recently resumed by his neurologist.  He was also started on Macrobid for a presumed urinary tract infection.  She notes that over the last couple of days  his oral intake has been poor and he has been very weak.  Patient complains of pain in the left flank area as well as his back for about 3 days.  His wife states that he fell 1 day prior to being hospitalized but was able to get up without any difficulty.  Pt seen by Urology; previous UTI w/ meds last week and now noted to have bladder distention.  Foley catheter placed with return of 1800 mL of clear urine post admit.  Chest CT post admit: "Confluent collapse/consolidation in the lingula and left lower  lobe with moderate to large left and small right pleural effusions". Type of Study: Bedside Swallow Evaluation Previous Swallow Assessment: none Diet Prior to this Study: Regular;Thin liquids Temperature Spikes Noted: No (wbc 13.4) Respiratory Status: Room air History of Recent Intubation: No Behavior/Cognition: Alert;Cooperative;Pleasant mood (Parkinson's Dis, Dementia per chart) Oral Cavity Assessment: Dry;Dried secretions Oral Care Completed by SLP: Yes Oral Cavity - Dentition: Dentures, top (partial; few lower molars w/ others missing) Vision: Functional for self-feeding Self-Feeding Abilities: Able to feed self;Needs assist;Needs set up;Total assist (weak UEs) Patient Positioning: Upright in bed (needed positioning) Baseline Vocal Quality: Low vocal intensity (min mumbled, dysarthria -- missing lower dentition) Volitional Cough:  (Fair+ effort and VC contact) Volitional Swallow: Able to elicit  Oral/Motor/Sensory Function Overall Oral Motor/Sensory Function: Mild impairment Facial ROM: Within Functional Limits Facial Symmetry: Within Functional Limits Facial Strength: Within Functional Limits Lingual ROM: Within Functional Limits Lingual Symmetry: Within Functional Limits Lingual Strength: Reduced (min -- tremors also) Velum: Within Functional Limits Mandible: Within Functional Limits   Ice Chips Ice chips: Within functional limits Presentation: Spoon (fed; 3 trials)   Thin Liquid  Thin Liquid: Impaired Presentation: Straw (pt prefers for ease of drinking; 10 trials) Oral Phase Impairments:  (none) Pharyngeal  Phase Impairments: Suspected delayed Swallow;Throat Clearing - Delayed (x1; audible swallows)    Nectar Thick Nectar Thick Liquid: Not tested   Honey Thick Honey Thick Liquid: Not tested   Puree Puree: Within functional limits Presentation: Spoon (fed; 8 trials) Other Comments: adequate   Solid     Solid: Not tested       Jerilynn Som, MS, CCC-SLP Speech Language Pathologist Rehab [facility] [phone] Rolena Knutson 10/27/2020,1:49 PM

## 2020-10-27 NOTE — Progress Notes (Addendum)
PROGRESS NOTE    Joshua George  BJY:[id number] DOB: 1941/07/30 DOA: 10/20/2020 PCP: Kandyce Rud, MD   Brief Narrative: Taken from H&P. Joshua George is a 80 y.o. male with medical history significant for Parkinson's disease, GERD, BPH and dementia who presents to the ER by EMS for evaluation of progressively worsening generalized weakness requiring a 1 person assist which is unusual for the patient.  Poor p.o. intake and worsening generalized weakness over the past 1 to 2 weeks. Recently treated for a presumed UTI. Patient was found to have AKI and urinary retention.  Foley with more than 2 L of urine, AKI started improving with Foley.  Patient has slurred speech and dysarthria since his surgery on lower jaw when part of his jaw and teeth were removed for some growth per wife. Imaging with right-sided pleural effusion s/p thoracentesis and initial labs consistent with transudate. CT abdomen with bilateral nephrolithiasis and right hydronephrosis with prominent left perinephric edema. Urology was also consulted and they are recommending continue with Foley and outpatient follow-up in 2 weeks.  Patient developed fever along with increased lethargy and decreased responsiveness.  Procalcitonin positive at 0.31>>0.16, repeat blood and urine cultures remain negative.  Now at his baseline. Completed the course of antibiotics.  Just received message later in the day that patient had a bed offer, pending insurance authorization.  Ordered Covid testing for a possible discharge tomorrow.  Subjective: Patient was sitting in his bed comfortably.  Wife at bedside.  No new complaint.  Able to eat breakfast with the help of wife.  Assessment & Plan:   Principal Problem:   AKI (acute kidney injury) (HCC) Active Problems:   Hyponatremia   CAP (community acquired pneumonia)   Parapneumonic effusion   Parkinson's disease (HCC)   GERD (gastroesophageal reflux disease)   Protein-calorie malnutrition,  severe  Fever.  Remained afebrile   Patient developed fever 10/22/20 afternoon with maximum temperature recorded was 101.2.  Becomes more lethargic.  CBC with persistent leukocytosis, procalcitonin has been normalized and repeat urine and blood cultures remain negative.. Chest x-ray without any obvious infiltrate  To the course of ceftriaxone and Zithromax.  Concern of coughing with drinking. We obtained swallow evaluation, had mild aspiration risk due to some oropharyngeal dysphagia secondary to Parkinson's.  Swallow team evaluation are in his chart.  AKI. Resolved Significant improvement in creatinine after Foley placement.  Most likely secondary to urinary obstruction.  Baseline around 1 -Continue to monitor -Avoid nephrotoxins -Patient will be discharged with Foley catheter and will follow up with urology as an outpatient, he will need an appointment with urology before discharge.  Pleural effusion.  Patient with bilateral pleural effusions, left more than right with some concern of left lower lobe collapse.  Patient with leukocytosis with left shift.  No prior history of heart failure. S/p thoracentesis with removal of about 1650 mL of fluid, initial labs appears transudate, cultures negative. -Repeat chest x-ray with small posterior pleural effusions and some interstitial congestion.  No obvious infiltrate. -Echocardiogram done-normal EF, no wall motion abnormalities and mild to moderate valvular regurgitations.  Hypernatremia.  Resolved -Encourage p.o. hydration. -Continue to monitor  Parkinsonism with worsening generalized weakness.  May be disease progression.  PT is recommending SNF placement. -TOC consult for placement. -Continue home meds  Protein caloric malnutrition. Body mass index is 19.77 kg/m. -Dietitian consult.   Objective: Vitals:   10/27/20 0444 10/27/20 0742 10/27/20 1210 10/27/20 1330  BP: (!) 147/81 (!) 149/82 116/73 115/65  Pulse: 68 69  69 67  Resp: 20  18 18 16   Temp: 98.9 F (37.2 C) 98.2 F (36.8 C) 98.9 F (37.2 C) 97.6 F (36.4 C)  TempSrc: Oral Oral  Oral  SpO2: 96% 96% 97% 97%  Weight:      Height:        Intake/Output Summary (Last 24 hours) at 10/27/2020 1407 Last data filed at 10/27/2020 0900 Gross per 24 hour  Intake 0 ml  Output 1925 ml  Net -1925 ml   Filed Weights   10/22/20 0300 10/23/20 0100 10/24/20 0300  Weight: 60 kg 61.9 kg 59 kg    Examination:  General.  Frail elderly man, in no acute distress. Pulmonary.  Lungs clear bilaterally, normal respiratory effort. CV.  Regular rate and rhythm, no JVD, rub or murmur. Abdomen.  Soft, nontender, nondistended, BS positive. CNS.  Alert and oriented x3.  No focal neurologic deficit. Extremities.  No edema, no cyanosis, pulses intact and symmetrical. Psychiatry.  Judgment and insight appears normal.  DVT prophylaxis: Heparin Code Status: DNR Family Communication: Wife was updated at bedside Disposition Plan:  Status is: Inpatient  Remains inpatient appropriate because:Inpatient level of care appropriate due to severity of illness   Dispo: The patient is from: Home              Anticipated d/c is to: SNF              Anticipated d/c date is: 1 day.              Patient currently is medically stable.   Difficult to place patient Yes              Level of care: Med-Surg  Consultants:   Urology  Nephrology  Procedures:  Antimicrobials:   Data Reviewed: I have personally reviewed following labs and imaging studies  CBC: Recent Labs  Lab 10/21/20 0502 10/22/20 0604 10/23/20 0904 10/25/20 0449 10/26/20 0542  WBC 12.3* 12.6* 12.3* 13.3* 13.4*  HGB 13.4 12.5* 13.3 12.5* 13.7  HCT 38.5* 37.7* 39.7 39.0 40.7  MCV 88.9 90.6 91.3 93.5 92.1  PLT 182 201 195 186 185   Basic Metabolic Panel: Recent Labs  Lab 10/21/20 0502 10/22/20 0604 10/23/20 0415 10/23/20 0904 10/24/20 0804 10/26/20 0542  NA 145 148* 148*  --  147* 139  K 3.8 3.5 3.4*  --   3.5 4.0  CL 111 114* 111  --  109 104  CO2 21* 24 26  --  27 26  GLUCOSE 87 100* 91  --  100* 89  BUN 38* 19 19  --  24* 20  CREATININE 1.80* 0.89 0.85  --  0.92 0.75  CALCIUM 8.1* 7.7* 7.8*  --  7.9* 7.8*  MG  --   --   --  2.1  --   --    GFR: Estimated Creatinine Clearance: 63.5 mL/min (by C-G formula based on SCr of 0.75 mg/dL). Liver Function Tests: No results for input(s): AST, ALT, ALKPHOS, BILITOT, PROT, ALBUMIN in the last 168 hours. No results for input(s): LIPASE, AMYLASE in the last 168 hours. No results for input(s): AMMONIA in the last 168 hours. Coagulation Profile: No results for input(s): INR, PROTIME in the last 168 hours. Cardiac Enzymes: No results for input(s): CKTOTAL, CKMB, CKMBINDEX, TROPONINI in the last 168 hours. BNP (last 3 results) No results for input(s): PROBNP in the last 8760 hours. HbA1C: No results for input(s): HGBA1C in the last 72 hours. CBG: No results  for input(s): GLUCAP in the last 168 hours. Lipid Profile: No results for input(s): CHOL, HDL, LDLCALC, TRIG, CHOLHDL, LDLDIRECT in the last 72 hours. Thyroid Function Tests: No results for input(s): TSH, T4TOTAL, FREET4, T3FREE, THYROIDAB in the last 72 hours. Anemia Panel: No results for input(s): VITAMINB12, FOLATE, FERRITIN, TIBC, IRON, RETICCTPCT in the last 72 hours. Sepsis Labs: Recent Labs  Lab 10/22/20 0604 10/24/20 0804 10/25/20 0449 10/26/20 0542  PROCALCITON 0.31 0.16 <0.10 <0.10    Recent Results (from the past 240 hour(s))  Resp Panel by RT-PCR (Flu A&B, Covid) Nasopharyngeal Swab     Status: None   Collection Time: 10/20/20  8:49 AM   Specimen: Nasopharyngeal Swab; Nasopharyngeal(NP) swabs in vial transport medium  Result Value Ref Range Status   SARS Coronavirus 2 by RT PCR NEGATIVE NEGATIVE Final    Comment: (NOTE) SARS-CoV-2 target nucleic acids are NOT DETECTED.  The SARS-CoV-2 RNA is generally detectable in upper respiratory specimens during the acute phase  of infection. The lowest concentration of SARS-CoV-2 viral copies this assay can detect is 138 copies/mL. A negative result does not preclude SARS-Cov-2 infection and should not be used as the sole basis for treatment or other patient management decisions. A negative result may occur with  improper specimen collection/handling, submission of specimen other than nasopharyngeal swab, presence of viral mutation(s) within the areas targeted by this assay, and inadequate number of viral copies(<138 copies/mL). A negative result must be combined with clinical observations, patient history, and epidemiological information. The expected result is Negative.  Fact Sheet for Patients:  BloggerCourse.comhttps://www.fda.gov/media/152166/download  Fact Sheet for Healthcare Providers:  SeriousBroker.ithttps://www.fda.gov/media/152162/download  This test is no t yet approved or cleared by the [place] FDA and  has been authorized for detection and/or diagnosis of SARS-CoV-2 by FDA under an Emergency Use Authorization (EUA). This EUA will remain  in effect (meaning this test can be used) for the duration of the COVID-19 declaration under Section 564(b)(1) of the Act, 21 U.S.C.section 360bbb-3(b)(1), unless the authorization is terminated  or revoked sooner.       Influenza A by PCR NEGATIVE NEGATIVE Final   Influenza B by PCR NEGATIVE NEGATIVE Final    Comment: (NOTE) The Xpert Xpress SARS-CoV-2/FLU/RSV plus assay is intended as an aid in the diagnosis of influenza from Nasopharyngeal swab specimens and should not be used as a sole basis for treatment. Nasal washings and aspirates are unacceptable for Xpert Xpress SARS-CoV-2/FLU/RSV testing.  Fact Sheet for Patients: BloggerCourse.comhttps://www.fda.gov/media/152166/download  Fact Sheet for Healthcare Providers: SeriousBroker.ithttps://www.fda.gov/media/152162/download  This test is not yet approved or cleared by the [place] FDA and has been authorized for detection and/or diagnosis of  SARS-CoV-2 by FDA under an Emergency Use Authorization (EUA). This EUA will remain in effect (meaning this test can be used) for the duration of the COVID-19 declaration under Section 564(b)(1) of the Act, 21 U.S.C. section 360bbb-3(b)(1), unless the authorization is terminated or revoked.  Performed at [facility], [address] [place], [place] [address]   Urine culture     Status: None   Collection Time: 10/20/20 11:35 AM   Specimen: Urine, Random  Result Value Ref Range Status   Specimen Description   Final    URINE, RANDOM Performed at [facility], [address] [place], [place] [address]    Special Requests   Final    NONE Performed at [facility], [address] [place], [place] [address]    Culture   Final  NO GROWTH Performed at [facility], [address] [place], [place] [address]    Report Status 10/21/2020 FINAL  Final  Body fluid culture     Status: None   Collection Time: 10/20/20  1:30 PM   Specimen: PATH Cytology Pleural fluid  Result Value Ref Range Status   Specimen Description   Final    PLEURAL Performed at [facility], [address] [place], [place] [address]    Special Requests   Final    NONE Performed at [facility], [address] [place], [place] [address]    Gram Stain   Final    MODERATE WBC PRESENT,BOTH PMN AND MONONUCLEAR NO ORGANISMS SEEN    Culture   Final    NO GROWTH 3 DAYS Performed at [facility], [address], [place], [place] [address]    Report Status 10/23/2020 FINAL  Final  CULTURE, BLOOD (ROUTINE X 2) w Reflex to ID Panel     Status: None (Preliminary result)   Collection Time: 10/23/20  8:51 AM   Specimen: BLOOD  Result Value Ref Range Status   Specimen Description BLOOD BLOOD LEFT ARM  Final   Special Requests   Final    BOTTLES DRAWN AEROBIC AND ANAEROBIC Blood Culture results may not be optimal due to an excessive volume of blood  received in culture bottles   Culture   Final    NO GROWTH 4 DAYS Performed at [facility], [address], [place], [place] [address]    Report Status PENDING  Incomplete  CULTURE, BLOOD (ROUTINE X 2) w Reflex to ID Panel     Status: None (Preliminary result)   Collection Time: 10/23/20  8:58 AM   Specimen: BLOOD  Result Value Ref Range Status   Specimen Description BLOOD LEFT ANTECUBITAL  Final   Special Requests   Final    BOTTLES DRAWN AEROBIC AND ANAEROBIC Blood Culture adequate volume   Culture   Final    NO GROWTH 4 DAYS Performed at [facility], [address] [place], [place] [address]    Report Status PENDING  Incomplete  Urine Culture     Status: None   Collection Time: 10/23/20  2:00 PM   Specimen: Urine, Random  Result Value Ref Range Status   Specimen Description   Final    URINE, RANDOM Performed at [facility], [address] [place], [place] [address]    Special Requests   Final    NONE Performed at [facility], [address] [place], [place] [address]    Culture   Final    NO GROWTH Performed at [facility], [address] [place], [place] [address]    Report Status 10/25/2020 FINAL  Final     Radiology Studies: No results found.  Scheduled Meds: . amantadine  100 mg Oral BID  . carbidopa-levodopa  1.5 tablet Oral QID  . Chlorhexidine Gluconate Cloth  6 each Topical Daily  . clonazePAM  1 mg Oral QHS  . entacapone  200 mg Oral QID  . feeding supplement  237 mL Oral TID BM  . heparin  5,000 Units Subcutaneous Q8H  . mouth rinse  15 mL Mouth Rinse BID  . multivitamin with minerals  1 tablet Oral Daily  . polyethylene glycol  17 g Oral Daily  . rasagiline  1 mg Oral Daily  . sodium chloride flush  3 mL Intravenous Q12H  Continuous Infusions: . sodium chloride       LOS: 7 days   Time spent: 25 minutes.  Arnetha Courser, MD [facility]  If 7PM-7AM, please contact  night-coverage [email or web address]  10/27/2020, 2:07 PM   This record has been created using Conservation officer, historic buildings. Errors have been sought and corrected,but may not always be located. Such creation errors do not reflect on the standard of care.

## 2020-10-27 NOTE — TOC Progression Note (Addendum)
Transition of Care [facility]) - Progression Note    Patient Details  Name: Yeison Sippel MRN: [id number] Date of Birth: 02/13/1941  Transition of Care [facility]) CM/SW Contact  Margarito Liner, LCSW Phone Number: 10/27/2020, 2:07 PM  Clinical Narrative:   No new bed offers. Reviewed those that are still pending with wife. First preference is [facility] due to proximity to their home. Admissions coordinator will review. Wife unsure what second preference will be between [facility] and [facility]. She will consider.  3:57 pm: [facility] and should have one available tomorrow. Insurance authorization started through [employer]. MD has ordered a COVID test. RN aware. Patient is fully vaccinated and got his booster on 06/30/20 per wife.  Expected Discharge Plan and Services                                                 Social Determinants of Health (SDOH) Interventions    Readmission Risk Interventions No flowsheet data found.

## 2020-10-28 ENCOUNTER — Encounter: Payer: Self-pay | Admitting: Internal Medicine

## 2020-10-28 DIAGNOSIS — G20 Parkinson's disease: Secondary | ICD-10-CM

## 2020-10-28 LAB — CULTURE, BLOOD (ROUTINE X 2)
Culture: NO GROWTH
Culture: NO GROWTH
Special Requests: ADEQUATE

## 2020-10-28 LAB — SARS CORONAVIRUS 2 (TAT 6-24 HRS): SARS Coronavirus 2: NEGATIVE

## 2020-10-28 MED ORDER — CLONAZEPAM 1 MG PO TABS: 1.0000 mg | ORAL_TABLET | Freq: Every day | ORAL | 0 refills | Status: DC

## 2020-10-28 MED ORDER — MELATONIN 5 MG PO TABS: 5.0000 mg | ORAL_TABLET | Freq: Every evening | ORAL | 0 refills | Status: AC | PRN

## 2020-10-28 NOTE — Progress Notes (Signed)
EMS called for non-emergent transport. No answer at [facility] to give report. Patient will go to room 611.   Madie Reno, RN

## 2020-10-28 NOTE — Discharge Summary (Addendum)
Physician Discharge Summary  Joshua George MHD:[id number] DOB: [birth year] DOA: 10/20/2020  PCP: Kandyce Rud, MD  Admit date: 10/20/2020 Discharge date: 10/28/2020  Discharge disposition: Skilled nursing facility   Recommendations for Outpatient Follow-Up:   Follow-up with urologist, Dr. Lonna Cobb, in 1 week   Discharge Diagnosis:   Principal Problem:   AKI (acute kidney injury) (HCC) Active Problems:   Hyponatremia   CAP (community acquired pneumonia)   Parapneumonic effusion   Parkinson's disease (HCC)   GERD (gastroesophageal reflux disease)   Protein-calorie malnutrition, severe    Discharge Condition: Stable.  Diet recommendation:  Diet Order            DIET DYS 3           DIET DYS 3 Room service appropriate? Yes with Assist; Fluid consistency: Thin  Diet effective now                   Code Status: DNR     Hospital Course:   Mr. [name] a 80 y.o.malewith medical history significant forParkinson's disease, GERD, BPH and dementia who presented to the hospital because of generalized weakness and poor oral intake for about 1 to 2 weeks duration.  He was recently treated for presumed UTI.  He was found to have acute kidney injury and urinary retention on admission.  More than 2 L of urine was drained from the bladder after placement of Foley catheter.  Acute kidney injury improved after placement of Foley catheter.  AKI was thought to be post renal.  He has had slurred speech since he had lower jaw surgery when part of his jaw and teeth were removed for a growth.  He was also found to have large left-sided pleural effusion and small right pleural effusion.  He underwent left-sided thoracentesis with removal of 1,650 cc of transudative pleural fluid.    CT chest showed bilateral nonobstructing nephrolithiasis, possible right hydronephrosis and left perinephric renal edema.  Urology was consulted and he recommended continuation of indwelling Foley  catheter with outpatient follow-up in about a week after discharge.  He developed fever and lethargy.  He was treated with empiric antibiotics for presumed UTI.  His condition has improved and he is deemed stable for discharge.  PT and OT recommended discharge to SNF.  Discharge plan was discussed with the patient and his wife at the bedside.   Medical Consultants:    Urologist, Dr. Lonna Cobb   Discharge Exam:    Vitals:   10/27/20 2157 10/28/20 0425 10/28/20 1001 10/28/20 1133  BP: 134/87 (!) 146/77 106/64 106/69  Pulse: 72 68 76 77  Resp: 16 20 18 20   Temp: (!) 97.1 F (36.2 C) 98.8 F (37.1 C) 98.3 F (36.8 C) 97.8 F (36.6 C)  TempSrc: Axillary Oral Oral Oral  SpO2: 99% 96% 96% 98%  Weight:      Height:         GEN: NAD SKIN: Warm and dry EYES: EOMI ENT: MMM CV: RRR PULM: CTA B ABD: soft, ND, NT, +BS CNS: AAO x 3, non focal EXT: No edema or tenderness GU: Foley catheter with amber urine   The results of significant diagnostics from this hospitalization (including imaging, microbiology, ancillary and laboratory) are listed below for reference.     Procedures and Diagnostic Studies:   DG Chest 2 View  Result Date: 10/22/2020 CLINICAL DATA:  History of pleural effusion EXAM: CHEST - 2 VIEW COMPARISON:  10/20/2020 FINDINGS: Cardiac shadow is stable. Small posterior  pleural effusions are noted. Increasing interstitial changes noted bilaterally likely representing edema. No pneumothorax is seen. No bony abnormality is noted. IMPRESSION: Changes most consistent with increasing parenchymal edema and small effusions. Electronically Signed   By: Alcide Clever M.D.   On: 10/22/2020 11:47   ECHOCARDIOGRAM COMPLETE  Result Date: 10/22/2020    ECHOCARDIOGRAM REPORT   Patient Name:   [name] Date of Exam: 10/22/2020 Medical Rec #:  [id number]    Height:       68.0 in Accession #:    [id number]   Weight:       132.3 lb Date of Birth:  Apr 24, 1941    BSA:          1.714 m  Patient Age:    80 years     BP:           178/87 mmHg Patient Gender: M            HR:           62 bpm. Exam Location:  [facility] Procedure: 2D Echo, Color Doppler and Cardiac Doppler Indications:     I50.9 Congestive Heart Failure  History:         Patient has no prior history of Echocardiogram examinations.                  Parkinson's disease; Signs/Symptoms:Dementia.  Sonographer:     Humphrey Rolls RDCS (AE) Referring Phys:  [id number] [facility] AMIN Diagnosing Phys: Julien Nordmann MD  Sonographer Comments: No subcostal window. IMPRESSIONS  1. Left ventricular ejection fraction, by estimation, is 60 to 65%. The left ventricle has normal function. The left ventricle has no regional wall motion abnormalities. Left ventricular diastolic parameters are consistent with Grade I diastolic dysfunction (impaired relaxation).  2. Right ventricular systolic function is normal. The right ventricular size is normal.  3. Left atrial size was mildly dilated.  4. Mild to moderate mitral valve regurgitation.  5. Tricuspid valve regurgitation is mild to moderate.  6. Aortic valve regurgitation is mild to moderate. FINDINGS  Left Ventricle: Left ventricular ejection fraction, by estimation, is 60 to 65%. The left ventricle has normal function. The left ventricle has no regional wall motion abnormalities. The left ventricular internal cavity size was normal in size. There is  no left ventricular hypertrophy. Left ventricular diastolic parameters are consistent with Grade I diastolic dysfunction (impaired relaxation). Right Ventricle: The right ventricular size is normal. No increase in right ventricular wall thickness. Right ventricular systolic function is normal. Left Atrium: Left atrial size was mildly dilated. Right Atrium: Right atrial size was normal in size. Pericardium: There is no evidence of pericardial effusion. Mitral Valve: The mitral valve is normal in structure. Mild to moderate mitral valve regurgitation. No evidence of mitral  valve stenosis. MV peak gradient, 2.4 mmHg. The mean mitral valve gradient is 1.0 mmHg. Tricuspid Valve: The tricuspid valve is normal in structure. Tricuspid valve regurgitation is mild to moderate. No evidence of tricuspid stenosis. Aortic Valve: The aortic valve was not well visualized. Aortic valve regurgitation is mild to moderate. Aortic regurgitation PHT measures 310 msec. No aortic stenosis is present. Aortic valve mean gradient measures 5.0 mmHg. Aortic valve peak gradient measures 9.7 mmHg. Aortic valve area, by VTI measures 2.95 cm. Pulmonic Valve: The pulmonic valve was normal in structure. Pulmonic valve regurgitation is not visualized. No evidence of pulmonic stenosis. Aorta: The aortic root is normal in size and structure. Venous: The inferior vena cava is normal in  size with greater than 50% respiratory variability, suggesting right atrial pressure of 3 mmHg. IAS/Shunts: No atrial level shunt detected by color flow Doppler.  LEFT VENTRICLE PLAX 2D LVIDd:         4.40 cm  Diastology LVIDs:         3.00 cm  LV e' medial:    8.92 cm/s LV PW:         1.00 cm  LV E/e' medial:  7.5 LV IVS:        0.70 cm  LV e' lateral:   10.60 cm/s LVOT diam:     2.10 cm  LV E/e' lateral: 6.3 LV SV:         88 LV SV Index:   52 LVOT Area:     3.46 cm  RIGHT VENTRICLE RV Basal diam:  3.30 cm TAPSE (M-mode): 3.4 cm LEFT ATRIUM             Index       RIGHT ATRIUM           Index LA diam:        4.40 cm 2.57 cm/m  RA Area:     10.80 cm LA Vol (A2C):   53.5 ml 31.21 ml/m RA Volume:   22.40 ml  13.07 ml/m LA Vol (A4C):   80.2 ml 46.78 ml/m LA Biplane Vol: 65.7 ml 38.32 ml/m  AORTIC VALVE                    PULMONIC VALVE AV Area (Vmax):    2.69 cm     PV Vmax:       0.98 m/s AV Area (Vmean):   2.87 cm     PV Vmean:      66.200 cm/s AV Area (VTI):     2.95 cm     PV VTI:        0.176 m AV Vmax:           156.00 cm/s  PV Peak grad:  3.8 mmHg AV Vmean:          103.000 cm/s PV Mean grad:  2.0 mmHg AV VTI:             0.299 m AV Peak Grad:      9.7 mmHg AV Mean Grad:      5.0 mmHg LVOT Vmax:         121.00 cm/s LVOT Vmean:        85.300 cm/s LVOT VTI:          0.255 m LVOT/AV VTI ratio: 0.85 AI PHT:            310 msec  AORTA Ao Root diam: 3.80 cm MITRAL VALVE MV Area (PHT): 5.54 cm    SHUNTS MV Area VTI:   5.10 cm    Systemic VTI:  0.26 m MV Peak grad:  2.4 mmHg    Systemic Diam: 2.10 cm MV Mean grad:  1.0 mmHg MV Vmax:       0.78 m/s MV Vmean:      49.6 cm/s MV Decel Time: 137 msec MV E velocity: 67.30 cm/s MV A velocity: 85.30 cm/s MV E/A ratio:  0.79 Julien Nordmannimothy Gollan MD Electronically signed by Julien Nordmannimothy Gollan MD Signature Date/Time: 10/22/2020/3:09:39 PM    Final      Labs:   Basic Metabolic Panel: Recent Labs  Lab 10/22/20 0604 10/23/20 0415 10/23/20 0904 10/24/20 0804 10/26/20 0542  NA 148* 148*  --  147* 139  K 3.5 3.4*  --  3.5 4.0  CL 114* 111  --  109 104  CO2 24 26  --  27 26  GLUCOSE 100* 91  --  100* 89  BUN 19 19  --  24* 20  CREATININE 0.89 0.85  --  0.92 0.75  CALCIUM 7.7* 7.8*  --  7.9* 7.8*  MG  --   --  2.1  --   --    GFR Estimated Creatinine Clearance: 63.5 mL/min (by C-G formula based on SCr of 0.75 mg/dL). Liver Function Tests: No results for input(s): AST, ALT, ALKPHOS, BILITOT, PROT, ALBUMIN in the last 168 hours. No results for input(s): LIPASE, AMYLASE in the last 168 hours. No results for input(s): AMMONIA in the last 168 hours. Coagulation profile No results for input(s): INR, PROTIME in the last 168 hours.  CBC: Recent Labs  Lab 10/22/20 0604 10/23/20 0904 10/25/20 0449 10/26/20 0542  WBC 12.6* 12.3* 13.3* 13.4*  HGB 12.5* 13.3 12.5* 13.7  HCT 37.7* 39.7 39.0 40.7  MCV 90.6 91.3 93.5 92.1  PLT 201 195 186 185   Cardiac Enzymes: No results for input(s): CKTOTAL, CKMB, CKMBINDEX, TROPONINI in the last 168 hours. BNP: Invalid input(s): POCBNP CBG: No results for input(s): GLUCAP in the last 168 hours. D-Dimer No results for input(s): DDIMER in the last  72 hours. Hgb A1c No results for input(s): HGBA1C in the last 72 hours. Lipid Profile No results for input(s): CHOL, HDL, LDLCALC, TRIG, CHOLHDL, LDLDIRECT in the last 72 hours. Thyroid function studies No results for input(s): TSH, T4TOTAL, T3FREE, THYROIDAB in the last 72 hours.  Invalid input(s): FREET3 Anemia work up No results for input(s): VITAMINB12, FOLATE, FERRITIN, TIBC, IRON, RETICCTPCT in the last 72 hours. Microbiology Recent Results (from the past 240 hour(s))  Resp Panel by RT-PCR (Flu A&B, Covid) Nasopharyngeal Swab     Status: None   Collection Time: 10/20/20  8:49 AM   Specimen: Nasopharyngeal Swab; Nasopharyngeal(NP) swabs in vial transport medium  Result Value Ref Range Status   SARS Coronavirus 2 by RT PCR NEGATIVE NEGATIVE Final    Comment: (NOTE) SARS-CoV-2 target nucleic acids are NOT DETECTED.  The SARS-CoV-2 RNA is generally detectable in upper respiratory specimens during the acute phase of infection. The lowest concentration of SARS-CoV-2 viral copies this assay can detect is 138 copies/mL. A negative result does not preclude SARS-Cov-2 infection and should not be used as the sole basis for treatment or other patient management decisions. A negative result may occur with  improper specimen collection/handling, submission of specimen other than nasopharyngeal swab, presence of viral mutation(s) within the areas targeted by this assay, and inadequate number of viral copies(<138 copies/mL). A negative result must be combined with clinical observations, patient history, and epidemiological information. The expected result is Negative.  Fact Sheet for Patients:  BloggerCourse.com  Fact Sheet for Healthcare Providers:  SeriousBroker.it  This test is no t yet approved or cleared by the [place] FDA and  has been authorized for detection and/or diagnosis of SARS-CoV-2 by FDA under an Emergency Use  Authorization (EUA). This EUA will remain  in effect (meaning this test can be used) for the duration of the COVID-19 declaration under Section 564(b)(1) of the Act, 21 U.S.C.section 360bbb-3(b)(1), unless the authorization is terminated  or revoked sooner.       Influenza A by PCR NEGATIVE NEGATIVE Final   Influenza B by PCR NEGATIVE NEGATIVE Final    Comment: (NOTE)  The Xpert Xpress SARS-CoV-2/FLU/RSV plus assay is intended as an aid in the diagnosis of influenza from Nasopharyngeal swab specimens and should not be used as a sole basis for treatment. Nasal washings and aspirates are unacceptable for Xpert Xpress SARS-CoV-2/FLU/RSV testing.  Fact Sheet for Patients: BloggerCourse.com  Fact Sheet for Healthcare Providers: SeriousBroker.it  This test is not yet approved or cleared by the [place] FDA and has been authorized for detection and/or diagnosis of SARS-CoV-2 by FDA under an Emergency Use Authorization (EUA). This EUA will remain in effect (meaning this test can be used) for the duration of the COVID-19 declaration under Section 564(b)(1) of the Act, 21 U.S.C. section 360bbb-3(b)(1), unless the authorization is terminated or revoked.  Performed at [facility] [place], [address], [place], [place] [address]   Urine culture     Status: None   Collection Time: 10/20/20 11:35 AM   Specimen: Urine, Random  Result Value Ref Range Status   Specimen Description   Final    URINE, RANDOM Performed at [facility], [address] [place], [place] [address]    Special Requests   Final    NONE Performed at [facility], [address], [place], [place] [address]    Culture   Final    NO GROWTH Performed at [facility], [address] [place], [place] [address]    Report Status 10/21/2020 FINAL  Final  Body fluid culture     Status: None   Collection Time: 10/20/20  1:30 PM    Specimen: PATH Cytology Pleural fluid  Result Value Ref Range Status   Specimen Description   Final    PLEURAL Performed at [facility], [address], [place], [place] [address]    Special Requests   Final    NONE Performed at [facility], [address] [place], [place] [address]    Gram Stain   Final    MODERATE WBC PRESENT,BOTH PMN AND MONONUCLEAR NO ORGANISMS SEEN    Culture   Final    NO GROWTH 3 DAYS Performed at [facility], [address], [place], [place] [address]    Report Status 10/23/2020 FINAL  Final  CULTURE, BLOOD (ROUTINE X 2) w Reflex to ID Panel     Status: None   Collection Time: 10/23/20  8:51 AM   Specimen: BLOOD  Result Value Ref Range Status   Specimen Description BLOOD BLOOD LEFT ARM  Final   Special Requests   Final    BOTTLES DRAWN AEROBIC AND ANAEROBIC Blood Culture results may not be optimal due to an excessive volume of blood received in culture bottles   Culture   Final    NO GROWTH 5 DAYS Performed at [facility], [address] [place], [place] [address]    Report Status 10/28/2020 FINAL  Final  CULTURE, BLOOD (ROUTINE X 2) w Reflex to ID Panel     Status: None   Collection Time: 10/23/20  8:58 AM   Specimen: BLOOD  Result Value Ref Range Status   Specimen Description BLOOD LEFT ANTECUBITAL  Final   Special Requests   Final    BOTTLES DRAWN AEROBIC AND ANAEROBIC Blood Culture adequate volume   Culture   Final    NO GROWTH 5 DAYS Performed at [facility], [address], [place], [place] [address]    Report Status 10/28/2020 FINAL  Final  Urine Culture     Status: None  Collection Time: 10/23/20  2:00 PM   Specimen: Urine, Random  Result Value Ref Range Status   Specimen Description   Final    URINE, RANDOM Performed at [facility], [address] [place], [place] [address]    Special Requests   Final    NONE Performed at [facility], [address], [place], [place] [address]    Culture   Final    NO GROWTH Performed at [facility], [address] [place], [place] [address]    Report Status 10/25/2020 FINAL  Final  SARS CORONAVIRUS 2 (TAT 6-24 HRS) Nasopharyngeal Nasopharyngeal Swab     Status: None   Collection Time: 10/27/20  6:53 PM   Specimen: Nasopharyngeal Swab  Result Value Ref Range Status   SARS Coronavirus 2 NEGATIVE NEGATIVE Final    Comment: (NOTE) SARS-CoV-2 target nucleic acids are NOT DETECTED.  The SARS-CoV-2 RNA is generally detectable in upper and lower respiratory specimens during the acute phase of infection. Negative results do not preclude SARS-CoV-2 infection, do not rule out co-infections with other pathogens, and should not be used as the sole basis for treatment or other patient management decisions. Negative results must be combined with clinical observations, patient history, and epidemiological information. The expected result is Negative.  Fact Sheet for Patients: HairSlick.no  Fact Sheet for Healthcare Providers: quierodirigir.com  This test is not yet approved or cleared by the [place] FDA and  has been authorized for detection and/or diagnosis of SARS-CoV-2 by FDA under an Emergency Use Authorization (EUA). This EUA will remain  in effect (meaning this test can be used) for the duration of the COVID-19 declaration under Se ction 564(b)(1) of the Act, 21 U.S.C. section 360bbb-3(b)(1), unless the authorization is terminated or revoked sooner.  Performed at [facility], [address] [place], [place] [address]      Discharge Instructions:   Discharge Instructions    DIET DYS 3   Complete by: As directed    Mech Soft diet w/ chopped meats, gravies to moisten(Missing lower Dentition). Thin liquids -- monitor pt Holding Cup Himself when Drinking. General aspiration precautions; support at meals w/ feeding of  foods as needed -- MUST sit fully upright for all po's.  Pills Whole in Puree for safer swallowing.   Fluid consistency: Thin   Increase activity slowly   Complete by: As directed      Allergies as of 10/28/2020   No Known Allergies     Medication List    STOP taking these medications   nitrofurantoin (macrocrystal-monohydrate) 100 MG capsule Commonly known as: MACROBID     TAKE these medications   acetaminophen 325 MG tablet Commonly known as: TYLENOL Take 325 mg by mouth every 6 (six) hours as needed for mild pain.   carbidopa-levodopa-entacapone 37.5-150-200 MG tablet Commonly known as: STALEVO Take 1 tablet by mouth 4 (four) times daily.   clonazePAM 1 MG tablet Commonly known as: KLONOPIN Take 1 tablet (1 mg total) by mouth at bedtime.   glycopyrrolate 1 MG tablet Commonly known as: ROBINUL Take 1 mg by mouth 3 (three) times daily.   Gocovri 137 MG Cp24 Generic drug: Amantadine HCl ER Take 1 capsule by mouth at bedtime.   melatonin 5 MG Tabs Take 1 tablet (5 mg total) by mouth at bedtime as needed. What changed:   how much to take  when to take this  reasons to take this   omeprazole 20 MG  capsule Commonly known as: PRILOSEC Take 20 mg by mouth at bedtime.   polyethylene glycol powder 17 GM/SCOOP powder Commonly known as: GLYCOLAX/MIRALAX Take by mouth.   rasagiline 1 MG Tabs tablet Commonly known as: AZILECT Take 1 mg by mouth daily.   tamsulosin 0.4 MG Caps capsule Commonly known as: FLOMAX Take 0.4 mg by mouth daily with lunch.       Contact information for follow-up providers    Stoioff, Verna Czech, MD. Schedule an appointment as soon as possible for a visit in 1 week(s).   Specialty: Urology Contact information: [address] [place] [address] [phone]            Contact information for after-discharge care    Destination    [facility] Preferred SNF .   Service: Skilled  Nursing Contact information: [address] [place] [address] [phone]                   Time coordinating discharge: 33 minutes  Signed:  Lurene Shadow  [facility] 10/28/2020, 1:24 PM   Pager on [email or web address]. If 7PM-7AM, please contact night-coverage at [email or web address]

## 2020-10-28 NOTE — Plan of Care (Signed)
  Problem: Nutrition: Goal: Adequate nutrition will be maintained Outcome: Progressing   Problem: Elimination: Goal: Will not experience complications related to urinary retention Outcome: Progressing   Problem: Safety: Goal: Ability to remain free from injury will improve Outcome: Progressing   

## 2020-10-28 NOTE — Progress Notes (Signed)
Joshua George to be D/C'd [facility] per MD order.  Discussed prescriptions and follow up appointments with the patient. Prescriptions given to patient, medication list explained in detail. Pt verbalized understanding.  Allergies as of 10/28/2020   No Known Allergies     Medication List    STOP taking these medications   nitrofurantoin (macrocrystal-monohydrate) 100 MG capsule Commonly known as: MACROBID     TAKE these medications   acetaminophen 325 MG tablet Commonly known as: TYLENOL Take 325 mg by mouth every 6 (six) hours as needed for mild pain.   carbidopa-levodopa-entacapone 37.5-150-200 MG tablet Commonly known as: STALEVO Take 1 tablet by mouth 4 (four) times daily.   clonazePAM 1 MG tablet Commonly known as: KLONOPIN Take 1 tablet (1 mg total) by mouth at bedtime.   glycopyrrolate 1 MG tablet Commonly known as: ROBINUL Take 1 mg by mouth 3 (three) times daily.   Gocovri 137 MG Cp24 Generic drug: Amantadine HCl ER Take 1 capsule by mouth at bedtime.   melatonin 5 MG Tabs Take 1 tablet (5 mg total) by mouth at bedtime as needed. What changed:   how much to take  when to take this  reasons to take this   omeprazole 20 MG capsule Commonly known as: PRILOSEC Take 20 mg by mouth at bedtime.   polyethylene glycol powder 17 GM/SCOOP powder Commonly known as: GLYCOLAX/MIRALAX Take by mouth.   rasagiline 1 MG Tabs tablet Commonly known as: AZILECT Take 1 mg by mouth daily.   tamsulosin 0.4 MG Caps capsule Commonly known as: FLOMAX Take 0.4 mg by mouth daily with lunch.       Vitals:   10/28/20 1133 10/28/20 1516  BP: 106/69 114/70  Pulse: 77 81  Resp: 20 18  Temp: 97.8 F (36.6 C) 98.4 F (36.9 C)  SpO2: 98% 97%    Skin clean, dry and intact without evidence of skin break down, no evidence of skin tears noted. IV catheter discontinued intact. Site without signs and symptoms of complications. Dressing and pressure applied. Pt denies pain at  this time. No complaints noted.  An After Visit Summary was printed and given to the patient. Patient escorted via EMS to [facility].  Madie Reno RN

## 2020-10-28 NOTE — TOC Transition Note (Signed)
Transition of Care [facility]) - CM/SW Discharge Note   Patient Details  Name: Joshua George MRN: [id number] Date of Birth: November 29, 1940  Transition of Care [facility]) CM/SW Contact:  Margarito Liner, LCSW Phone Number: 10/28/2020, 12:43 PM   Clinical Narrative:  Patient has orders to discharge to [facility] today. RN will call report to [phone] (Room 611A). EMS transport set up for 1:30. No further concerns. CSW signing off.   Final next level of care: Skilled Nursing Facility Barriers to Discharge: Barriers Resolved   Patient Goals and CMS Choice     Choice offered to / list presented to : Spouse  Discharge Placement PASRR number recieved: 10/27/20            Patient chooses bed at: [facility] [address] Patient to be transferred to facility by: EMS Name of family member notified: Crockett Rallo Patient and family notified of of transfer: 10/28/20  Discharge Plan and Services                                     Social Determinants of Health (SDOH) Interventions     Readmission Risk Interventions No flowsheet data found.

## 2020-10-28 NOTE — TOC Progression Note (Signed)
Transition of Care [facility]) - Progression Note    Patient Details  Name: Joshua George MRN: [id number] Date of Birth: Oct 31, 1940  Transition of Care [facility]) CM/SW Contact  Margarito Liner, LCSW Phone Number: 10/28/2020, 8:48 AM  Clinical Narrative: Authorization number obtained: [id number]. COVID test from yesterday is negative. [facility] confirmed they can accept patient today. Sent secure chat to MD to notify.    Expected Discharge Plan and Services                                                 Social Determinants of Health (SDOH) Interventions    Readmission Risk Interventions No flowsheet data found.

## 2020-11-12 ENCOUNTER — Ambulatory Visit: Payer: Medicare PPO | Admitting: Urology

## 2020-11-12 ENCOUNTER — Other Ambulatory Visit: Payer: Self-pay

## 2020-11-12 ENCOUNTER — Encounter: Payer: Self-pay | Admitting: Urology

## 2020-11-12 VITALS — BP 106/73 | HR 82 | Ht 71.0 in | Wt 135.0 lb

## 2020-11-12 DIAGNOSIS — R339 Retention of urine, unspecified: Secondary | ICD-10-CM | POA: Diagnosis not present

## 2020-11-12 LAB — BLADDER SCAN AMB NON-IMAGING

## 2020-11-12 MED ORDER — CIPROFLOXACIN HCL 500 MG PO TABS
500.0000 mg | ORAL_TABLET | Freq: Once | ORAL | Status: AC
  Administered 2020-11-12: 500 mg via ORAL

## 2020-11-12 NOTE — Addendum Note (Signed)
Addended by: Martha Clan on: 11/12/2020 01:17 PM   Modules accepted: Orders

## 2020-11-12 NOTE — Progress Notes (Signed)
11/12/2020 8:46 AM   Tuck Guise Nov 02, 1940 [id number]  Referring provider: Kandyce Rud, MD [address] [place],  [place] [address]  Chief Complaint  Patient presents with  . Urinary Retention    HPI: Joshua George is a 80 y.o. male who presents for hospital follow-up of urinary retention.   History of Parkinson's disease and dementia who was admitted to [facility] proximately 1 month ago with confusion and agitation.  Creatinine was 8.87 and noted to have bladder distention with 1800 mL of urine obtained after Foley catheter placement.  Renal ultrasound showed no hydronephrosis  Creatinine normalize at <1.0   PMH: Past Medical History:  Diagnosis Date  . BPH (benign prostatic hyperplasia)   . Dementia (HCC)   . GERD (gastroesophageal reflux disease)   . Parkinson's disease [facility])     Surgical History: Past Surgical History:  Procedure Laterality Date  . TONSILLECTOMY      Home Medications:  Allergies as of 11/12/2020   No Known Allergies     Medication List       Accurate as of November 12, 2020  8:46 AM. If you have any questions, ask your nurse or doctor.        acetaminophen 325 MG tablet Commonly known as: TYLENOL Take 325 mg by mouth every 6 (six) hours as needed for mild pain.   carbidopa-levodopa-entacapone 37.5-150-200 MG tablet Commonly known as: STALEVO Take 1 tablet by mouth 4 (four) times daily.   clonazePAM 1 MG tablet Commonly known as: KLONOPIN Take 1 tablet (1 mg total) by mouth at bedtime.   glycopyrrolate 1 MG tablet Commonly known as: ROBINUL Take 1 mg by mouth 3 (three) times daily.   Gocovri 137 MG Cp24 Generic drug: Amantadine HCl ER Take 1 capsule by mouth at bedtime.   melatonin 5 MG Tabs Take 1 tablet (5 mg total) by mouth at bedtime as needed.   omeprazole 20 MG capsule Commonly known as: PRILOSEC Take 20 mg by mouth at bedtime.   polyethylene glycol powder 17  GM/SCOOP powder Commonly known as: GLYCOLAX/MIRALAX Take by mouth.   rasagiline 1 MG Tabs tablet Commonly known as: AZILECT Take 1 mg by mouth daily.   tamsulosin 0.4 MG Caps capsule Commonly known as: FLOMAX Take 0.4 mg by mouth daily with lunch.       Allergies: No Known Allergies  Family History: History reviewed. No pertinent family history.  Social History:  reports that he has never smoked. He has never used smokeless tobacco. He reports previous alcohol use. No history on file for drug use.   Physical Exam: BP 106/73   Pulse 82   Ht 5\' 11"  (1.803 m)   Wt 135 lb (61.2 kg)   BMI 18.83 kg/m   Constitutional:  Alert, No acute distress. HEENT: [place] AT, moist mucus membranes.  Trachea midline, no masses. Cardiovascular: No clubbing, cyanosis, or edema. Respiratory: Normal respiratory effort, no increased work of breathing. GU: Foley catheter draining clear urine  Assessment & Plan:    1.  Urinary retention  Significant urine volume noted on prior catheter replacement and there is a concern for a hypotonic bladder.  Catheter removed this morning and he has a follow-up this afternoon with for a bladder scan  If emptying adequately this afternoon would recommend a follow-up creatinine and bladder scan mid next week   Carollee Herter, MD  [facility] [address] [place],  [place] [address] [phone]

## 2020-11-12 NOTE — Addendum Note (Signed)
Addended by: Milas Kocher A on: 11/12/2020 01:19 PM   Modules accepted: Orders

## 2020-11-14 ENCOUNTER — Other Ambulatory Visit: Payer: Self-pay

## 2020-11-14 ENCOUNTER — Emergency Department
Admission: EM | Admit: 2020-11-14 | Discharge: 2020-11-14 | Disposition: A | Discharge status: Home / Self Care | Payer: Medicare PPO | Attending: Emergency Medicine | Admitting: Emergency Medicine

## 2020-11-14 ENCOUNTER — Encounter: Payer: Self-pay | Admitting: Emergency Medicine

## 2020-11-14 DIAGNOSIS — G20 Parkinson's disease: Secondary | ICD-10-CM | POA: Insufficient documentation

## 2020-11-14 DIAGNOSIS — R410 Disorientation, unspecified: Secondary | ICD-10-CM | POA: Insufficient documentation

## 2020-11-14 DIAGNOSIS — R338 Other retention of urine: Secondary | ICD-10-CM

## 2020-11-14 DIAGNOSIS — F0390 Unspecified dementia without behavioral disturbance: Secondary | ICD-10-CM | POA: Insufficient documentation

## 2020-11-14 DIAGNOSIS — R39198 Other difficulties with micturition: Secondary | ICD-10-CM | POA: Diagnosis present

## 2020-11-14 DIAGNOSIS — R339 Retention of urine, unspecified: Secondary | ICD-10-CM | POA: Insufficient documentation

## 2020-11-14 LAB — BASIC METABOLIC PANEL
Anion gap: 10 (ref 5–15)
BUN: 28 mg/dL — ABNORMAL HIGH (ref 8–23)
CO2: 25 mmol/L (ref 22–32)
Calcium: 9.1 mg/dL (ref 8.9–10.3)
Chloride: 100 mmol/L (ref 98–111)
Creatinine, Ser: 0.87 mg/dL (ref 0.61–1.24)
GFR, Estimated: >60 mL/min (ref >60)
Glucose, Bld: 110 mg/dL — ABNORMAL HIGH (ref 70–99)
Potassium: 4.3 mmol/L (ref 3.5–5.1)
Sodium: 135 mmol/L (ref 135–145)

## 2020-11-14 LAB — CBC
HCT: 39.4 % (ref 39.0–52.0)
Hemoglobin: 12.8 g/dL — ABNORMAL LOW (ref 13.0–17.0)
MCH: 30 pg (ref 26.0–34.0)
MCHC: 32.5 g/dL (ref 30.0–36.0)
MCV: 92.5 fL (ref 80.0–100.0)
Platelets: 184 10*3/uL (ref 150–400)
RBC: 4.26 MIL/uL (ref 4.22–5.81)
RDW: 12.7 % (ref 11.5–15.5)
WBC: 10.2 10*3/uL (ref 4.0–10.5)
nRBC: 0 % (ref 0.0–0.2)

## 2020-11-14 LAB — URINALYSIS, COMPLETE (UACMP) WITH MICROSCOPIC
Bacteria, UA: NONE SEEN
Bilirubin Urine: NEGATIVE
Glucose, UA: NEGATIVE mg/dL
Ketones, ur: 5 mg/dL — AB
Leukocytes,Ua: NEGATIVE
Nitrite: NEGATIVE
Protein, ur: NEGATIVE mg/dL
Specific Gravity, Urine: 1.014 (ref 1.005–1.030)
Squamous Epithelial / HPF: NONE SEEN (ref 0–5)
pH: 5 (ref 5.0–8.0)

## 2020-11-14 NOTE — ED Notes (Signed)
Bladder scan , 

## 2020-11-14 NOTE — ED Provider Notes (Signed)
[facility] Emergency Department Provider Note  ____________________________________________  Time seen: Approximately 10:51 PM  I have reviewed the triage vital signs and the nursing notes.   HISTORY  Chief Complaint Urinary Retention and Altered Mental Status    Level 5 Caveat: Portions of the History and Physical including HPI and review of systems are unable to be completely obtained due to patient being a poor historian   HPI Joshua George is a 80 y.o. male with a history of BPH dementia Parkinson's disease and GERD who is brought to the ED today due to confusion and difficulty urinating.  Patient had a Foley catheter for urinary retention which was removed 2 days ago prior to his discharge from [facility] this morning.  He has had very little urine output since then.  Eating and drinking and taking his medications.  No fever.  No falls or trauma.      Past Medical History:  Diagnosis Date  . BPH (benign prostatic hyperplasia)   . Dementia (HCC)   . GERD (gastroesophageal reflux disease)   . Parkinson's disease [facility])      Patient Active Problem List   Diagnosis Date Noted  . Protein-calorie malnutrition, severe 10/26/2020  . AKI (acute kidney injury) (HCC) 10/20/2020  . Hyponatremia 10/20/2020  . CAP (community acquired pneumonia) 10/20/2020  . Parapneumonic effusion 10/20/2020  . Parkinson's disease (HCC)   . GERD (gastroesophageal reflux disease)      Past Surgical History:  Procedure Laterality Date  . TONSILLECTOMY       Prior to Admission medications   Medication Sig Start Date End Date Taking? Authorizing Provider  acetaminophen (TYLENOL) 325 MG tablet Take 325 mg by mouth every 6 (six) hours as needed for mild pain. 08/21/19   [provider]  carbidopa-levodopa-entacapone (STALEVO) 37.5-150-200 MG tablet Take 1 tablet by mouth 4 (four) times daily. 10/09/20   [provider]  clonazePAM (KLONOPIN) 1 MG tablet  Take 1 tablet (1 mg total) by mouth at bedtime. 10/28/20   Lurene Shadow, MD  glycopyrrolate (ROBINUL) 1 MG tablet Take 1 mg by mouth 3 (three) times daily. 09/24/20   [provider]  GOCOVRI 137 MG CP24 Take 1 capsule by mouth at bedtime. 09/30/20   [provider]  melatonin 5 MG TABS Take 1 tablet (5 mg total) by mouth at bedtime as needed. 10/28/20   Lurene Shadow, MD  omeprazole (PRILOSEC) 20 MG capsule Take 20 mg by mouth at bedtime. 08/21/20   [provider]  polyethylene glycol powder (GLYCOLAX/MIRALAX) 17 GM/SCOOP powder Take by mouth.    [provider]  rasagiline (AZILECT) 1 MG TABS tablet Take 1 mg by mouth daily. 08/11/20   [provider]  tamsulosin (FLOMAX) 0.4 MG CAPS capsule Take 0.4 mg by mouth daily with lunch. 08/11/20   [provider]     Allergies Patient has no known allergies.   No family history on file.  Social History Social History   Tobacco Use  . Smoking status: Never Smoker  . Smokeless tobacco: Never Used  Substance Use Topics  . Alcohol use: Not Currently    Review of Systems Level 5 Caveat: Portions of the History and Physical including HPI and review of systems are unable to be completely obtained due to patient being a poor historian   Constitutional:   No known fever.  ENT:   No rhinorrhea. Cardiovascular:   No chest pain or syncope. Respiratory:   No dyspnea or cough.  Gastrointestinal:   Negative for abdominal pain, vomiting and diarrhea.  Musculoskeletal:   Negative for focal pain or swelling ____________________________________________   PHYSICAL EXAM:  VITAL SIGNS: ED Triage Vitals  Enc Vitals Group     BP 11/14/20 1908 133/89     Pulse Rate 11/14/20 1908 84     Resp 11/14/20 1908 20     Temp 11/14/20 1908 98.2 F (36.8 C)     Temp Source 11/14/20 1908 Oral     SpO2 11/14/20 1908 98 %     Weight 11/14/20 1908 135 lb (61.2 kg)     Height 11/14/20 1908 5\' 7"  (1.702 m)      Head Circumference --      [facility] --      Pain Score 11/14/20 1909 7     Pain Loc --      Pain Edu? --      Excl. in GC? --     Vital signs reviewed, nursing assessments reviewed.   Constitutional:   Alert and oriented. Non-toxic appearance. Eyes:   Conjunctivae are normal. EOMI. PERRL. ENT      Head:   Normocephalic and atraumatic.      Nose:   No congestion/rhinnorhea.       Mouth/Throat:   MMM, no pharyngeal erythema. No peritonsillar mass.       Neck:   No meningismus. Full ROM. Hematological/Lymphatic/Immunilogical:   No cervical lymphadenopathy. Cardiovascular:   RRR. Symmetric bilateral radial and DP pulses.  No murmurs. Cap refill less than 2 seconds. Respiratory:   Normal respiratory effort without tachypnea/retractions. Breath sounds are clear and equal bilaterally. No wheezes/rales/rhonchi. Gastrointestinal:   Soft and nontender.  Bladder is palpable above the umbilicus, markedly distended. Musculoskeletal:   Normal range of motion in all extremities. No joint effusions.  No lower extremity tenderness.  No edema. Neurologic:   Nonverbal. Motor grossly intact. No acute focal neurologic deficits are appreciated.  Skin:    Skin is warm, dry and intact. No rash noted.  No petechiae, purpura, or bullae.  ____________________________________________    LABS (pertinent positives/negatives) (all labs ordered are listed, but only abnormal results are displayed) Labs Reviewed  URINALYSIS, COMPLETE (UACMP) WITH MICROSCOPIC - Abnormal; Notable for the following components:      Result Value   Color, Urine AMBER (*)    APPearance CLEAR (*)    Hgb urine dipstick MODERATE (*)    Ketones, ur 5 (*)    All other components within normal limits  BASIC METABOLIC PANEL - Abnormal; Notable for the following components:   Glucose, Bld 110 (*)    BUN 28 (*)    All other components within normal limits  CBC - Abnormal; Notable for the following components:   Hemoglobin 12.8 (*)     All other components within normal limits   ____________________________________________   EKG    ____________________________________________    RADIOLOGY  No results found.  ____________________________________________   PROCEDURES Procedures  ____________________________________________    CLINICAL IMPRESSION / ASSESSMENT AND PLAN / ED COURSE  Medications ordered in the ED: Medications - No data to display  Pertinent labs & imaging results that were available during my care of the patient were reviewed by me and considered in my medical decision making (see chart for details).   Joie Alsobrook was evaluated in Emergency Department on 11/14/2020 for the symptoms described in the history of present illness. He was evaluated in the context of the global COVID-19 pandemic, which necessitated consideration  that the patient might be at risk for infection with the SARS-CoV-2 virus that causes COVID-19. Institutional protocols and algorithms that pertain to the evaluation of patients at risk for COVID-19 are in a state of rapid change based on information released by regulatory bodies including the CDC and federal and state organizations. These policies and algorithms were followed during the patient's care in the ED.   Patient presents with clinically apparent urinary retention.  Vital signs are normal, labs are normal, no signs of infection or renal failure.  Foley catheter inserted with output of 1500 mL.  Will discharge with Foley, follow-up with urology.      ____________________________________________   FINAL CLINICAL IMPRESSION(S) / ED DIAGNOSES    Final diagnoses:  Acute urinary retention     ED Discharge Orders    None      Portions of this note were generated with dragon dictation software. Dictation errors may occur despite best attempts at proofreading.   Sharman Cheek, MD 11/14/20 2252

## 2020-11-14 NOTE — ED Notes (Signed)
Pt and pts wife taught how to use leg bag and drainage bag. No further questions at this time.

## 2020-11-14 NOTE — ED Triage Notes (Signed)
Pt to ER from with accompanied by wife who reports pt was discharged from [facility] this morning, reports pt is confused and has not emptied his bladder. Pt seems uncomfortable reports "medium pain". Pt has Parkinson's disease.

## 2020-11-14 NOTE — ED Notes (Signed)
Pt attempted to use urinal unable to void at present

## 2020-11-16 ENCOUNTER — Telehealth: Payer: Self-pay

## 2020-11-16 NOTE — Telephone Encounter (Signed)
Contacted patient to follow up on after hours line which states patient unable to void and to go to ED. Patient did go to ED and cath was replaced due to urinary retention.  Per Michiel Cowboy patient should keep foley for 1 month. Patient was notified and his follow up appointment was rescheduled. Patient would like to discuss possible cath removal/ voiding trial again at next visit.

## 2020-11-18 ENCOUNTER — Ambulatory Visit: Payer: Self-pay | Admitting: Urology

## 2020-12-01 ENCOUNTER — Telehealth: Payer: Self-pay

## 2020-12-01 NOTE — Telephone Encounter (Signed)
Incoming call from pt's wife who states that beginning yesterday evening the pt started to get a fever. At the highest the temp reaching 101.9. The fever gradually decreased throughout the night and is now back down to normal at 97.5. Patient denies pain, nausea, and vomiting. Urine is flowing well into the leg bag .Urine is clear. Wife states she called PCP and told them about the fever and they recommended that she call us to make [place] aware. Please advise.

## 2020-12-01 NOTE — Telephone Encounter (Signed)
Recommend PA visit for urine culture and vitals/temp check

## 2020-12-02 NOTE — Telephone Encounter (Signed)
Talked to patient wife. She states Joshua George is doing much better and no more fever. Often them an appt. Wife states she will call if she needs [place]

## 2020-12-08 ENCOUNTER — Other Ambulatory Visit: Payer: Self-pay

## 2020-12-08 ENCOUNTER — Telehealth: Payer: Self-pay

## 2020-12-08 ENCOUNTER — Encounter: Payer: Self-pay | Admitting: Physician Assistant

## 2020-12-08 ENCOUNTER — Ambulatory Visit (INDEPENDENT_AMBULATORY_CARE_PROVIDER_SITE_OTHER): Payer: Medicare PPO | Admitting: Physician Assistant

## 2020-12-08 VITALS — BP 106/62 | HR 84 | Temp 98.3°F | Ht 66.0 in | Wt 135.0 lb

## 2020-12-08 DIAGNOSIS — R509 Fever, unspecified: Secondary | ICD-10-CM | POA: Diagnosis not present

## 2020-12-08 LAB — URINALYSIS, COMPLETE
Bilirubin, UA: NEGATIVE
Nitrite, UA: NEGATIVE
Specific Gravity, UA: 1.02 (ref 1.005–1.030)
Urobilinogen, Ur: 0.2 mg/dL (ref 0.2–1.0)
pH, UA: 7 (ref 5.0–7.5)

## 2020-12-08 LAB — MICROSCOPIC EXAMINATION: WBC, UA: >30 /hpf — AB (ref 0–5)

## 2020-12-08 MED ORDER — CEFTRIAXONE SODIUM 1 G IJ SOLR
1.0000 g | Freq: Once | INTRAMUSCULAR | Status: AC
  Administered 2020-12-08: 1 g via INTRAMUSCULAR

## 2020-12-08 MED ORDER — SULFAMETHOXAZOLE-TRIMETHOPRIM 800-160 MG PO TABS
1.0000 | ORAL_TABLET | Freq: Two times a day (BID) | ORAL | 0 refills | Status: AC
Start: 2020-12-08 — End: 2020-12-15

## 2020-12-08 NOTE — Progress Notes (Signed)
Cath Change/ Replacement  Patient is present today for a catheter change due to urinary retention.  69ml of water was removed from the balloon, a 16FR coude foley cath was removed without difficulty.  Patient was cleaned and prepped in a sterile fashion with betadine and 2% lidocaine jelly was instilled into the urethra. A 16 FR coude foley cath was replaced into the bladder no complications were noted Urine return was noted 60ml and urine was orange in color. The balloon was filled with 76ml of sterile water. A night bag was attached for drainage.  Patient tolerated well.   Performed by: Carman Ching, PA-C and Eligha Bridegroom, CMA

## 2020-12-08 NOTE — Progress Notes (Signed)
12/08/2020 11:58 AM   Joshua George 07-Jul-1941 [id number]  CC: Chief Complaint  Patient presents with  . Urinary Tract Infection    HPI: Joshua George is a 80 y.o. male with PMH Parkinson's disease, dementia, and urinary retention with a Foley catheter in place who presents today for evaluation of possible UTI.  He is scheduled for Foley catheter replacement versus possible removal with Michiel Cowboy next week.  He is accompanied today by his wife, who contributes to HPI.  Today he reports intermittent fever over the past week.  This resolved several days ago but resumed overnight, T-max 100.5 F.  He also reports generalized malaise and hip pain.  He denies nausea, vomiting, and recent falls.  He took Tylenol this morning at home.  Foley catheter was plugged in clinic to obtain urine sample.  In-office UA today positive for trace glucose, 1+ ketones, trace intact blood, 2+ protein, and trace leukocyte esterase; urine microscopy with >30 WBCs/HPF, 3-10 RBCs/HPF, granular casts, calcium oxalate and amorphous crystals, and moderate bacteria.  PMH: Past Medical History:  Diagnosis Date  . BPH (benign prostatic hyperplasia)   . Dementia (HCC)   . GERD (gastroesophageal reflux disease)   . Parkinson's disease [facility])     Surgical History: Past Surgical History:  Procedure Laterality Date  . TONSILLECTOMY      Home Medications:  Allergies as of 12/08/2020   No Known Allergies     Medication List       Accurate as of December 08, 2020 11:58 AM. If you have any questions, ask your nurse or doctor.        acetaminophen 325 MG tablet Commonly known as: TYLENOL Take 325 mg by mouth every 6 (six) hours as needed for mild pain.   carbidopa-levodopa-entacapone 37.5-150-200 MG tablet Commonly known as: STALEVO Take 1 tablet by mouth 4 (four) times daily.   clonazePAM 1 MG tablet Commonly known as: KLONOPIN Take 1 tablet (1 mg total) by mouth at bedtime.   glycopyrrolate 1 MG  tablet Commonly known as: ROBINUL Take 1 mg by mouth 3 (three) times daily.   Gocovri 137 MG Cp24 Generic drug: Amantadine HCl ER Take 1 capsule by mouth at bedtime.   melatonin 5 MG Tabs Take 1 tablet (5 mg total) by mouth at bedtime as needed.   omeprazole 20 MG capsule Commonly known as: PRILOSEC Take 20 mg by mouth at bedtime.   polyethylene glycol powder 17 GM/SCOOP powder Commonly known as: GLYCOLAX/MIRALAX Take by mouth.   rasagiline 1 MG Tabs tablet Commonly known as: AZILECT Take 1 mg by mouth daily.   tamsulosin 0.4 MG Caps capsule Commonly known as: FLOMAX Take 0.4 mg by mouth daily with lunch.       Allergies:  No Known Allergies  Family History: No family history on file.  Social History:   reports that he has never smoked. He has never used smokeless tobacco. He reports previous alcohol use. No history on file for drug use.  Physical Exam: BP 106/62   Pulse 84   Temp 98.3 F (36.8 C) (Oral)   Ht 5\' 6"  (1.676 m)   Wt 135 lb (61.2 kg)   SpO2 99%   BMI 21.79 kg/m   Constitutional:  Alert, no acute distress, nontoxic appearing HEENT: [place], AT Cardiovascular: No clubbing, cyanosis, or edema Respiratory: Normal respiratory effort, no increased work of breathing Skin: No rashes, bruises or suspicious lesions Neurologic: Grossly intact, no focal deficits, moving all 4 extremities Psychiatric: Normal  mood and affect  Laboratory Data: Results for orders placed or performed in visit on 12/08/20  Microscopic Examination   Urine  Result Value Ref Range   WBC, UA >30 (A) 0 - 5 /hpf   RBC 3-10 (A) 0 - 2 /hpf   Epithelial Cells (non renal) 0-10 0 - 10 /hpf   Casts Present (A) None seen /lpf   Cast Type Granular casts (A) N/A   Crystals Present (A) N/A   Crystal Type Calcium Oxalate N/A   Mucus, UA Present (A) Not Estab.   Bacteria, UA Moderate (A) None seen/Few  Urinalysis, Complete  Result Value Ref Range   Specific Gravity, UA 1.020 1.005 - 1.030    pH, UA 7.0 5.0 - 7.5   Color, UA Orange Yellow   Appearance Ur Cloudy (A) Clear   Leukocytes,UA Trace (A) Negative   Protein,UA 2+ (A) Negative/Trace   Glucose, UA Trace (A) Negative   Ketones, UA 1+ (A) Negative   RBC, UA Trace (A) Negative   Bilirubin, UA Negative Negative   Urobilinogen, Ur 0.2 0.2 - 1.0 mg/dL   Nitrite, UA Negative Negative   Microscopic Examination See below:    Assessment & Plan:   1. Fever, unspecified fever cause Elevated temp and malaise.  UA consistent with UTI versus chronic Foley catheterization.  VSS, though BP is soft today.  Will treat with 1 dose of IM ceftriaxone and oral Bactrim for empiric treatment of possible UTI and send for urine culture for further evaluation.  Counseled patient and his wife to follow-up with PCP regarding hip pain or with worsening fever.  They expressed understanding.  After administering 1 dose IM ceftriaxone in clinic, I exchanged his Foley catheter, see separate procedure note for details. - Urinalysis, Complete - CULTURE, URINE COMPREHENSIVE - cefTRIAXone (ROCEPHIN) injection 1 g - sulfamethoxazole-trimethoprim (BACTRIM DS) 800-160 MG tablet; Take 1 tablet by mouth 2 (two) times daily for 7 days.  Dispense: 14 tablet; Refill: 0   Return in about 4 weeks (around 01/05/2021) for Catheter exchange vs possible removal with Carollee Herter.  Carman Ching, PA-C  [facility] [address] [place], [place] [address] [phone]

## 2020-12-08 NOTE — Telephone Encounter (Signed)
Incoming call from pt's wife who states that overnight and into this morning the patient has had a low grade fever (99.0) and overall malaise. Denies n/v and any pain. Of note wife called in last week with similar complaints and was advised by Dr. Lonna Cobb to schedule an appointment. Appt scheduled for today. Wife gave verbal understanding.

## 2020-12-08 NOTE — Progress Notes (Signed)
IM Injection  Patient is present today for an IM Injection Drug: Rocephin Dose:1GM Location: Left upper outer buttocks Lot: [id number] Exp: 04/11/2023 Patient tolerated well, no complications were noted  Preformed by: Eligha Bridegroom, CMA

## 2020-12-12 LAB — CULTURE, URINE COMPREHENSIVE

## 2020-12-15 ENCOUNTER — Ambulatory Visit: Payer: Self-pay | Admitting: Urology

## 2020-12-29 ENCOUNTER — Other Ambulatory Visit: Payer: Self-pay

## 2020-12-29 ENCOUNTER — Emergency Department: Payer: Medicare PPO

## 2020-12-29 ENCOUNTER — Emergency Department
Admission: EM | Admit: 2020-12-29 | Discharge: 2020-12-29 | Disposition: A | Discharge status: Home / Self Care | Payer: Medicare PPO | Attending: Emergency Medicine | Admitting: Emergency Medicine

## 2020-12-29 DIAGNOSIS — T83091A Other mechanical complication of indwelling urethral catheter, initial encounter: Secondary | ICD-10-CM | POA: Insufficient documentation

## 2020-12-29 DIAGNOSIS — W1839XA Other fall on same level, initial encounter: Secondary | ICD-10-CM | POA: Diagnosis not present

## 2020-12-29 DIAGNOSIS — G20 Parkinson's disease: Secondary | ICD-10-CM | POA: Insufficient documentation

## 2020-12-29 DIAGNOSIS — T148XXA Other injury of unspecified body region, initial encounter: Secondary | ICD-10-CM

## 2020-12-29 DIAGNOSIS — S41112A Laceration without foreign body of left upper arm, initial encounter: Secondary | ICD-10-CM | POA: Diagnosis not present

## 2020-12-29 DIAGNOSIS — Y92009 Unspecified place in unspecified non-institutional (private) residence as the place of occurrence of the external cause: Secondary | ICD-10-CM | POA: Diagnosis not present

## 2020-12-29 DIAGNOSIS — R339 Retention of urine, unspecified: Secondary | ICD-10-CM | POA: Diagnosis not present

## 2020-12-29 DIAGNOSIS — W19XXXA Unspecified fall, initial encounter: Secondary | ICD-10-CM

## 2020-12-29 DIAGNOSIS — S3730XA Unspecified injury of urethra, initial encounter: Secondary | ICD-10-CM | POA: Diagnosis present

## 2020-12-29 DIAGNOSIS — F0390 Unspecified dementia without behavioral disturbance: Secondary | ICD-10-CM | POA: Insufficient documentation

## 2020-12-29 DIAGNOSIS — S61412A Laceration without foreign body of left hand, initial encounter: Secondary | ICD-10-CM | POA: Insufficient documentation

## 2020-12-29 DIAGNOSIS — Y846 Urinary catheterization as the cause of abnormal reaction of the patient, or of later complication, without mention of misadventure at the time of the procedure: Secondary | ICD-10-CM | POA: Insufficient documentation

## 2020-12-29 DIAGNOSIS — T839XXA Unspecified complication of genitourinary prosthetic device, implant and graft, initial encounter: Secondary | ICD-10-CM | POA: Diagnosis not present

## 2020-12-29 DIAGNOSIS — Z23 Encounter for immunization: Secondary | ICD-10-CM | POA: Diagnosis not present

## 2020-12-29 MED ORDER — TETANUS-DIPHTH-ACELL PERTUSSIS 5-2.5-18.5 LF-MCG/0.5 IM SUSY
0.5000 mL | PREFILLED_SYRINGE | Freq: Once | INTRAMUSCULAR | Status: AC
  Administered 2020-12-29: 0.5 mL via INTRAMUSCULAR
  Filled 2020-12-29: qty 0.5

## 2020-12-29 MED ORDER — BACITRACIN ZINC 500 UNIT/GM EX OINT
TOPICAL_OINTMENT | Freq: Once | CUTANEOUS | Status: AC
  Filled 2020-12-29: qty 0.9

## 2020-12-29 NOTE — Discharge Instructions (Addendum)
Please seek medical attention for any high fevers, chest pain, shortness of breath, change in behavior, persistent vomiting, bloody stool or any other new or concerning symptoms.  

## 2020-12-29 NOTE — ED Notes (Signed)
Pt has been provided with discharge instructions. Pt denies any questions or concerns at this time. Pt verbalizes understanding for follow up care and d/c.  VSS.  Pt left department with all belongings.  

## 2020-12-29 NOTE — ED Notes (Signed)
MD notified of rectal temp. Pt has been given warm blankets

## 2020-12-29 NOTE — ED Notes (Signed)
RN attempted foley. Pt is having clots come out from previous foley trauma. Waiting for urology consult at this time.

## 2020-12-29 NOTE — ED Triage Notes (Addendum)
Pt states earlier tonight his urinary catheter got pulled put on accident when he fall, pt denies any pain. Pt states he initially was bleeding from penis states bleeding is controlled now. Pt denies any pain from fall, pt has a few skin tears to left arm from fall.

## 2020-12-29 NOTE — ED Provider Notes (Signed)
[facility] Emergency Department Provider Note  ____________________________________________   Event Date/Time   First MD Initiated Contact with Patient 12/29/20 [phone]     (approximate)  I have reviewed the triage vital signs and the nursing notes.   HISTORY  Chief Complaint urinary catheter problem    HPI Joshua George is a 80 y.o. male with history of dementia, Parkinson's disease, BPH who presents to the emergency department after he had a fall at home and pulled out his urinary Foley catheter accidentally.  Did have bleeding from the penis that has resolved.  He states he is not sure why he fell but denies any preceding chest pain, dizziness, palpitations.  Did not hit his head per his report but wife reports first fall was unwitnessed.  She states he had another fall that was witnessed but she does not believe he hit his head during that fall.  States he just fell backwards onto carpeted flooring.  Not on blood thinners.  Denies neck or back pain.  No numbness or weakness.  No recent fevers, cough, vomiting or diarrhea.  Wife reports it is not abnormal for him to fall given his history of Parkinson's and that he is often does not use his walker like he should.  She states he has had catheter placed recurrently over the past several weeks due to urinary retention from BPH.  This catheter was placed again about 3 weeks ago and he is scheduled to see his urologist to have it potentially removed in 1 week.  Wife denies any other acute complaints.  She states he was recently taken off of the medication by his neurologist that could contribute to urinary retention about 3 weeks ago.  He also had 1 new additional medication added.        Past Medical History:  Diagnosis Date  . BPH (benign prostatic hyperplasia)   . Dementia (HCC)   . GERD (gastroesophageal reflux disease)   . Parkinson's disease [facility])     Patient Active Problem List   Diagnosis Date Noted  .  Protein-calorie malnutrition, severe 10/26/2020  . AKI (acute kidney injury) (HCC) 10/20/2020  . Hyponatremia 10/20/2020  . CAP (community acquired pneumonia) 10/20/2020  . Parapneumonic effusion 10/20/2020  . Parkinson's disease (HCC)   . GERD (gastroesophageal reflux disease)     Past Surgical History:  Procedure Laterality Date  . TONSILLECTOMY      Prior to Admission medications   Medication Sig Start Date End Date Taking? Authorizing Provider  acetaminophen (TYLENOL) 325 MG tablet Take 325 mg by mouth every 6 (six) hours as needed for mild pain. 08/21/19   [provider]  carbidopa-levodopa-entacapone (STALEVO) 37.5-150-200 MG tablet Take 1 tablet by mouth 4 (four) times daily. 10/09/20   [provider]  clonazePAM (KLONOPIN) 1 MG tablet Take 1 tablet (1 mg total) by mouth at bedtime. 10/28/20   Lurene Shadow, MD  glycopyrrolate (ROBINUL) 1 MG tablet Take 1 mg by mouth 3 (three) times daily. 09/24/20   [provider]  GOCOVRI 137 MG CP24 Take 1 capsule by mouth at bedtime. 09/30/20   [provider]  melatonin 5 MG TABS Take 1 tablet (5 mg total) by mouth at bedtime as needed. 10/28/20   Lurene Shadow, MD  omeprazole (PRILOSEC) 20 MG capsule Take 20 mg by mouth at bedtime. 08/21/20   [provider]  polyethylene glycol powder (GLYCOLAX/MIRALAX) 17 GM/SCOOP powder Take by mouth.    [provider]  rasagiline (  AZILECT) 1 MG TABS tablet Take 1 mg by mouth daily. 08/11/20   [provider]  tamsulosin (FLOMAX) 0.4 MG CAPS capsule Take 0.4 mg by mouth daily with lunch. 08/11/20   [provider]    Allergies Patient has no known allergies.  No family history on file.  Social History Social History   Tobacco Use  . Smoking status: Never Smoker  . Smokeless tobacco: Never Used  Substance Use Topics  . Alcohol use: Not Currently    Review of Systems Constitutional: No fever. Eyes: No visual  changes. ENT: No sore throat. Cardiovascular: Denies chest pain. Respiratory: Denies shortness of breath. Gastrointestinal: No nausea, vomiting, diarrhea. Genitourinary: Negative for dysuria. Musculoskeletal: Negative for back pain. Skin: Negative for rash. Neurological: Negative for focal weakness or numbness.  ____________________________________________   PHYSICAL EXAM:  VITAL SIGNS: ED Triage Vitals  Enc Vitals Group     BP 12/29/20 0537 (!) 150/75     Pulse Rate 12/29/20 0537 (!) 59     Resp 12/29/20 0537 15     Temp --      Temp src --      SpO2 12/29/20 0537 97 %     Weight 12/29/20 0533 135 lb (61.2 kg)     Height 12/29/20 0533 5\' 6"  (1.676 m)     Head Circumference --      Peak Flow --      Pain Score 12/29/20 0533 0     Pain Loc --      Pain Edu? --      Excl. in GC? --   Unable to get temperature after multiple attempts oral and axillary.  Patient does not feel hot to touch or cool. CONSTITUTIONAL: Alert and oriented to person and responds appropriately to questions. Well-appearing; well-nourished, elderly, thin, no distress HEAD: Normocephalic, atraumatic EYES: Conjunctivae clear, pupils appear equal, EOM appear intact ENT: normal nose; moist mucous membranes NECK: Supple, normal ROM, no midline spinal tenderness or step-off or deformity CARD: RRR; S1 and S2 appreciated; no murmurs, no clicks, no rubs, no gallops RESP: Normal chest excursion without splinting or tachypnea; breath sounds clear and equal bilaterally; no wheezes, no rhonchi, no rales, no hypoxia or respiratory distress, speaking full sentences ABD/GI: Normal bowel sounds; non-distended; soft, non-tender, no rebound, no guarding, no peritoneal signs, no hepatosplenomegaly GU: Patient has small amount of blood at the urethral meatus but no active bleeding.  There are some small clots in his underwear.  No other sign of penile injury.  No scrotal ecchymosis or swelling. BACK: The back appears normal,  no midline spinal tenderness or step-off or deformity; + kyphosis EXT: Normal ROM in all joints; no deformity or tenderness noted, no edema; no cyanosis, skin tear to the right dorsal hand and right upper posterior arm; pelvis stable and nontender to palpation, no leg length discrepancy, compartments are soft SKIN: Normal color for age and race; warm; no rash on exposed skin NEURO: Moves all extremities equally, normal speech, no facial asymmetry PSYCH: The patient's mood and manner are appropriate.  ____________________________________________   LABS (all labs ordered are listed, but only abnormal results are displayed)  Labs Reviewed  URINE CULTURE  URINALYSIS, ROUTINE W REFLEX MICROSCOPIC   ____________________________________________  EKG   ____________________________________________  RADIOLOGY I, Glynn Freas, personally viewed and evaluated these images (plain radiographs) as part of my medical decision making, as well as reviewing the written report by the radiologist.  ED MD interpretation:    Official radiology  report(s): No results found.  ____________________________________________   PROCEDURES  Procedure(s) performed (including Critical Care):  Procedures    ____________________________________________   INITIAL IMPRESSION / ASSESSMENT AND PLAN / ED COURSE  As part of my medical decision making, I reviewed the following data within the electronic MEDICAL RECORD NUMBER History obtained from family, Nursing notes reviewed and incorporated, Labs reviewed , Old chart reviewed and Notes from prior ED visits         Patient here with unwitnessed fall who accidentally pulled out his Foley catheter.  Wife reports that they were scheduled to remove his Foley catheter next Tuesday the 26.  Will encourage fluids here to see if we are able to get him to urinate on his own so that we may not have to replace his catheter given I suspect he has had urethral injury today.   He was bleeding but this appears to have stopped.  He does not appear to be in distress.  Wife would really like to leave out the Foley catheter if possible.  We discussed that he could develop recurrence of urinary retention and have to come back at a later time to have a Foley catheter placed and she verbalized understanding.  Patient did have 1 unwitnessed fall and 1 witnessed fall.  He has skin tears to his left hand and left posterior upper arm.  No other sign of traumatic injury on exam.  Denies hitting his head.  No recent infectious symptoms.  Seems to be at his neurologic baseline.  Will obtain CT imaging of his head and cervical spine.  We will clean wounds to his right arm and apply bacitracin and sterile dressing.  ED PROGRESS  CTs pending.  Urine pending.  Signed out to Dr. Derrill Kay to reassess patient and dispo.  I reviewed all nursing notes and pertinent previous records as available.  I have reviewed and interpreted any EKGs, lab and urine results, imaging (as available).  ____________________________________________   FINAL CLINICAL IMPRESSION(S) / ED DIAGNOSES  Final diagnoses:  Traumatic injury of urethra  Complication of Foley catheter, initial encounter (HCC)  Fall, initial encounter  Multiple skin tears     ED Discharge Orders    None      *Please note:  Joshua George was evaluated in Emergency Department on 12/29/2020 for the symptoms described in the history of present illness. He was evaluated in the context of the global COVID-19 pandemic, which necessitated consideration that the patient might be at risk for infection with the SARS-CoV-2 virus that causes COVID-19. Institutional protocols and algorithms that pertain to the evaluation of patients at risk for COVID-19 are in a state of rapid change based on information released by regulatory bodies including the CDC and federal and state organizations. These policies and algorithms were followed during the patient's  care in the ED.  Some ED evaluations and interventions may be delayed as a result of limited staffing during and the pandemic.*   Note:  This document was prepared using Dragon voice recognition software and may include unintentional dictation errors.   Rahmah Mccamy, Layla Maw, DO 12/29/20 [phone]

## 2020-12-29 NOTE — Consult Note (Signed)
Urology Consult  I have been asked to see the patient by Dr. Elesa Massed, for evaluation and management of difficult Foley placement following traumatic removal.  Chief Complaint: Fall, traumatic Foley pull, bleeding from penis  History of Present Illness: Joshua George is a 80 y.o. year old male with PMH dementia, Parkinson's disease, and BPH with urinary retention who presented to the ED this morning with reports of traumatic Foley removal caused by mechanical fall.  He is accompanied today at the bedside by his wife, [name], who contributes to HPI.  [name] reports progressive worsening of his cognitive status over the past several weeks.  He has been forgetting to request assistance to get out of bed.  At some point overnight, he got out of bed by himself and fell, snagging his Foley catheter on the way and causing traumatic Foley removal.  He had bleeding from the urethral meatus that has slowed but has been unable to void since.  Patient is somnolent today and unable to contribute to HPI.  UA and culture ordered but not yet collected.  Past Medical History:  Diagnosis Date  . BPH (benign prostatic hyperplasia)   . Dementia (HCC)   . GERD (gastroesophageal reflux disease)   . Parkinson's disease [facility])     Past Surgical History:  Procedure Laterality Date  . TONSILLECTOMY      Home Medications:  No outpatient medications have been marked as taking for the 12/29/20 encounter [facility] Encounter).    Allergies: No Known Allergies  No family history on file.  Social History:  reports that he has never smoked. He has never used smokeless tobacco. He reports previous alcohol use. No history on file for drug use.  ROS: A complete review of systems was performed.  All systems are negative except for pertinent findings as noted.  Physical Exam:  Vital signs in last 24 hours: Temp:  [93.7 F (34.3 C)] 93.7 F (34.3 C) (04/19 0729) Pulse Rate:  [51-65] 55 (04/19 1100) Resp:  [15-17]  17 (04/19 0700) BP: (110-178)/(66-84) 166/81 (04/19 1100) SpO2:  [97 %-100 %] 99 % (04/19 1100) Weight:  [61.2 kg] 61.2 kg (04/19 0533) Constitutional: Somnolent but arousable, thin, no acute distress HEENT: [place] AT, moist mucus membranes Cardiovascular: No clubbing, cyanosis, or edema Respiratory: Normal respiratory effort GU: Dried blood across the anterior thighs with scant bleeding from the urethral meatus Skin: No rashes, bruises or suspicious lesions Psychiatric: Responds verbally, speech unintelligible   Laboratory Data:  Urinalysis    Component Value Date/Time   COLORURINE AMBER (A) 11/14/2020 2031   APPEARANCEUR Cloudy (A) 12/08/2020 1154   LABSPEC 1.014 11/14/2020 2031   PHURINE 5.0 11/14/2020 2031   GLUCOSEU Trace (A) 12/08/2020 1154   HGBUR MODERATE (A) 11/14/2020 2031   BILIRUBINUR Negative 12/08/2020 1154   KETONESUR 5 (A) 11/14/2020 2031   PROTEINUR 2+ (A) 12/08/2020 1154   PROTEINUR NEGATIVE 11/14/2020 2031   NITRITE Negative 12/08/2020 1154   NITRITE NEGATIVE 11/14/2020 2031   LEUKOCYTESUR Trace (A) 12/08/2020 1154   LEUKOCYTESUR NEGATIVE 11/14/2020 2031   Results for orders placed or performed in visit on 12/08/20  Microscopic Examination     Status: Abnormal   Collection Time: 12/08/20 11:54 AM   Urine  Result Value Ref Range Status   WBC, UA >30 (A) 0 - 5 /hpf Final   RBC 3-10 (A) 0 - 2 /hpf Final   Epithelial Cells (non renal) 0-10 0 - 10 /hpf Final   Casts Present (A) None seen /  lpf Final   Cast Type Granular casts (A) N/A Final   Crystals Present (A) N/A Final   Crystal Type Calcium Oxalate N/A Final    Comment: Amorphous Sediment   Mucus, UA Present (A) Not Estab. Final   Bacteria, UA Moderate (A) None seen/Few Final  CULTURE, URINE COMPREHENSIVE     Status: Abnormal   Collection Time: 12/08/20  1:00 PM   Specimen: Urine   UR  Result Value Ref Range Status   Urine Culture, Comprehensive Final report (A)  Final   Organism ID, Bacteria Comment  (A)  Final    Comment: Staphylococcus epidermidis Based on resistance to oxacillin this isolate would be resistant to all currently available beta-lactam antimicrobial agents, with the exception of the newer cephalosporins with anti-MRSA activity, such as Ceftaroline Greater than 100,000 colony forming units per mL    ANTIMICROBIAL SUSCEPTIBILITY Comment  Final    Comment:       ** S = Susceptible; I = Intermediate; R = Resistant **                    P = Positive; N = Negative             MICS are expressed in micrograms per mL    Antibiotic                 RSLT#1    RSLT#2    RSLT#3    RSLT#4 Ciprofloxacin                  R Gentamicin                     S Levofloxacin                   R Linezolid                      S Moxifloxacin                   R Nitrofurantoin                 S Oxacillin                      R Penicillin                     R Quinupristin/Dalfopristin      S Rifampin                       S Tetracycline                   R Trimethoprim/Sulfa             S Vancomycin                     S    Assessment & Plan:  80 year old male with Parkinson's disease, dementia, and urinary retention currently managed with indwelling Foley catheter presents after fall causing traumatic Foley removal.  I assessed the patient at the bedside with my colleague, Michiel Cowboy, PA-C.  Using sterile technique, I cleaned and prepped the patient with Betadine and instilled 2 tubes of 2% lidocaine jelly into the urethra.  Carollee Herter inserted an 56 [place] coud Foley catheter into the patient's bladder with immediate efflux of 900 cc cherry red to pink urine with small clots.  The  balloon was inflated with 10 cc of sterile water.  Subsequently, I irrigated the patient's Foley catheter with 120 mL of sterile water with efflux of red urine without clots.  Foley catheter irrigated easily with low concern for retained clots in the bladder.  Recommendations: -Obtain urine sample for UA  and culture -Recommend at least 3 days of empiric antibiotics for UTI prevention in the setting of traumatic Foley removal and difficult Foley placement -Keep Foley catheter in place and irrigate as needed with sterile water -Outpatient follow-up with Michiel Cowboy in 1 week for voiding trial  Thank you for involving me in this patient's care, please page with any further questions or concerns.  Carman Ching, PA-C 12/29/2020 12:57 PM

## 2020-12-29 NOTE — ED Provider Notes (Signed)
CT head/cervical spine without acute traumatic findings. Patient did have foley catheter replaced by urology team. Will plan on discharging home.    Phineas Semen, MD 12/29/20 1332

## 2020-12-29 NOTE — ED Notes (Signed)
Urology placed new foley catheter

## 2020-12-29 NOTE — ED Notes (Signed)
Pt wife consents to receiving discharge instructions

## 2021-01-04 NOTE — Progress Notes (Signed)
01/05/2021 3:39 PM   Joshua George April 13, 1941 [id number]  Referring provider: Kandyce Rud, MD [address] [facility] [place],  [place] [address]  Chief Complaint  Patient presents with  . Urinary Retention   Urological history: 1. Urinary retention -incidental finding of retention after presenting to the ED for delirium -1800 mL in the bladder   2. BPH with retention -PSA 2.58 in 08/2019 -managed with tamsulosin 0.4 mg daily  HPI: Joshua George is a 80 y.o. male who presents today for TOV.   Catheter Removal Patient is present today for a catheter removal.  9 ml of water was drained from the balloon. A 18 FR coude foley cath was removed from the bladder no complications were noted .  Patient tolerated well.  Performed by: Myself  Patient's neurologist would like a urinalysis and urine sent for culture due to patient's sudden onset of hallucinations.  PVR on return this afternoon 72 mL.  Unfortunately, there was not enough urine in the specimen cup to send for UA and culture.   PMH: Past Medical History:  Diagnosis Date  . BPH (benign prostatic hyperplasia)   . Dementia (HCC)   . GERD (gastroesophageal reflux disease)   . Parkinson's disease [facility])     Surgical History: Past Surgical History:  Procedure Laterality Date  . TONSILLECTOMY      Home Medications:  Allergies as of 01/05/2021   No Known Allergies     Medication List       Accurate as of January 05, 2021  3:39 PM. If you have any questions, ask your nurse or doctor.        acetaminophen 325 MG tablet Commonly known as: TYLENOL Take 325 mg by mouth every 6 (six) hours as needed for mild pain.   carbidopa-levodopa-entacapone 37.5-150-200 MG tablet Commonly known as: STALEVO Take 1 tablet by mouth 4 (four) times daily.   clonazePAM 1 MG tablet Commonly known as: KLONOPIN Take 1 tablet (1 mg total) by mouth at bedtime.   glycopyrrolate 1 MG  tablet Commonly known as: ROBINUL Take 1 mg by mouth 3 (three) times daily.   Gocovri 137 MG Cp24 Generic drug: Amantadine HCl ER Take 1 capsule by mouth at bedtime.   melatonin 5 MG Tabs Take 1 tablet (5 mg total) by mouth at bedtime as needed.   omeprazole 20 MG capsule Commonly known as: PRILOSEC Take 20 mg by mouth at bedtime.   polyethylene glycol powder 17 GM/SCOOP powder Commonly known as: GLYCOLAX/MIRALAX Take by mouth.   rasagiline 1 MG Tabs tablet Commonly known as: AZILECT Take 1 mg by mouth daily.   tamsulosin 0.4 MG Caps capsule Commonly known as: FLOMAX Take 0.4 mg by mouth daily with lunch.       Allergies: No Known Allergies  Family History: History reviewed. No pertinent family history.  Social History:  reports that he has never smoked. He has never used smokeless tobacco. He reports previous alcohol use. No history on file for drug use.  ROS: Pertinent ROS in HPI  Physical Exam: There were no vitals taken for this visit.  Constitutional:  Well nourished. Alert and oriented, No acute distress. HEENT: [place] AT, mask in place.  Trachea midline Cardiovascular: No clubbing, cyanosis, or edema. Respiratory: Normal respiratory effort, no increased work of breathing. Neurologic: Grossly intact, no focal deficits, moving all 4 extremities. Psychiatric: Normal mood and affect.  Laboratory Data: Lab Results  Component Value Date  WBC 10.2 11/14/2020   HGB 12.8 (L) 11/14/2020   HCT 39.4 11/14/2020   MCV 92.5 11/14/2020   PLT 184 11/14/2020    Lab Results  Component Value Date   CREATININE 0.87 11/14/2020    Lab Results  Component Value Date   AST 14 (L) 10/20/2020   Lab Results  Component Value Date   ALT <5 10/20/2020  I have reviewed the labs.   Pertinent Imaging: Results for ESTEVEN, OVERFELT (MRN [id number]) as of 01/05/2021 15:44  Ref. Range 01/05/2021 15:44  Scan Result Unknown 74   Assessment & Plan:    1. Acute urinary retention:     -foley catheter removed -voiding trial today   -PVR is minimal, but I am concerned this is more likely due to lack of fluid intake versus good bladder emptying -They will return tomorrow as we are in need of a cath UA for UA and culture  No follow-ups on file.  These notes generated with voice recognition software. I apologize for typographical errors.  Ressie Slevin, PA-C   I spent 20 minutes on the day of the encounter to include pre-visit record review, face-to-face time with the patient, and post-visit ordering of tests.  [facility] [address]  [address] [place], [place] [address] [phone]

## 2021-01-05 ENCOUNTER — Encounter: Payer: Self-pay | Admitting: Urology

## 2021-01-05 ENCOUNTER — Ambulatory Visit: Payer: Medicare PPO | Admitting: Urology

## 2021-01-05 ENCOUNTER — Other Ambulatory Visit: Payer: Self-pay

## 2021-01-05 DIAGNOSIS — R339 Retention of urine, unspecified: Secondary | ICD-10-CM | POA: Diagnosis not present

## 2021-01-05 LAB — BLADDER SCAN AMB NON-IMAGING: Scan Result: 74

## 2021-01-06 ENCOUNTER — Other Ambulatory Visit: Payer: Self-pay | Admitting: Urology

## 2021-01-06 ENCOUNTER — Ambulatory Visit (INDEPENDENT_AMBULATORY_CARE_PROVIDER_SITE_OTHER): Payer: Medicare PPO | Admitting: Urology

## 2021-01-06 ENCOUNTER — Encounter: Payer: Self-pay | Admitting: Urology

## 2021-01-06 DIAGNOSIS — R339 Retention of urine, unspecified: Secondary | ICD-10-CM

## 2021-01-06 LAB — BLADDER SCAN AMB NON-IMAGING

## 2021-01-06 NOTE — Progress Notes (Signed)
01/06/2021 8:41 AM   Joshua George [birth year] [id number]  Referring provider: Kandyce Rud, MD [address] [facility] [place],  [place] [address]  Chief Complaint  Patient presents with  . Urinary Retention   Urological history: 1. Urinary retention -incidental finding of retention after presenting to the ED for delirium -1800 mL in the bladder   2. BPH with retention -PSA 2.58 in 08/2019 -managed with tamsulosin 0.4 mg daily  HPI: Joshua George is a 80 y.o. male who presents today for PVR with his wife, [name].    His PVR is 999 mL.  His wife states that he has been unable to urinate all night and this morning.  Patient denies any modifying or aggravating factors.  Patient denies any gross hematuria, dysuria or flank pain.  Patient denies any fevers, chills, nausea or vomiting.   CATH UA >30 WBC's and many bacteria.     PMH: Past Medical History:  Diagnosis Date  . BPH (benign prostatic hyperplasia)   . Dementia (HCC)   . GERD (gastroesophageal reflux disease)   . Parkinson's disease [facility])     Surgical History: Past Surgical History:  Procedure Laterality Date  . TONSILLECTOMY      Home Medications:  Allergies as of 01/06/2021   No Known Allergies     Medication List       Accurate as of January 06, 2021 11:59 PM. If you have any questions, ask your nurse or doctor.        acetaminophen 325 MG tablet Commonly known as: TYLENOL Take 325 mg by mouth every 6 (six) hours as needed for mild pain.   carbidopa-levodopa-entacapone 37.5-150-200 MG tablet Commonly known as: STALEVO Take 1 tablet by mouth 4 (four) times daily.   clonazePAM 1 MG tablet Commonly known as: KLONOPIN Take 1 tablet (1 mg total) by mouth at bedtime.   glycopyrrolate 1 MG tablet Commonly known as: ROBINUL Take 1 mg by mouth 3 (three) times daily.   Gocovri 137 MG Cp24 Generic drug: Amantadine HCl ER Take 1 capsule by mouth at  bedtime.   melatonin 5 MG Tabs Take 1 tablet (5 mg total) by mouth at bedtime as needed.   omeprazole 20 MG capsule Commonly known as: PRILOSEC Take 20 mg by mouth at bedtime.   polyethylene glycol powder 17 GM/SCOOP powder Commonly known as: GLYCOLAX/MIRALAX Take by mouth.   rasagiline 1 MG Tabs tablet Commonly known as: AZILECT Take 1 mg by mouth daily.   tamsulosin 0.4 MG Caps capsule Commonly known as: FLOMAX Take 0.4 mg by mouth daily with lunch.       Allergies: No Known Allergies  Family History: No family history on file.  Social History:  reports that he has never smoked. He has never used smokeless tobacco. He reports previous alcohol use. No history on file for drug use.  ROS: Pertinent ROS in HPI  Physical Exam: Constitutional:  Well nourished. Alert and oriented, No acute distress. HEENT: [place] AT, mask in place.  Trachea midline Cardiovascular: No clubbing, cyanosis, or edema. Respiratory: Normal respiratory effort, no increased work of breathing. GU: No CVA tenderness.  No bladder fullness or masses.  Patient with uncircumcised phallus.   Foreskin easily retracted  Urethral meatus is patent.  No penile discharge. No penile lesions or rashes. Scrotum without lesions, cysts, rashes and/or edema.   Neurologic: Grossly intact, no focal deficits, moving all 4 extremities. Psychiatric: Normal mood and affect.  Laboratory  Data: Urinalysis Component     Latest Ref Rng & Units 01/06/2021  Specific Gravity, UA     1.005 - 1.030 1.020  pH, UA     5.0 - 7.5 6.0  Color, UA     Yellow Orange  Appearance Ur     Clear Cloudy (A)  Leukocytes,UA     Negative Trace (A)  Protein,UA     Negative/Trace Negative  Glucose, UA     Negative Negative  Ketones, UA     Negative Negative  RBC, UA     Negative Negative  Bilirubin, UA     Negative Negative  Urobilinogen, Ur     0.2 - 1.0 mg/dL 0.2  Nitrite, UA     Negative Negative  Microscopic Examination      See  below:   Component     Latest Ref Rng & Units 01/06/2021  WBC, UA     0 - 5 /hpf >30 (A)  RBC     0 - 2 /hpf 0-2  Epithelial Cells (non renal)     0 - 10 /hpf 0-10  Bacteria, UA     None seen/Few Many (A)  I have reviewed the labs.   Pertinent Imaging: Results for KHALIQ, TURAY (MRN [id number]) as of 01/06/2021 11:43  Ref. Range 01/06/2021 11:39  Scan Result Unknown     Simple Catheter Placement Due to urinary retention patient is present today for a foley cath placement.  Patient was cleaned and prepped in a sterile fashion with betadine. An 40 FR coude foley catheter was inserted, urine return was noted  1100 ml, urine was orange in color.  Foreskin was placed over the glans.  The balloon was filled with 10cc of sterile water.  A night bag was attached for drainage.  Patient's wife was given instruction on proper catheter care.  Patient tolerated well, no complications were noted   Performed by: Myself   Assessment & Plan:    1. Acute urinary retention:    -Catheter replaced -Patient's wife is wanting further investigation into why her husband is not urinating.  I explained that with his Parkinson's disease it may be difficult to isolate a specific cause for his urinary retention.  I stated we can schedule for cystoscopy to see if his prostate is causing urinary obstruction, but I cautioned her that even if this is the case and he underwent a bladder outlet procedure it may not relieve his urinary retention as we do not know what part the Parkinson's disease plays in his bladder function -UA -Urine sent for culture -I have explained to the patient that they will  be scheduled for a cystoscopy in our office to evaluate their bladder.  The cystoscopy consists of passing a tube with a lens up through their urethra and into their urinary bladder.   We will inject the urethra with a lidocaine gel prior to introducing the cystoscope to help with any discomfort during the procedure.    After the procedure, they might experience blood in the urine and discomfort with urination.  This will abate after the first few voids.  I have  encouraged the patient to increase water intake  during this time.  Patient denies any allergies to lidocaine.    Return for Cysto with Dr. Lonna Cobb to evaluate for BOO .  These notes generated with voice recognition software. I apologize for typographical errors.  Laylee Schooley, PA-C   I spent 20 minutes on the day of  the encounter to include pre-visit record review, face-to-face time with the patient, and post-visit ordering of tests.  [facility] [address]  [address] [place], [place] [address] [phone]

## 2021-01-08 LAB — URINALYSIS, COMPLETE
Bilirubin, UA: NEGATIVE
Glucose, UA: NEGATIVE
Ketones, UA: NEGATIVE
Nitrite, UA: NEGATIVE
Protein,UA: NEGATIVE
RBC, UA: NEGATIVE
Specific Gravity, UA: 1.02 (ref 1.005–1.030)
Urobilinogen, Ur: 0.2 mg/dL (ref 0.2–1.0)
pH, UA: 6 (ref 5.0–7.5)

## 2021-01-08 LAB — MICROSCOPIC EXAMINATION: WBC, UA: >30 /hpf — AB (ref 0–5)

## 2021-01-10 LAB — CULTURE, URINE COMPREHENSIVE

## 2021-01-11 ENCOUNTER — Other Ambulatory Visit: Payer: Self-pay | Admitting: *Deleted

## 2021-01-11 MED ORDER — SULFAMETHOXAZOLE-TRIMETHOPRIM 800-160 MG PO TABS: 1.0000 | ORAL_TABLET | Freq: Two times a day (BID) | ORAL | 0 refills | Status: AC

## 2021-01-18 ENCOUNTER — Telehealth: Payer: Self-pay | Admitting: *Deleted

## 2021-01-18 NOTE — Telephone Encounter (Signed)
Home health nurse called and states patient has been on antibiotic and she would like to check his urine on Thursday. He would be off antibiotic for three day . Nurse is changing his cathter , She will get a clear catch. Advised nurse to get a ua.

## 2021-01-22 ENCOUNTER — Other Ambulatory Visit: Payer: Self-pay | Admitting: Urology

## 2021-01-28 ENCOUNTER — Other Ambulatory Visit: Payer: Self-pay

## 2021-02-04 ENCOUNTER — Encounter: Payer: Self-pay | Admitting: Urology

## 2021-02-04 ENCOUNTER — Ambulatory Visit: Payer: Medicare PPO | Admitting: Urology

## 2021-02-04 ENCOUNTER — Other Ambulatory Visit: Payer: Self-pay

## 2021-02-04 VITALS — BP 100/70 | HR 68 | Ht 67.0 in | Wt 129.0 lb

## 2021-02-04 DIAGNOSIS — R339 Retention of urine, unspecified: Secondary | ICD-10-CM

## 2021-02-04 NOTE — Progress Notes (Signed)
   02/04/21  CC:  Chief Complaint  Patient presents with  . Cysto    HPI: 80 y.o. male with Parkinson's and high-volume urinary retention (1800 cc after initial catheter placement)  There were no vitals taken for this visit. NED. A&Ox3.   No respiratory distress   Abd soft, NT, ND Normal phallus with bilateral descended testicles  Cystoscopy Procedure Note  Patient identification was confirmed, informed consent was obtained, and patient was prepped using Betadine solution.  Lidocaine jelly was administered per urethral meatus.     Pre-Procedure: - Inspection reveals a normal caliber urethral meatus.  Procedure: The flexible cystoscope was introduced without difficulty - No urethral strictures/lesions are present. - Prominent lateral lobe enlargement prostate  - Moderate median lobe  - Bilateral ureteral orifices not identified -Inflammatory change bladder base secondary indwelling catheter but no gross tumor - No bladder stones -Moderate trabeculation  Retroflexion shows mild intravesical protrusion of lateral lobes   Post-Procedure: - Patient tolerated the procedure well  Assessment/ Plan:  Significant BPH  Discussed with his wife over the prostate significant large he may have decreased detrusor tone secondary to chronic outlet obstruction and Parkinson's  We also discussed the high incidence of urinary continence and patient's with Parkinson's and outlet procedures  We also discussed chronic catheter drainage via suprapubic tube and teaching her how to perform CIC  She states his neurologist recently took him off the medication that has been associated with urinary retention and would like a repeat voiding trial in 2-4 weeks   Riki Altes, MD

## 2021-02-04 NOTE — Progress Notes (Signed)
Cath Change/ Replacement  Patient is present today for a catheter change due to urinary retention.  75ml of water was removed from the balloon, a 16CoudeFR foley cath was removed with out difficulty.  Patient was cleaned and prepped in a sterile fashion with betadine. A 16 coude FR foley cath was replaced into the bladder no complications were noted Urine return was noted 8ml and urine was yellow -orange in color. The balloon was filled with 47ml of sterile water. A night bag was attached for drainage.  A night bag was also given to the patient and patient was given instruction on how to change from one bag to another. Patient was given proper instruction on catheter care.    Performed by: Ples Specter CMA

## 2021-02-05 ENCOUNTER — Encounter: Payer: Self-pay | Admitting: Urology

## 2021-03-08 NOTE — Progress Notes (Signed)
03/09/2021 4:29 PM   Joshua George 06/11/1941 [id number]  Referring provider: Kandyce Rud, MD [address] [facility] - [facility] [place],  [place] [address]  Urological history: 1. Urinary retention -incidental finding of retention after presenting to the ED for delirium -1800 mL in the bladder  -cysto 2022  Prominent lateral lobe enlargement prostate - Moderate median lobe - Moderate trabeculation  2. BPH with retention -PSA 2.58 in 08/2019 -managed with tamsulosin 0.4 mg daily  Chief Complaint  Patient presents with   Urinary Retention    HPI: Joshua George is a 80 y.o. male who presents today for TOV with his wife, [name].    He had urinary retention after an episode of delirium and failed a TOV.  His neurologist has adjusted his medications and they are hear for a second trial of void.  Catheter Removal  Patient is present today for a catheter removal.  9 ml of water was drained from the balloon. A 16 FR Coude foley cath was removed from the bladder no complications were noted . Patient tolerated well.  Performed by: Michiel Cowboy, PA-C   He returns this afternoon for PVR.  He states that he has been pushing fluids and he urinated on his own twice.  PVR 74 mL  PMH: Past Medical History:  Diagnosis Date   BPH (benign prostatic hyperplasia)    Dementia (HCC)    GERD (gastroesophageal reflux disease)    Parkinson's disease (HCC)     Surgical History: Past Surgical History:  Procedure Laterality Date   TONSILLECTOMY      Home Medications:  Allergies as of 03/09/2021   No Known Allergies      Medication List        Accurate as of March 09, 2021  4:29 PM. If you have any questions, ask your nurse or doctor.          acetaminophen 325 MG tablet Commonly known as: TYLENOL Take 325 mg by mouth every 6 (six) hours as needed for mild pain.   carbidopa-levodopa-entacapone 37.5-150-200 MG tablet Commonly known as:  STALEVO Take 1 tablet by mouth 4 (four) times daily.   clonazePAM 1 MG tablet Commonly known as: KLONOPIN Take 1 tablet (1 mg total) by mouth at bedtime.   glycopyrrolate 1 MG tablet Commonly known as: ROBINUL Take 1 mg by mouth 3 (three) times daily.   Gocovri 137 MG Cp24 Generic drug: Amantadine HCl ER Take 1 capsule by mouth at bedtime.   melatonin 5 MG Tabs Take 1 tablet (5 mg total) by mouth at bedtime as needed.   omeprazole 20 MG capsule Commonly known as: PRILOSEC Take 20 mg by mouth at bedtime.   polyethylene glycol powder 17 GM/SCOOP powder Commonly known as: GLYCOLAX/MIRALAX Take by mouth.   rasagiline 1 MG Tabs tablet Commonly known as: AZILECT Take 1 mg by mouth daily.   tamsulosin 0.4 MG Caps capsule Commonly known as: FLOMAX Take 0.4 mg by mouth daily with lunch.        Allergies: No Known Allergies  Family History: No family history on file.  Social History:  reports that he has never smoked. He has never used smokeless tobacco. He reports previous alcohol use. No history on file for drug use.  ROS: Pertinent ROS in HPI  Physical Exam: Constitutional:  Well nourished. Alert and oriented, No acute distress. HEENT: [place] AT, mask in place  Trachea midline Cardiovascular: No clubbing, cyanosis, or edema. Respiratory: Normal  respiratory effort, no increased work of breathing. GI: Abdomen is soft, non tender, non distended, stool in palpated in the LLQ GU: No CVA tenderness.  No bladder fullness or masses.   Neurologic: Grossly intact, no focal deficits, moving all 4 extremities.  Nonambulatory. Psychiatric: Normal mood and affect.   Laboratory Data: No new laboratory data since last visit     Pertinent Imaging: Results for BRET, VANESSEN (MRN [id number]) as of 03/09/2021 16:27  Ref. Range 03/09/2021 14:51  Scan Result Unknown 74    Assessment & Plan:    1. Acute urinary retention:    -Resolved for now -Continue tamsulosin 0.4 mg  daily  Return in about 1 month (around 04/08/2021) for IPSS and PVR.  These notes generated with voice recognition software. I apologize for typographical errors.  Jemari Hallum, PA-C   I spent 20 minutes on the day of the encounter to include pre-visit record review, face-to-face time with the patient, and post-visit ordering of tests.  [facility] [address]  [address] [place], [place] [address] [phone]

## 2021-03-09 ENCOUNTER — Other Ambulatory Visit: Payer: Self-pay

## 2021-03-09 ENCOUNTER — Ambulatory Visit: Payer: Medicare PPO | Admitting: Urology

## 2021-03-09 ENCOUNTER — Encounter: Payer: Self-pay | Admitting: Urology

## 2021-03-09 DIAGNOSIS — R339 Retention of urine, unspecified: Secondary | ICD-10-CM | POA: Diagnosis not present

## 2021-03-09 LAB — BLADDER SCAN AMB NON-IMAGING: Scan Result: 74

## 2021-03-10 ENCOUNTER — Encounter: Payer: Self-pay | Admitting: Urology

## 2021-03-10 ENCOUNTER — Ambulatory Visit (INDEPENDENT_AMBULATORY_CARE_PROVIDER_SITE_OTHER): Payer: Medicare PPO | Admitting: Urology

## 2021-03-10 VITALS — BP 101/65 | HR 67 | Temp 97.1°F | Ht 68.0 in | Wt 136.0 lb

## 2021-03-10 DIAGNOSIS — R300 Dysuria: Secondary | ICD-10-CM | POA: Diagnosis not present

## 2021-03-10 DIAGNOSIS — R339 Retention of urine, unspecified: Secondary | ICD-10-CM

## 2021-03-10 LAB — BLADDER SCAN AMB NON-IMAGING: Scan Result: >173

## 2021-03-10 MED ORDER — SULFAMETHOXAZOLE-TRIMETHOPRIM 800-160 MG PO TABS: 1.0000 | ORAL_TABLET | Freq: Two times a day (BID) | ORAL | 0 refills | Status: DC
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
## 2021-04-13 NOTE — Progress Notes (Signed)
04/15/21 11:12 AM   Joshua George 1941/06/26 [id number]  Referring provider:  Derinda Late, MD [address] [facility] [place],  [place] [address] Chief Complaint  Patient presents with   Follow-up   Urological history: 1. Urinary retention -incidental finding of retention after presenting to the ED for delirium -1800 mL in the bladder -cysto 2022  Prominent lateral lobe enlargement prostate - Moderate median lobe - Moderate trabeculation   2. BPH with retention -PSA 2.58 in 08/2019 -managed with tamsulosin 0.4 mg daily    HPI: Joshua George is a 80 y.o.male who presents today for 1 month follow-up with IPSS and PVR.   PVR 03/10/2021 was 173 mL. He voided another 100 mL in the room while waiting to be seen.   IPSS score today is 17. PVR today was 131 mL.   Patient was accompanied today by his wife.  Patients wife states he experiences burning during urination at least once a month to which patient agrees. Patient denies any gross hematuria and suprapubic/flank pain.Patient denies any fevers, chills, nausea or vomiting.      IPSS     Row Name 04/15/21 1100         International Prostate Symptom Score   How often have you had the sensation of not emptying your bladder? About half the time     How often have you had to urinate less than every two hours? Almost always     How often have you found you stopped and started again several times when you urinated? About half the time     How often have you found it difficult to postpone urination? Less than half the time     How often have you had a weak urinary stream? Not at All     How often have you had to strain to start urination? Not at All     How many times did you typically get up at night to urinate? 4 Times     Total IPSS Score 17           Quality of Life due to urinary symptoms     If you were to spend the rest of your life with your urinary condition just the way  it is now how would you feel about that? Mixed             Score:  1-7 Mild 8-19 Moderate 20-35 Severe     PMH: Past Medical History:  Diagnosis Date   BPH (benign prostatic hyperplasia)    Dementia (HCC)    GERD (gastroesophageal reflux disease)    Parkinson's disease ([facility])     Surgical History: Past Surgical History:  Procedure Laterality Date   TONSILLECTOMY      Home Medications:  Allergies as of 04/15/2021   No Known Allergies      Medication List        Accurate as of April 15, 2021 11:12 AM. If you have any questions, ask your nurse or doctor.          STOP taking these medications    sulfamethoxazole-trimethoprim 800-160 MG tablet Commonly known as: BACTRIM DS Stopped by: Zara Council, PA-C       TAKE these medications    acetaminophen 325 MG tablet Commonly known as: TYLENOL Take 325 mg by mouth every 6 (six) hours as needed for mild pain.   carbidopa-levodopa-entacapone 37.5-150-200 MG tablet Commonly known as: STALEVO Take 1 tablet  by mouth 4 (four) times daily.   clonazePAM 1 MG tablet Commonly known as: KLONOPIN Take 1 tablet (1 mg total) by mouth at bedtime.   clonazePAM 1 MG tablet Commonly known as: KLONOPIN Take by mouth.   Cranberry 400 MG Caps Take by mouth.   glycopyrrolate 1 MG tablet Commonly known as: ROBINUL Take 1 mg by mouth 3 (three) times daily.   Gocovri 137 MG Cp24 Generic drug: Amantadine HCl ER Take 1 capsule by mouth at bedtime.   melatonin 5 MG Tabs Take 1 tablet (5 mg total) by mouth at bedtime as needed.   omeprazole 20 MG capsule Commonly known as: PRILOSEC Take 1 capsule by mouth at bedtime. What changed: Another medication with the same name was removed. Continue taking this medication, and follow the directions you see here. Changed by: Zara Council, PA-C   polyethylene glycol powder 17 GM/SCOOP powder Commonly known as: GLYCOLAX/MIRALAX Take by mouth.   QUEtiapine 25 MG  tablet Commonly known as: SEROQUEL TAKE 1 TABLET(25 MG) BY MOUTH EVERY NIGHT   rasagiline 1 MG Tabs tablet Commonly known as: AZILECT Take 1 mg by mouth daily.   tamsulosin 0.4 MG Caps capsule Commonly known as: FLOMAX Take 0.4 mg by mouth daily with lunch.        Allergies: No Known Allergies  Family History: No family history on file.  Social History:  reports that he has never smoked. He has never used smokeless tobacco. He reports previous alcohol use. No history on file for drug use.   Physical Exam: BP (!) 95/59   Pulse 78   Ht 5' 8"  (1.727 m)   Wt 136 lb (61.7 kg)   BMI 20.68 kg/m   Constitutional:  Alert and oriented, No acute distress. HEENT: [place] AT, mask in place.  Trachea midline Cardiovascular: No clubbing, cyanosis, or edema. Respiratory: Normal respiratory effort, no increased work of breathing. GU: No CVA tenderness Neurologic: Grossly intact, no focal deficits, moving all 4 extremities. Psychiatric: Normal mood and affect.  Laboratory Data: WBC (White Blood Cell Count) 4.1 - 10.2 10^3/uL 6.2   RBC (Red Blood Cell Count) 4.69 - 6.13 10^6/uL 4.46 Low    Hemoglobin 14.1 - 18.1 gm/dL 13.1 Low    Hematocrit 40.0 - 52.0 % 41.0   MCV (Mean Corpuscular Volume) 80.0 - 100.0 fl 91.9   MCH (Mean Corpuscular Hemoglobin) 27.0 - 31.2 pg 29.4   MCHC (Mean Corpuscular Hemoglobin Concentration) 32.0 - 36.0 gm/dL 32.0   Platelet Count 150 - 450 10^3/uL 152   RDW-CV (Red Cell Distribution Width) 11.6 - 14.8 % 12.5   MPV (Mean Platelet Volume) 9.4 - 12.4 fl 11.0   Neutrophils 1.50 - 7.80 10^3/uL 3.60   Lymphocytes 1.00 - 3.60 10^3/uL 1.70   Mixed Count 0.10 - 0.90 10^3/uL 0.90   Neutrophil % 32.0 - 70.0 % 58.4   Lymphocyte % 10.0 - 50.0 % 27.1   Mixed % 3.0 - 14.4 % 14.5 High    Resulting Agency  [facility]  Specimen Collected: 03/30/21 08:20 Last Resulted: 03/30/21 15:01  Received From: [facility]  Result Received: 04/14/21  12:54   Glucose 70 - 110 mg/dL 79   Sodium 136 - 145 mmol/L 141   Potassium 3.6 - 5.1 mmol/L 4.5   Chloride 97 - 109 mmol/L 105   Carbon Dioxide (CO2) 22.0 - 32.0 mmol/L 31.8   Urea Nitrogen (BUN) 7 - 25 mg/dL 21   Creatinine 0.7 -  1.3 mg/dL 1.1   Glomerular Filtration Rate (eGFR), MDRD Estimate >60 mL/min/1.73sq m 65   Calcium 8.7 - 10.3 mg/dL 9.3   AST  8 - 39 U/L 12   ALT  6 - 57 U/L 3 Low    Alk Phos (alkaline Phosphatase) 34 - 104 U/L 78   Albumin 3.5 - 4.8 g/dL 4.0   Bilirubin, Total 0.3 - 1.2 mg/dL 0.6   Protein, Total 6.1 - 7.9 g/dL 6.9   A/G Ratio 1.0 - 5.0 gm/dL 1.4   Resulting Agency  [facility]  Specimen Collected: 03/30/21 08:20 Last Resulted: 03/30/21 15:06  Received From: [facility]  Result Received: 04/14/21 12:54   PSA (Prostate Specific Antigen), Total 0.10 - 4.00 ng/mL 3.39   Resulting Agency  [facility] - [facility]   Narrative Performed by [facility] Test results were determined with Beckman Coulter Hybritech Assay. Values obtained with different assay methods cannot be used interchangeably in serial testing. Assay results should not be interpreted as absolute evidence of thepresence or absence of malignant disease Specimen Collected: 03/30/21 08:20 Last Resulted: 03/30/21 12:55  Received From: [facility]  Result Received: 04/14/21 12:54  I have reviewed the labs.      Pertinent Imaging: Results for orders placed or performed in visit on 04/15/21  Bladder Scan (Post Void Residual) in office  Result Value Ref Range   Scan Result 131       Assessment & Plan:    BPH WITH LUTS  - PVR stable  - Continue tamsulosin 0.4 mg daily  - Follow-up with IPSS/PVR in 3 months.   Dysuria - Still with rare intermittent dysuria  - Follow-up with UA  - patient does not report any hematuria in urine     Follow-up in 3 months with UA, IPSS/PVR; they prefer late morning/ early afternoon  appointments.   I,Kailey Littlejohn,acting as a Education administrator for [employer], PA-C.,have documented all relevant documentation on the behalf of [place], PA-C,as directed by  [facility], PA-C while in the presence of Talaya Lamprecht, PA-C.   [facility] [address], [place], [place] [address] [phone]

## 2021-04-15 ENCOUNTER — Encounter: Payer: Self-pay | Admitting: Urology

## 2021-04-15 ENCOUNTER — Other Ambulatory Visit: Payer: Self-pay

## 2021-04-15 ENCOUNTER — Ambulatory Visit: Payer: Medicare PPO | Admitting: Urology

## 2021-04-15 VITALS — BP 95/59 | HR 78 | Ht 68.0 in | Wt 136.0 lb

## 2021-04-15 DIAGNOSIS — N138 Other obstructive and reflux uropathy: Secondary | ICD-10-CM

## 2021-04-15 DIAGNOSIS — N401 Enlarged prostate with lower urinary tract symptoms: Secondary | ICD-10-CM

## 2021-04-15 DIAGNOSIS — R300 Dysuria: Secondary | ICD-10-CM

## 2021-04-15 LAB — BLADDER SCAN AMB NON-IMAGING: Scan Result: 131

## 2021-07-15 NOTE — Progress Notes (Signed)
08/16/21 9:31 PM   Joshua George 09/11/1941 [id number]  Referring provider:  Kandyce RudBabaoff, Marcus, MD [address] [facility] [place],  [place] [id number]  Chief Complaint  Patient presents with   Benign Prostatic Hypertrophy    Urological history: 1. Urinary retention -incidental finding of retention after presenting to the ED for delirium -1800 mL in the bladder -cysto 2022  Prominent lateral lobe enlargement prostate - Moderate median lobe - Moderate trabeculation -PVR 8 mL   2. BPH with retention -PSA 3.39 in 03/2021 -managed with tamsulosin 0.4 mg daily    HPI: Joshua George is a 80 y.o.male who presents today for 3 month follow-up with UA, IPSS and PVR.   He continues to have urgency and urge incontinence.  Patient denies any modifying or aggravating factors.  Patient denies any gross hematuria, dysuria or suprapubic/flank pain.  Patient denies any fevers, chills, nausea or vomiting.    UA > 30 WBC's and few bacteria  PVR 8 mL    IPSS     Row Name 07/16/21 1100         International Prostate Symptom Score   How often have you had the sensation of not emptying your bladder? Less than 1 in 5     How often have you had to urinate less than every two hours? About half the time     How often have you found you stopped and started again several times when you urinated? Less than half the time     How often have you found it difficult to postpone urination? Not at All     How often have you had a weak urinary stream? Less than 1 in 5 times     How often have you had to strain to start urination? More than half the time     How many times did you typically get up at night to urinate? 4 Times     Total IPSS Score 15       Quality of Life due to urinary symptoms   If you were to spend the rest of your life with your urinary condition just the way it is now how would you feel about that? Mostly Satisfied               Score:   1-7 Mild 8-19 Moderate 20-35 Severe     PMH: Past Medical History:  Diagnosis Date   BPH (benign prostatic hyperplasia)    Dementia (HCC)    GERD (gastroesophageal reflux disease)    Parkinson's disease (HCC)     Surgical History: Past Surgical History:  Procedure Laterality Date   TONSILLECTOMY      Home Medications:  Allergies as of 07/16/2021   No Known Allergies      Medication List        Accurate as of July 16, 2021 11:59 PM. If you have any questions, ask your nurse or doctor.          STOP taking these medications    glycopyrrolate 1 MG tablet Commonly known as: ROBINUL Stopped by: Michiel CowboySHANNON [place]on Marasco, PA-C       TAKE these medications    acetaminophen 325 MG tablet Commonly known as: TYLENOL Take 325 mg by mouth every 6 (six) hours as needed for mild pain.   carbidopa-levodopa-entacapone 37.5-150-200 MG tablet Commonly known as: STALEVO Take 1 tablet by mouth 4 (four) times daily.   clonazePAM 1 MG tablet Commonly known  as: KLONOPIN Take 1 tablet (1 mg total) by mouth at bedtime.   clonazePAM 1 MG tablet Commonly known as: KLONOPIN Take by mouth.   Cranberry 400 MG Caps Take by mouth.   Gocovri 137 MG Cp24 Generic drug: Amantadine HCl ER Take 1 capsule by mouth at bedtime.   melatonin 5 MG Tabs Take 1 tablet (5 mg total) by mouth at bedtime as needed.   omeprazole 20 MG capsule Commonly known as: PRILOSEC Take 1 capsule by mouth at bedtime.   polyethylene glycol powder 17 GM/SCOOP powder Commonly known as: GLYCOLAX/MIRALAX Take by mouth.   QUEtiapine 25 MG tablet Commonly known as: SEROQUEL TAKE 1 TABLET(25 MG) BY MOUTH EVERY NIGHT   rasagiline 1 MG Tabs tablet Commonly known as: AZILECT Take 1 mg by mouth daily.   tamsulosin 0.4 MG Caps capsule Commonly known as: FLOMAX Take 1 capsule (0.4 mg total) by mouth daily with lunch.        Allergies: No Known Allergies  Family History: No family history on  file.  Social History:  reports that he has never smoked. He has never used smokeless tobacco. He reports that he does not currently use alcohol. No history on file for drug use.   Physical Exam: BP (!) 179/75   Pulse (!) 56   Ht 5\' 8"  (1.727 m)   Wt 136 lb (61.7 kg)   BMI 20.68 kg/m   Constitutional:  Well nourished. Alert and oriented, No acute distress. HEENT: [place] AT, mask in place.  Trachea midline Cardiovascular: No clubbing, cyanosis, or edema. Respiratory: Normal respiratory effort, no increased work of breathing. Neurologic: Grossly intact, no focal deficits, moving all 4 extremities. Psychiatric: Normal mood and affect.   Laboratory Data: Urinalysis Component     Latest Ref Rng & Units 07/16/2021  Specific Gravity, UA     1.005 - 1.030 1.025  pH, UA     5.0 - 7.5 5.5  Color, UA     Yellow Orange  Appearance Ur     Clear Cloudy (A)  Leukocytes,UA     Negative 1+ (A)  Protein,UA     Negative/Trace Trace (A)  Glucose, UA     Negative Negative  Ketones, UA     Negative 1+ (A)  RBC, UA     Negative Trace (A)  Bilirubin, UA     Negative Negative  Urobilinogen, Ur     0.2 - 1.0 mg/dL 0.2  Nitrite, UA     Negative Negative  Microscopic Examination      See below:   Component     Latest Ref Rng & Units 07/16/2021  WBC, UA     0 - 5 /hpf >30 (A)  RBC     0 - 2 /hpf 0-2  Epithelial Cells (non renal)     0 - 10 /hpf 0-10  Bacteria, UA     None seen/Few Few  I have reviewed the labs.    Pertinent Imaging:  07/16/21 11:44  Scan Result 8    Assessment & Plan:    1. BPH with LUTS -PSA stable -UA benign -PVR < 300 cc -symptoms - urgency and urge incontinence -continue conservative management, avoiding bladder irritants and timed voiding's -Continue tamsulosin 0.4 mg daily   2. Dysuria - resolved   [facility] [address], [address] [place], [place] [phone]

## 2021-07-16 ENCOUNTER — Other Ambulatory Visit: Payer: Self-pay

## 2021-07-16 ENCOUNTER — Ambulatory Visit: Payer: Medicare PPO | Admitting: Urology

## 2021-07-16 ENCOUNTER — Encounter: Payer: Self-pay | Admitting: Urology

## 2021-07-16 VITALS — BP 179/75 | HR 56 | Ht 68.0 in | Wt 136.0 lb

## 2021-07-16 DIAGNOSIS — N401 Enlarged prostate with lower urinary tract symptoms: Secondary | ICD-10-CM | POA: Diagnosis not present

## 2021-07-16 DIAGNOSIS — R300 Dysuria: Secondary | ICD-10-CM | POA: Diagnosis not present

## 2021-07-16 DIAGNOSIS — N138 Other obstructive and reflux uropathy: Secondary | ICD-10-CM | POA: Diagnosis not present

## 2021-07-16 LAB — URINALYSIS, COMPLETE
Bilirubin, UA: NEGATIVE
Glucose, UA: NEGATIVE
Nitrite, UA: NEGATIVE
Specific Gravity, UA: 1.025 (ref 1.005–1.030)
Urobilinogen, Ur: 0.2 mg/dL (ref 0.2–1.0)
pH, UA: 5.5 (ref 5.0–7.5)

## 2021-07-16 LAB — MICROSCOPIC EXAMINATION: WBC, UA: >30 /hpf — AB (ref 0–5)

## 2021-07-16 LAB — BLADDER SCAN AMB NON-IMAGING: Scan Result: 8

## 2021-07-16 MED ORDER — TAMSULOSIN HCL 0.4 MG PO CAPS: 0.4000 mg | ORAL_CAPSULE | Freq: Every day | ORAL | 3 refills | Status: DC

## 2022-01-12 NOTE — Progress Notes (Signed)
01/13/22 ?11:35 AM  ? ?Joshua George ?1940/12/08 ?[id number] ? ?Referring provider:  ?Derinda Late, MD ?[address] ?[facility] ?[place],  [place] [address] ? ?Chief Complaint  ?Patient presents with  ? Benign Prostatic Hypertrophy  ? ? ?Urological history  ?1. Urinary retention ?-incidental finding of retention after presenting to the ED for delirium ?-1800 mL in the bladder ?-cysto 2022  Prominent lateral lobe enlargement prostate - Moderate median lobe - Moderate trabeculation ?-PVR 40 mL ?  ?2. BPH with retention ?-PSA 3.39 in 03/2021 ?-managed with tamsulosin 0.4 mg daily ? ? ?HPI: ?Joshua George is a 81 y.o.male who presents today for a 6 month follow-up with PVR with his wife, Joshua George.  ? ?He has urinary leakage which is mild.  Patient denies any modifying or aggravating factors.  Patient denies any gross hematuria, dysuria or suprapubic/flank pain.  Patient denies any fevers, chills, nausea or vomiting.   ? ?PMH: ?Past Medical History:  ?Diagnosis Date  ? BPH (benign prostatic hyperplasia)   ? Dementia ([facility])   ? GERD (gastroesophageal reflux disease)   ? Parkinson's disease ([facility])   ? ? ?Surgical History: ?Past Surgical History:  ?Procedure Laterality Date  ? TONSILLECTOMY    ? ? ?Home Medications:  ?Allergies as of 01/13/2022   ?No Known Allergies ?  ? ?  ?Medication List  ?  ? ?  ? Accurate as of Jan 13, 2022 11:35 AM. If you have any questions, ask your nurse or doctor.  ?  ?  ? ?  ? ?acetaminophen 325 MG tablet ?Commonly known as: TYLENOL ?Take 325 mg by mouth every 6 (six) hours as needed for mild pain. ?  ?carbidopa-levodopa-entacapone 37.5-150-200 MG tablet ?Commonly known as: STALEVO ?Take 1 tablet by mouth 4 (four) times daily. ?  ?clonazePAM 1 MG tablet ?Commonly known as: KLONOPIN ?Take 1 tablet (1 mg total) by mouth at bedtime. ?  ?clonazePAM 1 MG tablet ?Commonly known as: KLONOPIN ?Take by mouth. ?  ?Cranberry 400 MG Caps ?Take by mouth. ?  ?Gocovri 137 MG  Cp24 ?Generic drug: Amantadine HCl ER ?Take 1 capsule by mouth at bedtime. ?  ?melatonin 5 MG Tabs ?Take 1 tablet (5 mg total) by mouth at bedtime as needed. ?  ?omeprazole 20 MG capsule ?Commonly known as: PRILOSEC ?Take 1 capsule by mouth at bedtime. ?  ?polyethylene glycol powder 17 GM/SCOOP powder ?Commonly known as: GLYCOLAX/MIRALAX ?Take by mouth. ?  ?QUEtiapine 25 MG tablet ?Commonly known as: SEROQUEL ?TAKE 1 TABLET(25 MG) BY MOUTH EVERY NIGHT ?  ?rasagiline 1 MG Tabs tablet ?Commonly known as: AZILECT ?Take 1 mg by mouth daily. ?  ?tamsulosin 0.4 MG Caps capsule ?Commonly known as: FLOMAX ?Take 1 capsule (0.4 mg total) by mouth daily with lunch. ?  ? ?  ? ? ?Allergies:  ?No Known Allergies ? ?Family History: ?No family history on file. ? ?Social History:  reports that he has never smoked. He has never used smokeless tobacco. He reports that he does not currently use alcohol. No history on file for drug use. ? ? ?Physical Exam: ?BP 105/67   Pulse 67   Ht 5' 7"  (1.702 m)   Wt 142 lb (64.4 kg)   BMI 22.24 kg/m?   ?Constitutional:  Alert and oriented, No acute distress. ?HEENT: [place] AT, moist mucus membranes.  Trachea midline, no masses. ?Cardiovascular: No clubbing, cyanosis, or edema. ?Respiratory: Normal respiratory effort, no increased work of breathing. ?GI: Abdomen is soft,  nontender, nondistended, no abdominal masses ?GU: No CVA tenderness ?Lymph: No cervical or inguinal lymphadenopathy. ?Skin: No rashes, bruises or suspicious lesions. ?Neurologic: Grossly intact, no focal deficits, moving all 4 extremities. ?Psychiatric: Normal mood and affect. ? ?Laboratory Data: ? Ref Range & Units 3 mo ago  ?Glucose 70 - 110 mg/dL 83   ?Sodium 136 - 145 mmol/L 142   ?Potassium 3.6 - 5.1 mmol/L 4.7   ?Chloride 97 - 109 mmol/L 105   ?Carbon Dioxide (CO2) 22.0 - 32.0 mmol/L 32.6 High    ?Urea Nitrogen (BUN) 7 - 25 mg/dL 26 High    ?Creatinine 0.7 - 1.3 mg/dL 1.2   ?Glomerular Filtration Rate (eGFR), MDRD Estimate >60  mL/min/1.73sq m 58 Low    ?Calcium 8.7 - 10.3 mg/dL 9.2   ?AST  8 - 39 U/L 17   ?ALT  6 - 57 U/L 3 Low    ?Alk Phos (alkaline Phosphatase) 34 - 104 U/L 87   ?Albumin 3.5 - 4.8 g/dL 4.0   ?Bilirubin, Total 0.3 - 1.2 mg/dL 0.7   ?Protein, Total 6.1 - 7.9 g/dL 6.6   ?A/G Ratio 1.0 - 5.0 gm/dL 1.5   ?Resulting Agency  [facility]  ?Specimen Collected: 10/04/21 09:04 Last Resulted: 10/04/21 15:58  ?Received From: [facility]  Result Received: 01/12/22 12:52  ? ? Ref Range & Units 3 mo ago  ?WBC (White Blood Cell Count) 4.1 - 10.2 10?3/uL 6.0   ?RBC (Red Blood Cell Count) 4.69 - 6.13 10?6/uL 4.37 Low    ?Hemoglobin 14.1 - 18.1 gm/dL 13.1 Low    ?Hematocrit 40.0 - 52.0 % 40.6   ?MCV (Mean Corpuscular Volume) 80.0 - 100.0 fl 92.9   ?MCH (Mean Corpuscular Hemoglobin) 27.0 - 31.2 pg 30.0   ?MCHC (Mean Corpuscular Hemoglobin Concentration) 32.0 - 36.0 gm/dL 32.3   ?Platelet Count 150 - 450 10?3/uL 158   ?RDW-CV (Red Cell Distribution Width) 11.6 - 14.8 % 12.9   ?MPV (Mean Platelet Volume) 9.4 - 12.4 fl 12.0   ?Neutrophils 1.50 - 7.80 10?3/uL 3.85   ?Lymphocytes 1.00 - 3.60 10?3/uL 1.08   ?Monocytes 0.00 - 1.50 10?3/uL 0.74   ?Eosinophils 0.00 - 0.55 10?3/uL 0.22   ?Basophils 0.00 - 0.09 10?3/uL 0.06   ?Neutrophil % 32.0 - 70.0 % 64.5   ?Lymphocyte % 10.0 - 50.0 % 18.1   ?Monocyte % 4.0 - 13.0 % 12.4   ?Eosinophil % 1.0 - 5.0 % 3.7   ?Basophil% 0.0 - 2.0 % 1.0   ?Immature Granulocyte % <=0.7 % 0.3   ?Immature Granulocyte Count <=0.06 10^3/?L 0.02   ?Resulting Agency  [facility]  ?Specimen Collected: 10/04/21 09:04 Last Resulted: 10/04/21 15:50  ?Received From: [facility]  Result Received: 01/12/22 12:52  ?I have reviewed the labs.  ? ?Pertinent Imaging: ? 01/13/22 11:34  ?Scan Result 83m  ? ? ?Assessment & Plan:   ? ?1. BPH with LU TS ?-PVR < 300 cc ?-continue tamsulosin 0.4 mg daily ? ?Return in about 6 months (around 07/16/2022) for PVR . ? ?Khang Hannum,  PA-C  ? ?[facility]?[address] ?[place] [address]?[phone] ? ?

## 2022-01-13 ENCOUNTER — Ambulatory Visit: Payer: Medicare PPO | Admitting: Urology

## 2022-01-13 ENCOUNTER — Encounter: Payer: Self-pay | Admitting: Urology

## 2022-01-13 VITALS — BP 105/67 | HR 67 | Ht 67.0 in | Wt 142.0 lb

## 2022-01-13 DIAGNOSIS — N138 Other obstructive and reflux uropathy: Secondary | ICD-10-CM | POA: Diagnosis not present

## 2022-01-13 DIAGNOSIS — N401 Enlarged prostate with lower urinary tract symptoms: Secondary | ICD-10-CM

## 2022-01-13 DIAGNOSIS — R339 Retention of urine, unspecified: Secondary | ICD-10-CM | POA: Diagnosis not present

## 2022-01-13 LAB — BLADDER SCAN AMB NON-IMAGING

## 2022-03-24 IMAGING — US US THORACENTESIS ASP PLEURAL SPACE W/IMG GUIDE
1 series · 5 of 5 positions shown · non-contrast
Comparison: none

INDICATION: Patient with community acquired pneumonia presents today for a
therapeutic and diagnostic thoracentesis.

[Series 1: us thoracentesis asp pleural s · 5 of 5 slices shown]
[im 1/5]
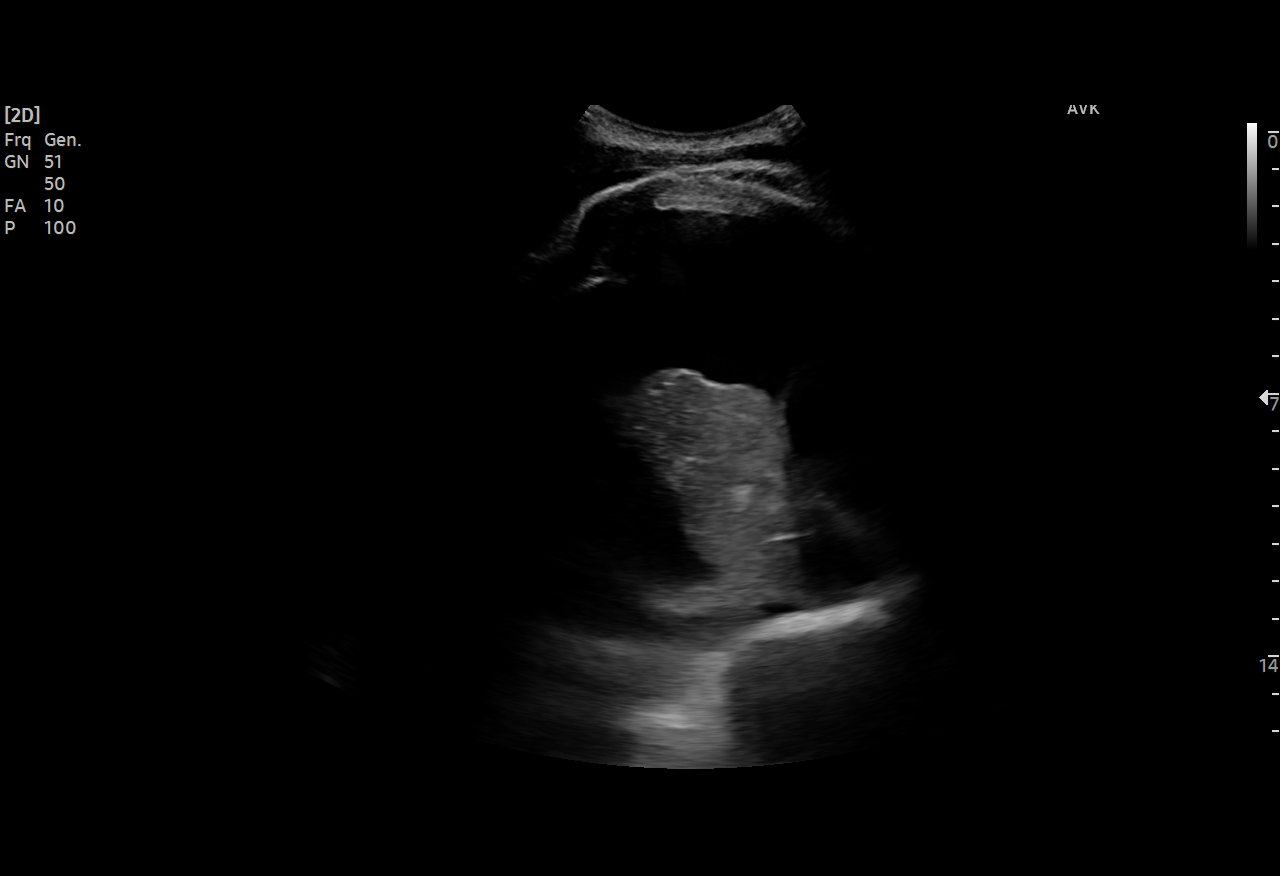
[im 2/5]
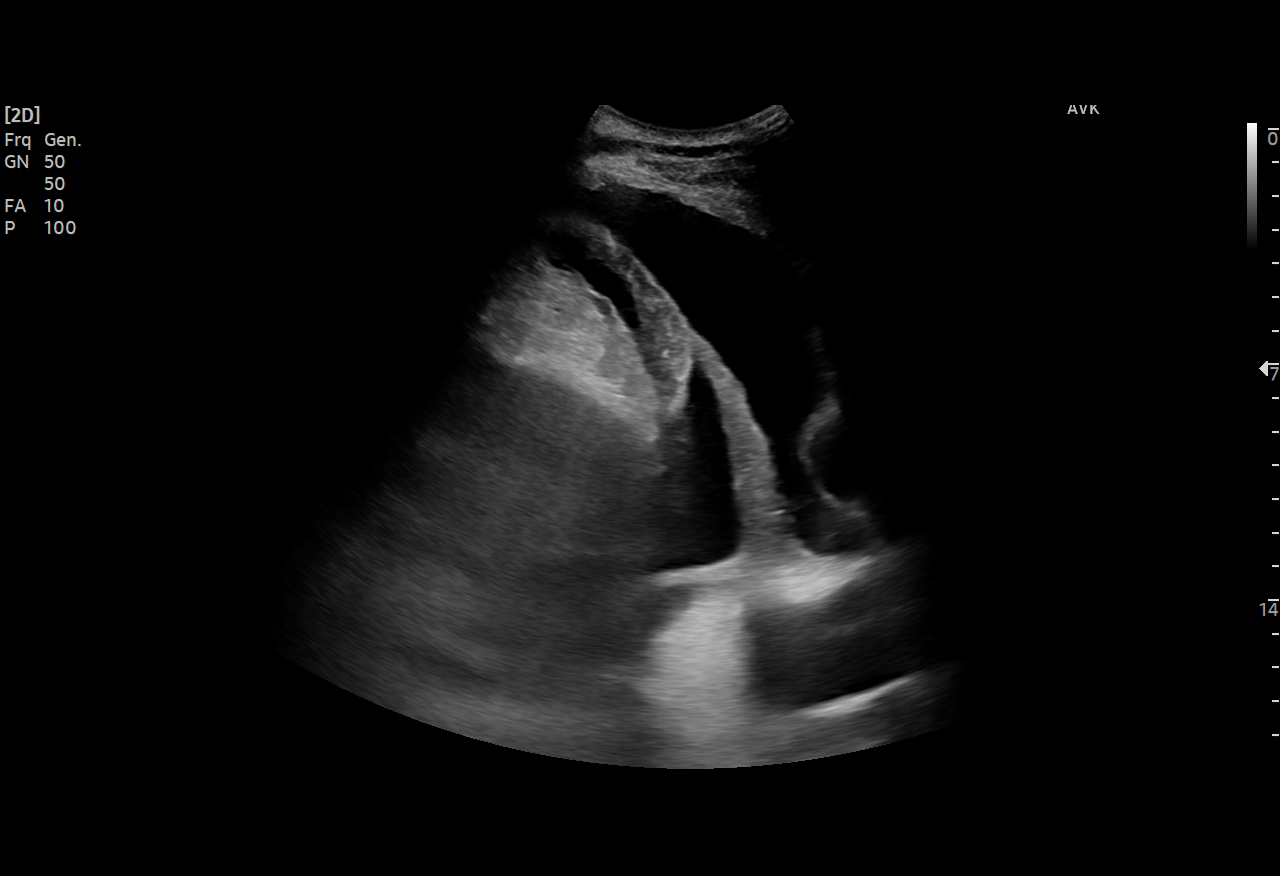
[im 3/5]
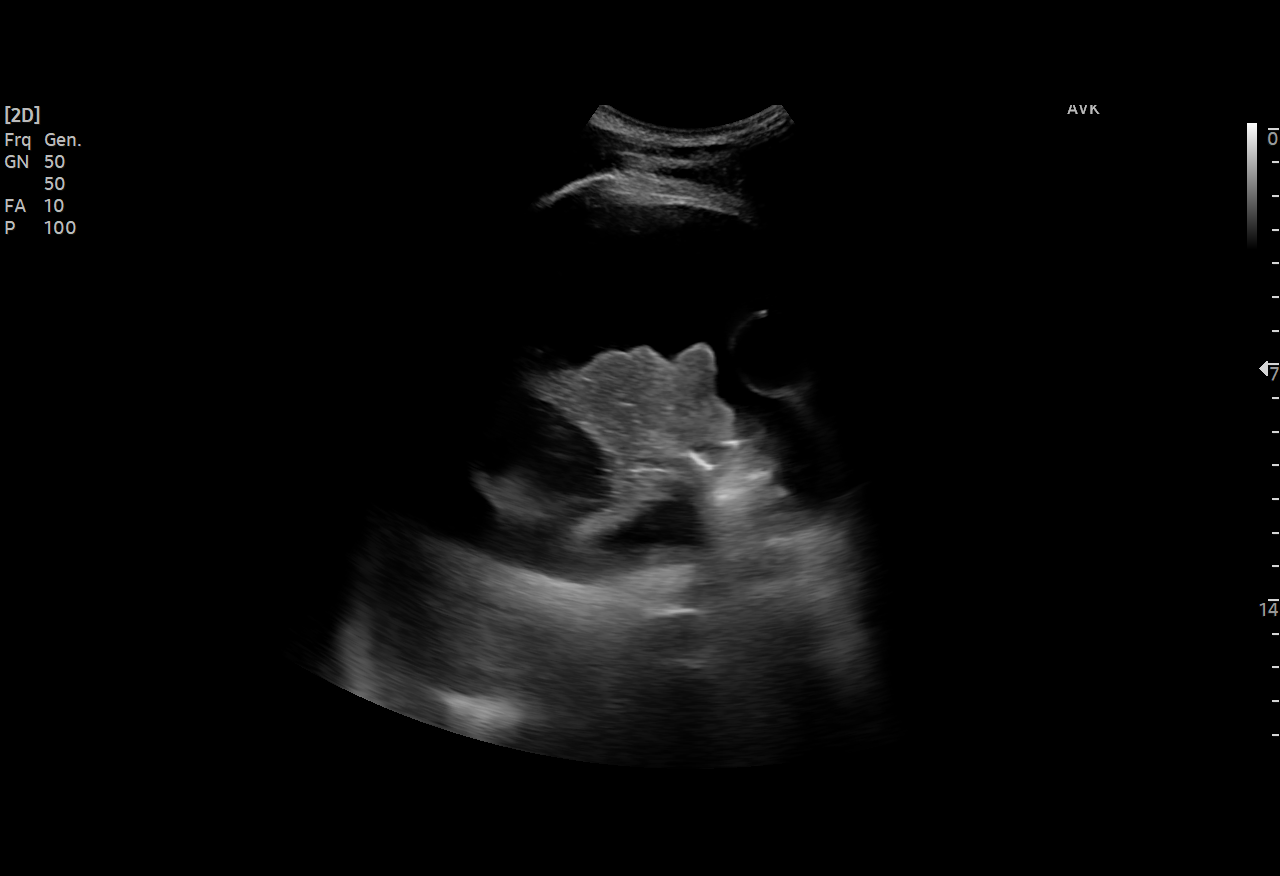
[im 4/5]
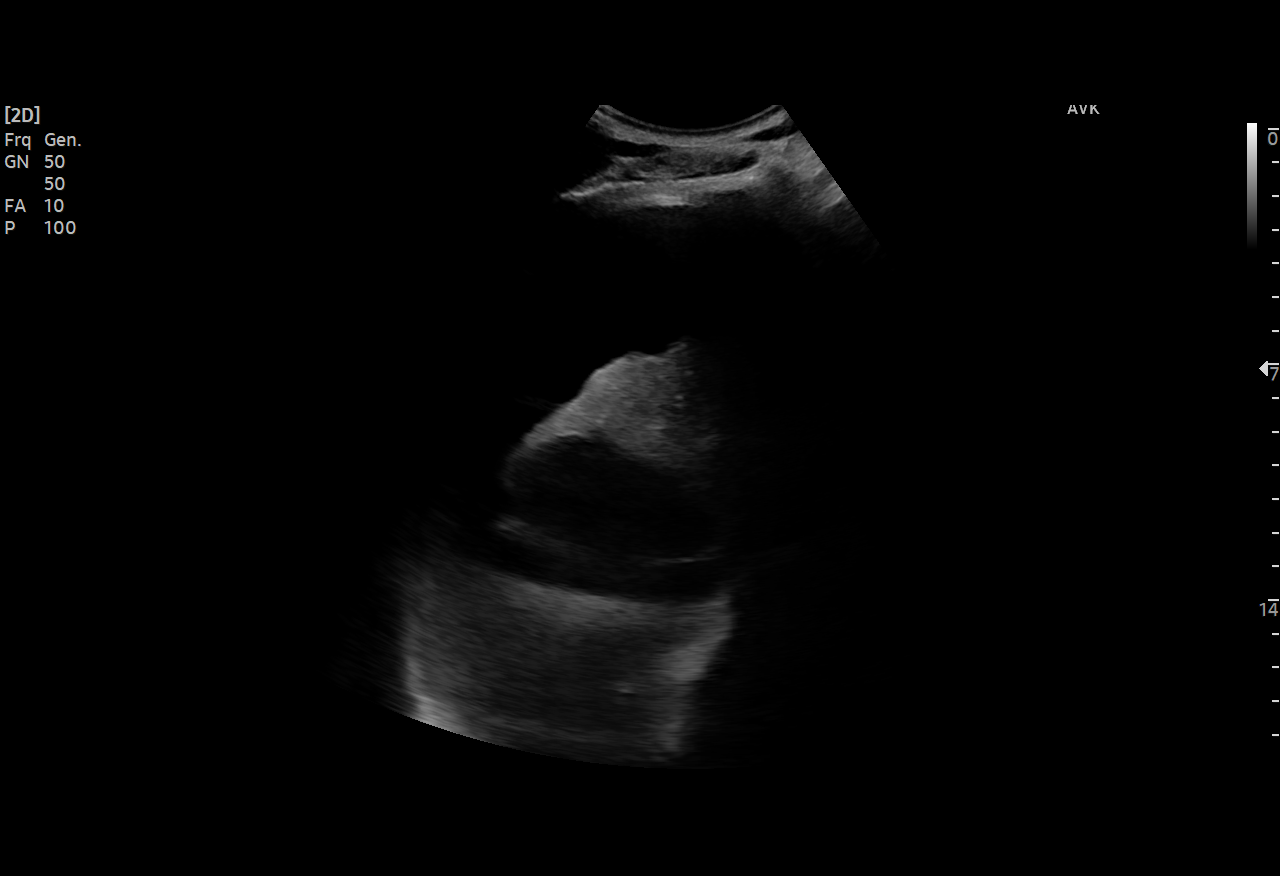
[im 5/5]
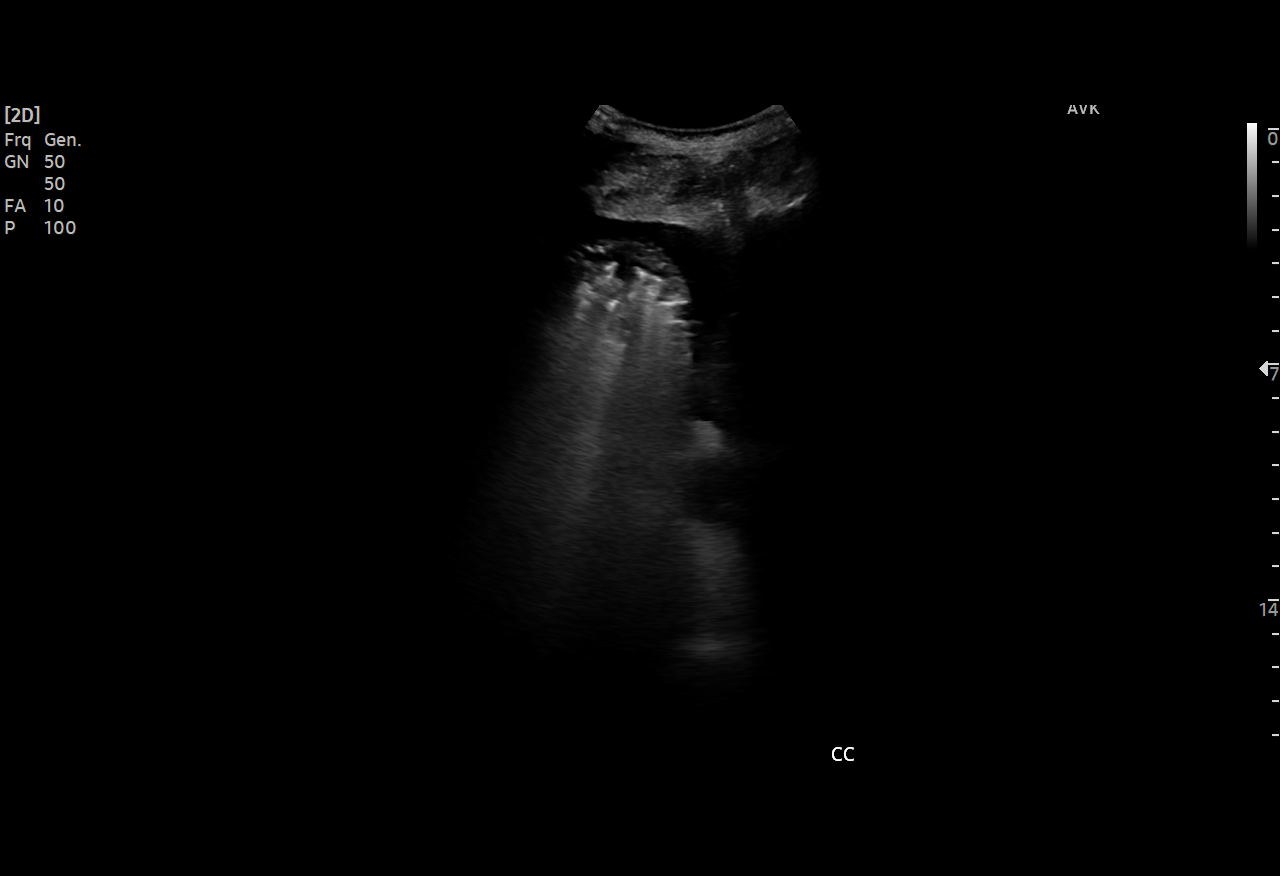

[5 of 5 positions shown; findings below may reference images not displayed]

EXAM:
ULTRASOUND GUIDED THORACENTESIS

MEDICATIONS:
1% lidocaine 10 mL

COMPLICATIONS:
None immediate.

PROCEDURE:
An ultrasound guided thoracentesis was thoroughly discussed with the
patient and questions answered. The benefits, risks, alternatives
and complications were also discussed. The patient understands and
wishes to proceed with the procedure. Written consent was obtained.

Ultrasound was performed to localize and mark an adequate pocket of
fluid in the left chest. The area was then prepped and draped in the
normal sterile fashion. 1% Lidocaine was used for local anesthesia.
Under ultrasound guidance a 6 Fr Safe-T-Centesis catheter was
introduced. Thoracentesis was performed. The catheter was removed
and a dressing applied.
FINDINGS: A total of approximately 2758 ml of yellow fluid was removed.
Samples were sent to the laboratory as requested by the clinical
team.
IMPRESSION: Successful ultrasound guided left thoracentesis yielding 2758 ml of
pleural fluid.

## 2022-03-24 IMAGING — DX DG CHEST 1V PORT
1 series · 1 of 1 positions shown · non-contrast
Comparison: None.

CLINICAL DATA: Hypoxia

EXAM:
PORTABLE CHEST 1 VIEW

[chest ap]
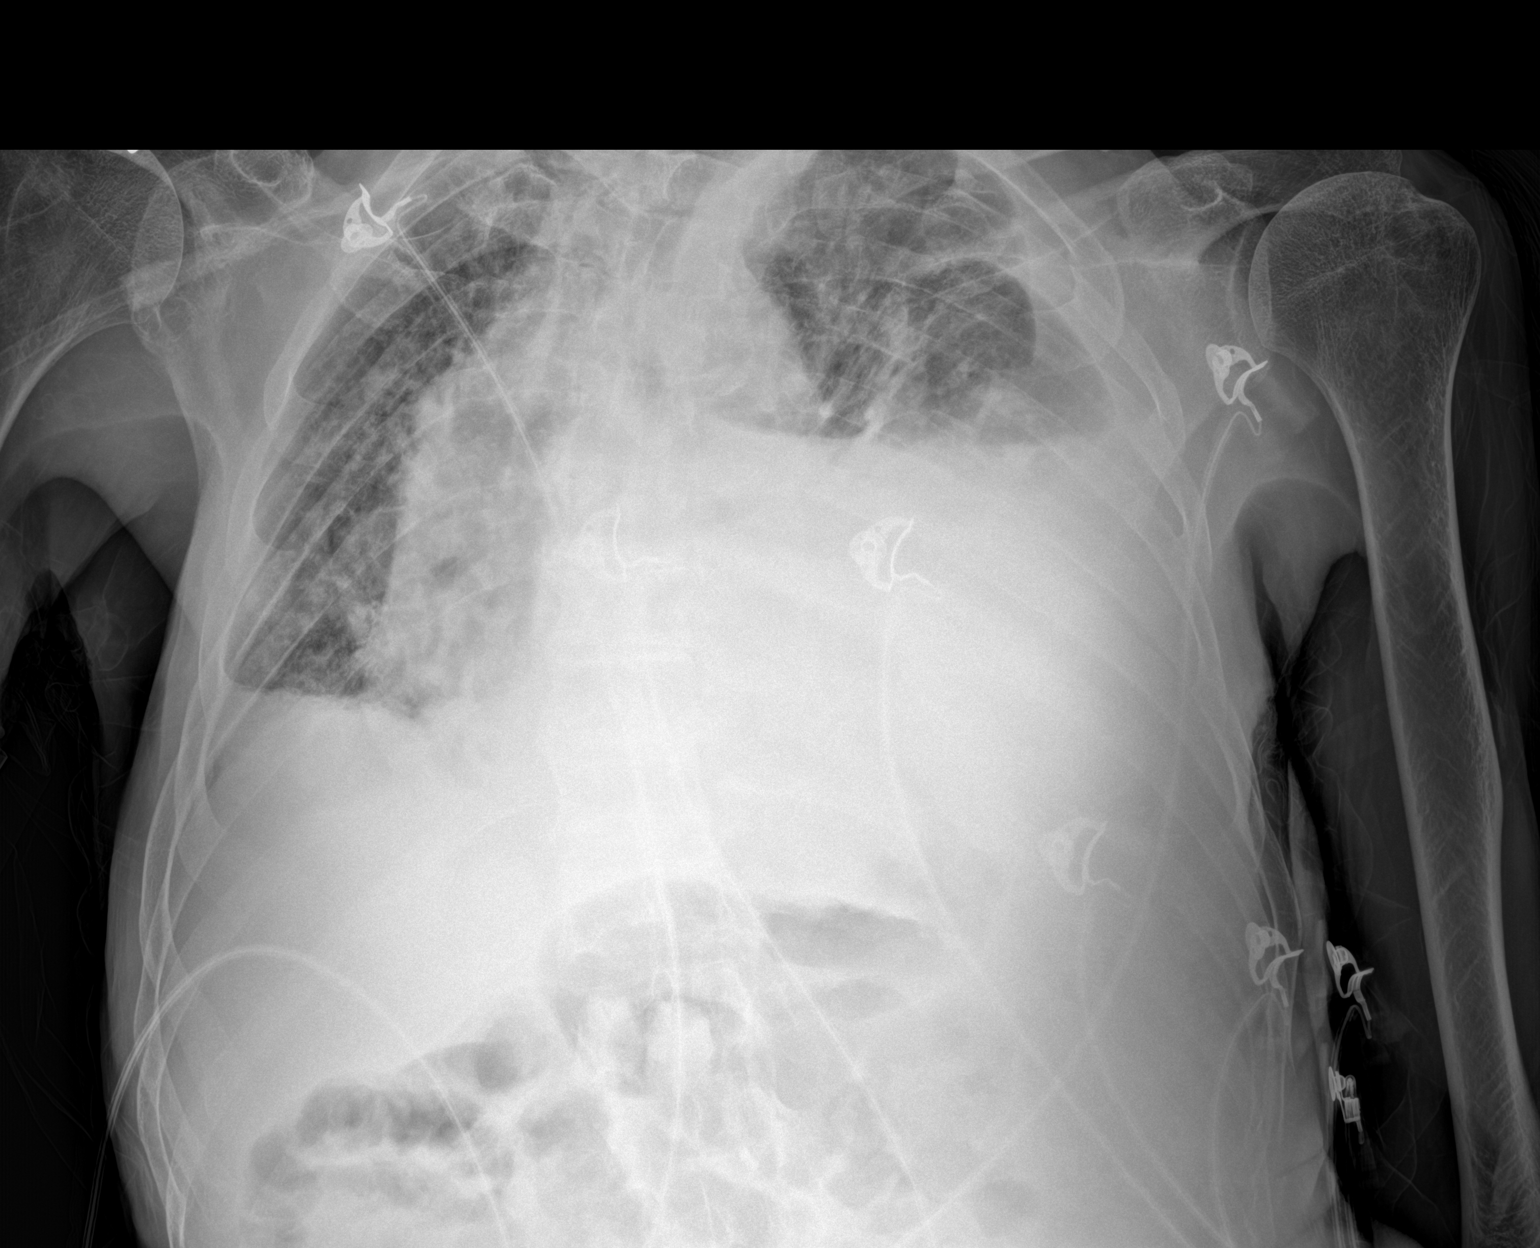

[1 of 1 positions shown; findings below may reference images not displayed]

FINDINGS: Cardiac shadow is mildly prominent. Bilateral pleural effusions are
noted left significantly greater than right with some changes of
vascular congestion identified as well. No bony abnormality is
noted.
IMPRESSION: Large left-sided pleural effusion with likely underlying
atelectasis/infiltrate. Small right-sided pleural effusion is noted
as well.

## 2022-03-24 IMAGING — DX DG CHEST 1V PORT
1 series · 1 of 1 positions shown · non-contrast
Comparison: Earlier film, same date.

CLINICAL DATA: Status post left-sided thoracentesis.

EXAM:
PORTABLE CHEST 1 VIEW

[chest ap]
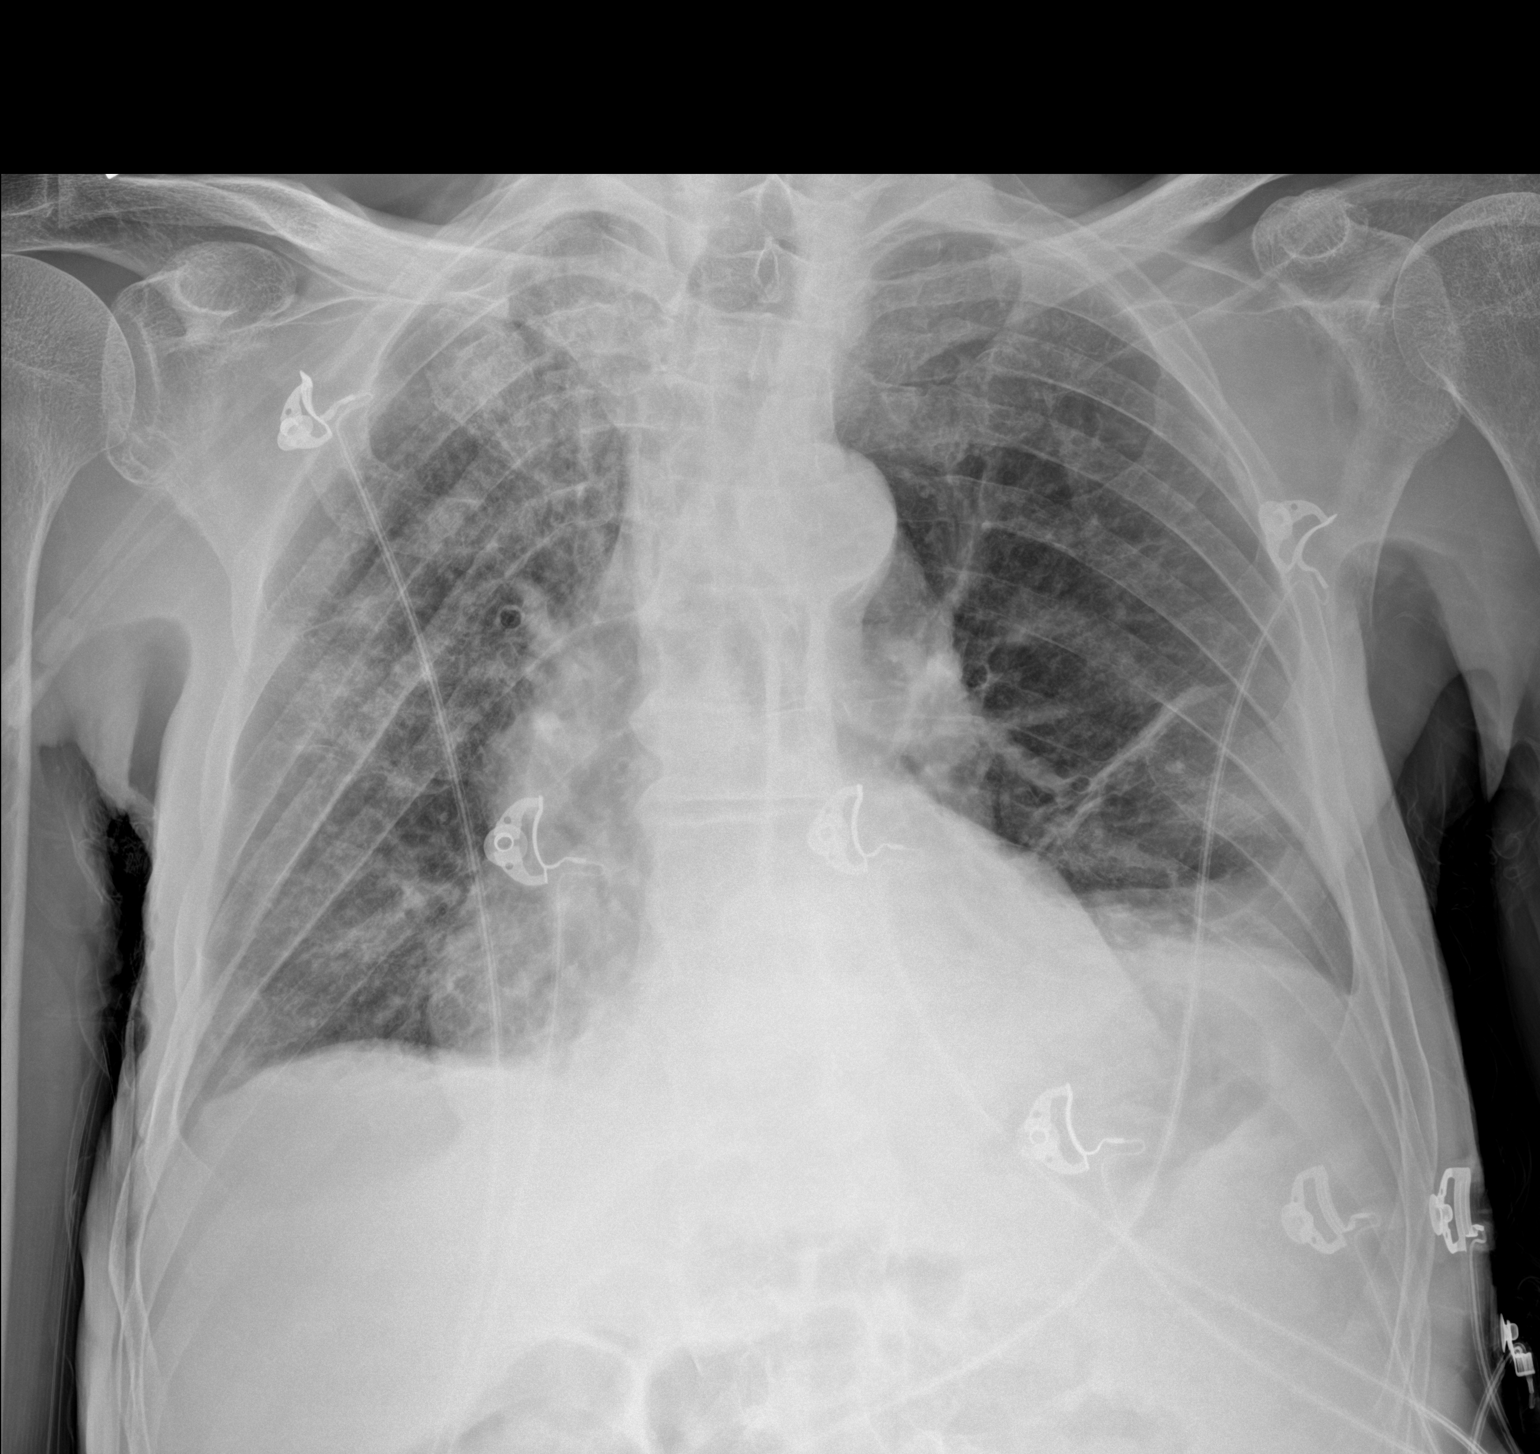

[1 of 1 positions shown; findings below may reference images not displayed]

FINDINGS: Near complete evacuation of the left pleural effusion. Small
residual left pleural effusion. No postprocedural pneumothorax.
Streaky left basilar atelectasis.

Stable underlying lung disease.
IMPRESSION: Status post left thoracentesis with near complete evacuation of the
left pleural effusion and no postprocedural pneumothorax.

## 2022-03-26 IMAGING — CR DG CHEST 2V
2 series · 2 of 2 positions shown · non-contrast
Comparison: 10/20/2020

CLINICAL DATA: History of pleural effusion

EXAM:
CHEST - 2 VIEW

[chest lat]
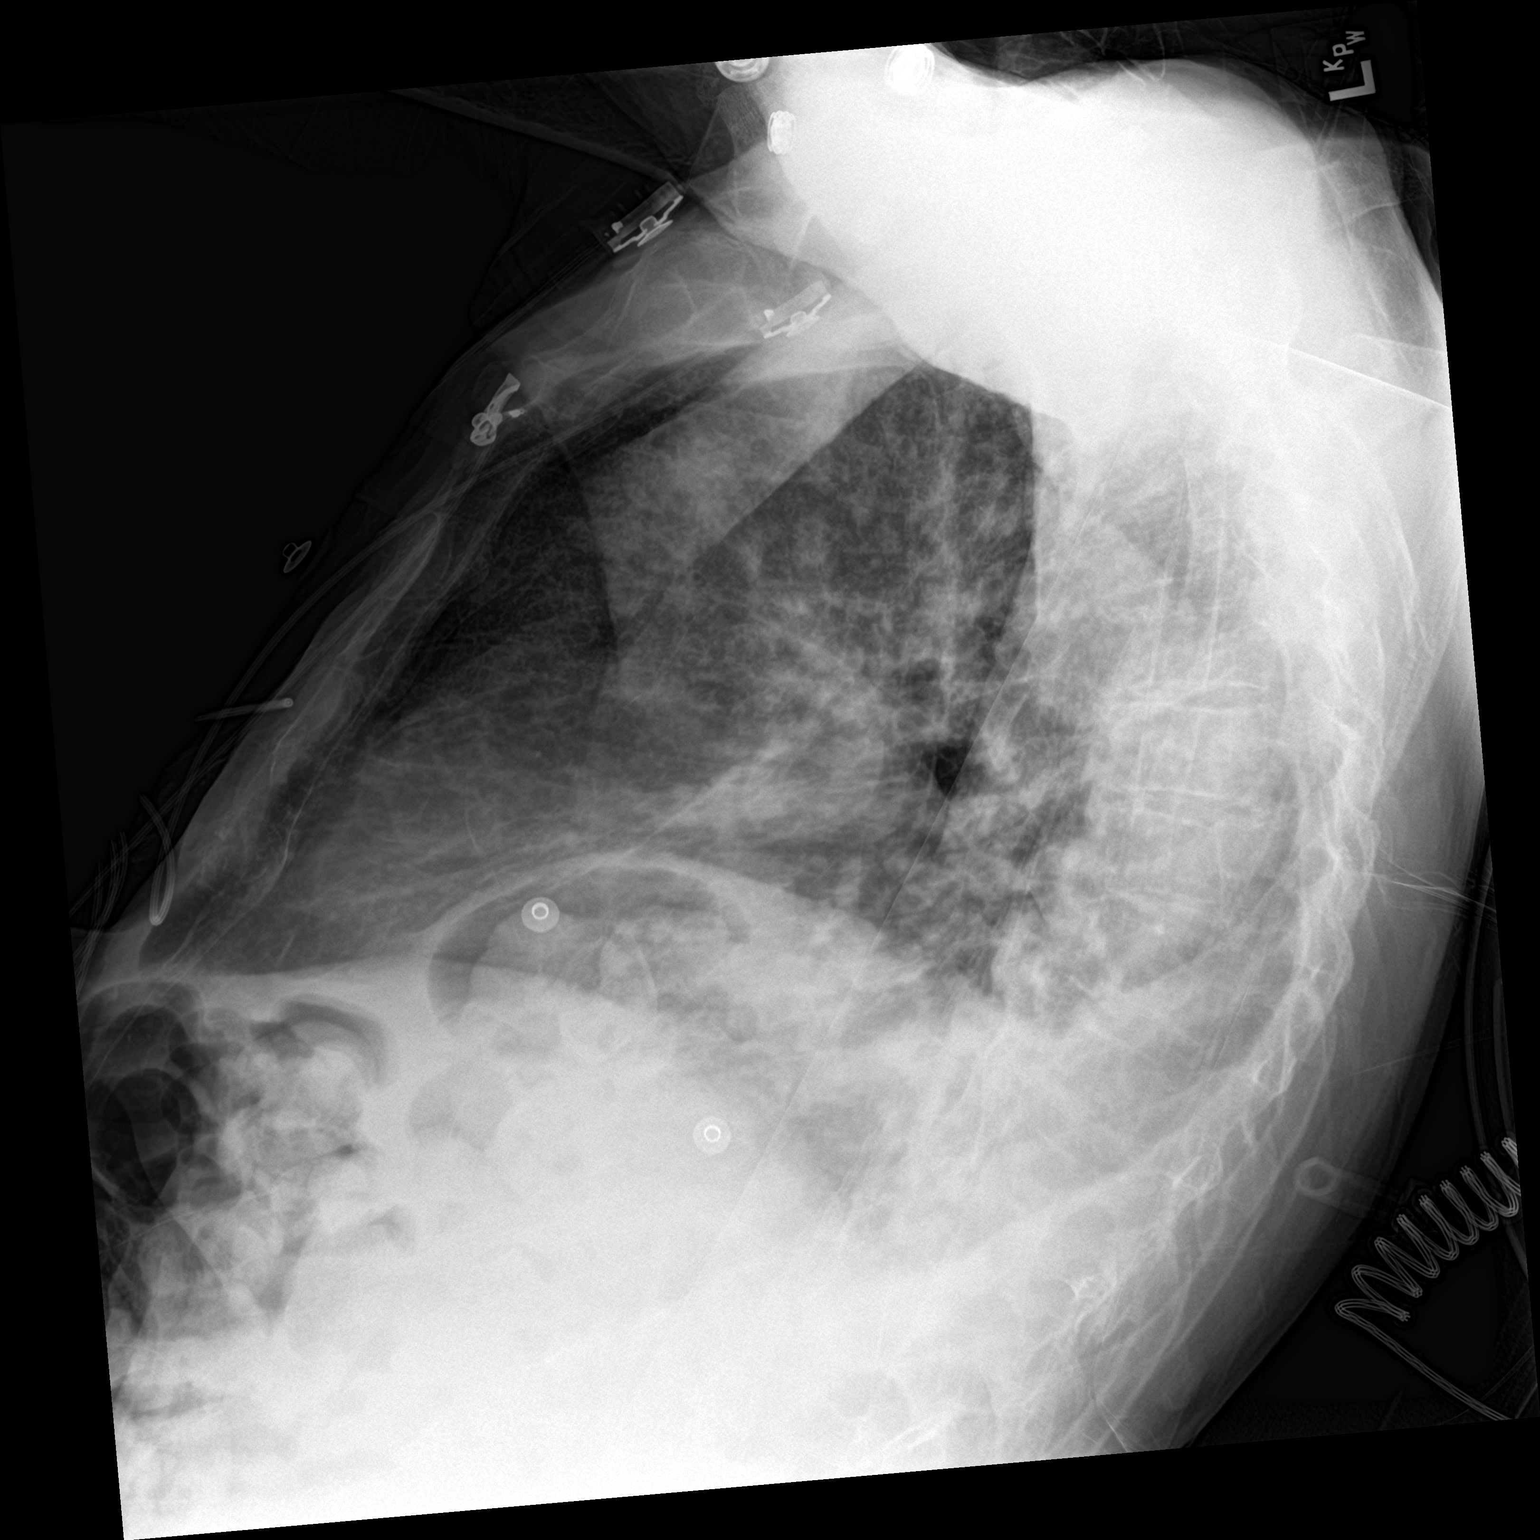

[chest ap]
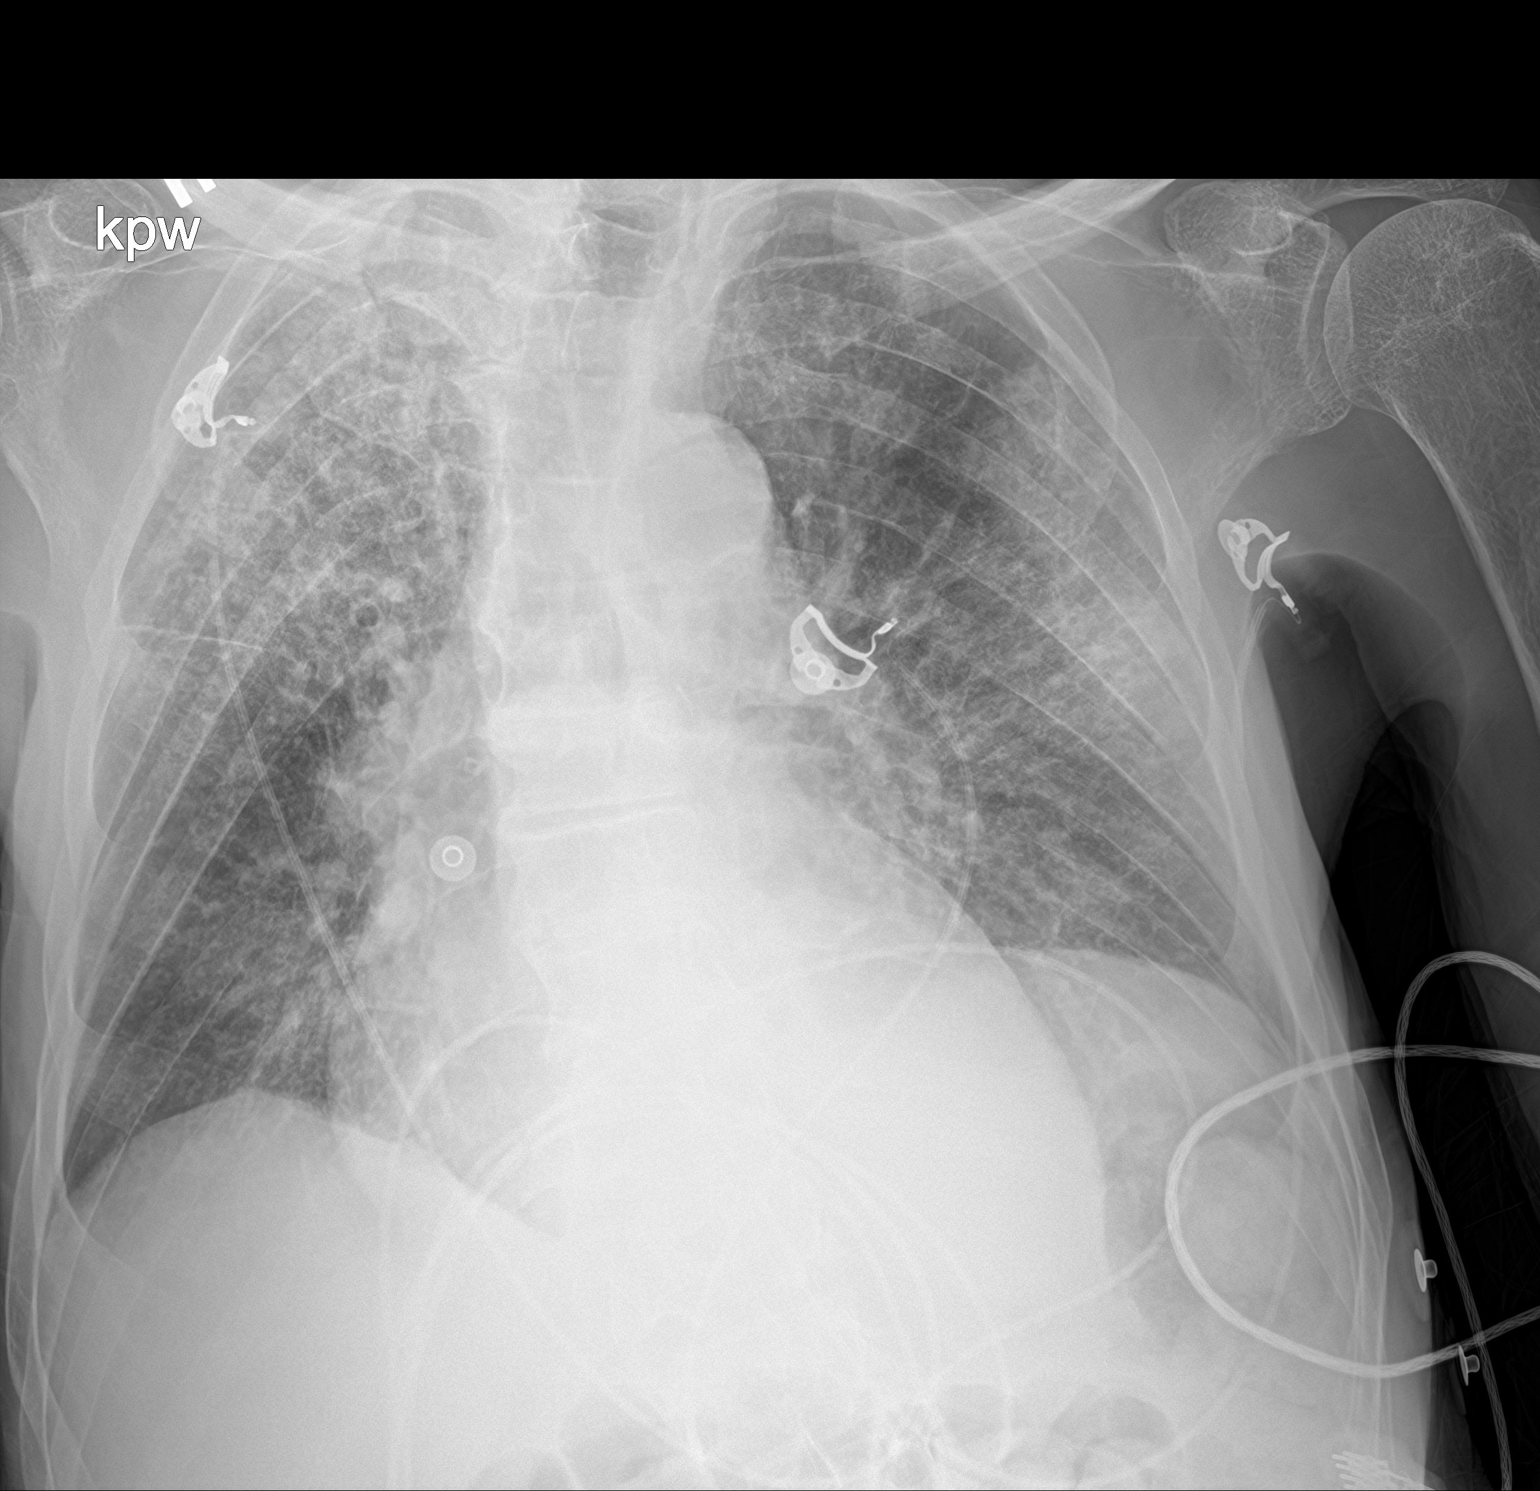

[2 of 2 positions shown; findings below may reference images not displayed]

FINDINGS: Cardiac shadow is stable. Small posterior pleural effusions are
noted. Increasing interstitial changes noted bilaterally likely
representing edema. No pneumothorax is seen. No bony abnormality is
noted.
IMPRESSION: Changes most consistent with increasing parenchymal edema and small
effusions.

## 2022-03-28 NOTE — Progress Notes (Unsigned)
03/29/22 1:23 PM   Joshua George [birth year] [id number]  Referring provider:  Kandyce Rud, MD [address] [facility] [place],  [place] [address]  Urological history  1. Urinary retention -incidental finding of retention after presenting to the ED for delirium -1800 mL in the bladder -cysto 2022  Prominent lateral lobe enlargement prostate - Moderate median lobe - Moderate trabeculation -PVR 0 mL   2. BPH with retention -PSA 3.39 in 03/2021 -managed with tamsulosin 0.4 mg daily  Recurrent UTI   HPI: Joshua George is a 81 y.o.male who presents today for a UTI with his wife, Joshua George.   He has been complaining of not feeling well, malaise, having hallucinations, increase in urgency/urinary leakage.    Patient denies any modifying or aggravating factors.  Patient denies any gross hematuria, dysuria or suprapubic/flank pain.  Patient denies any fevers, chills, nausea or vomiting.    UA nitrate positive, greater than 30 WBCs, 3-10 RBCs, calcium oxalate crystals and moderate bacteria  PVR 0 mL   PMH: Past Medical History:  Diagnosis Date   BPH (benign prostatic hyperplasia)    Dementia (HCC)    GERD (gastroesophageal reflux disease)    Parkinson's disease (HCC)     Surgical History: Past Surgical History:  Procedure Laterality Date   TONSILLECTOMY      Home Medications:  Allergies as of 03/29/2022   No Known Allergies      Medication List        Accurate as of March 29, 2022  1:23 PM. If you have any questions, ask your nurse or doctor.          acetaminophen 325 MG tablet Commonly known as: TYLENOL Take 325 mg by mouth every 6 (six) hours as needed for mild pain.   carbidopa-levodopa-entacapone 37.5-150-200 MG tablet Commonly known as: STALEVO Take 1 tablet by mouth 4 (four) times daily.   clonazePAM 1 MG tablet Commonly known as: KLONOPIN Take 1 tablet (1 mg total) by mouth at bedtime.   clonazePAM 1 MG  tablet Commonly known as: KLONOPIN Take by mouth.   Cranberry 400 MG Caps Take by mouth.   Gocovri 137 MG Cp24 Generic drug: Amantadine HCl ER Take 1 capsule by mouth at bedtime.   melatonin 5 MG Tabs Take 1 tablet (5 mg total) by mouth at bedtime as needed.   omeprazole 20 MG capsule Commonly known as: PRILOSEC Take 1 capsule by mouth at bedtime.   polyethylene glycol powder 17 GM/SCOOP powder Commonly known as: GLYCOLAX/MIRALAX Take by mouth.   QUEtiapine 25 MG tablet Commonly known as: SEROQUEL TAKE 1 TABLET(25 MG) BY MOUTH EVERY NIGHT   rasagiline 1 MG Tabs tablet Commonly known as: AZILECT Take 1 mg by mouth daily.   sulfamethoxazole-trimethoprim 800-160 MG tablet Commonly known as: BACTRIM DS Take 1 tablet by mouth every 12 (twelve) hours. Started by: Michiel Cowboy, PA-C   tamsulosin 0.4 MG Caps capsule Commonly known as: FLOMAX Take 1 capsule (0.4 mg total) by mouth daily with lunch.        Allergies:  No Known Allergies  Family History: History reviewed. No pertinent family history.  Social History:  reports that he has never smoked. He has never used smokeless tobacco. He reports that he does not currently use alcohol. No history on file for drug use.   Physical Exam: BP (!) 144/84   Pulse 69   Ht 5\' 7"  (1.702 m)   Wt 135 lb (61.2 kg)  BMI 21.14 kg/m   Constitutional:  Well nourished. Alert and oriented, No acute distress. HEENT: [place] AT, moist mucus membranes.  Trachea midline Cardiovascular: No clubbing, cyanosis, or edema. Respiratory: Normal respiratory effort, no increased work of breathing. Neurologic: Grossly intact, no focal deficits, moving all 4 extremities. Psychiatric: Normal mood and affect.   Laboratory Data: Urinalysis See HPI and Epic. I have reviewed the labs.   Pertinent Imaging:  Most Recent  Scan Result 0 03/29/22 12:57     Assessment & Plan:    1. Suspected UTI -UA grossly infected -Urine will be sent for  culture -Septra DS twice daily sent to pharmacy while we await urine culture results will adjust if necessary once results are available  2. BPH with LU TS -PVR < 300 cc -continue tamsulosin 0.4 mg daily  3. Microscopic hematuria -We will recheck UA in 1 month after UTI has been treated to ensure clearance of the micro heme  Return in about 1 month (around 04/29/2022) for UA and symptom recheck.  Michiel Cowboy, PA-C   [facility] [address] [place], [place] [address] [phone]

## 2022-03-29 ENCOUNTER — Encounter: Payer: Self-pay | Admitting: Urology

## 2022-03-29 ENCOUNTER — Ambulatory Visit: Payer: Medicare PPO | Admitting: Urology

## 2022-03-29 VITALS — BP 144/84 | HR 69 | Ht 67.0 in | Wt 135.0 lb

## 2022-03-29 DIAGNOSIS — N401 Enlarged prostate with lower urinary tract symptoms: Secondary | ICD-10-CM

## 2022-03-29 DIAGNOSIS — R3129 Other microscopic hematuria: Secondary | ICD-10-CM | POA: Diagnosis not present

## 2022-03-29 DIAGNOSIS — R339 Retention of urine, unspecified: Secondary | ICD-10-CM

## 2022-03-29 DIAGNOSIS — N390 Urinary tract infection, site not specified: Secondary | ICD-10-CM

## 2022-03-29 DIAGNOSIS — N138 Other obstructive and reflux uropathy: Secondary | ICD-10-CM | POA: Diagnosis not present

## 2022-03-29 DIAGNOSIS — R3989 Other symptoms and signs involving the genitourinary system: Secondary | ICD-10-CM

## 2022-03-29 LAB — MICROSCOPIC EXAMINATION: WBC, UA: >30 /hpf — AB (ref 0–5)

## 2022-03-29 LAB — URINALYSIS, COMPLETE
Bilirubin, UA: NEGATIVE
Nitrite, UA: POSITIVE — AB
Specific Gravity, UA: >1.03 — ABNORMAL HIGH (ref 1.005–1.030)
Urobilinogen, Ur: 0.2 mg/dL (ref 0.2–1.0)
pH, UA: 5 (ref 5.0–7.5)

## 2022-03-29 LAB — BLADDER SCAN AMB NON-IMAGING: Scan Result: 0

## 2022-03-29 MED ORDER — SULFAMETHOXAZOLE-TRIMETHOPRIM 800-160 MG PO TABS: 1.0000 | ORAL_TABLET | Freq: Two times a day (BID) | ORAL | 0 refills | Status: DC

## 2022-04-01 ENCOUNTER — Telehealth: Payer: Self-pay

## 2022-04-01 LAB — CULTURE, URINE COMPREHENSIVE

## 2022-04-01 NOTE — Telephone Encounter (Signed)
-----   Message from Harle Battiest, PA-C sent at 04/01/2022  8:12 AM EDT ----- Please let the [facility]'s know that the urine culture was positive for infection at the Septra DS is an appropriate antibiotic and to complete the prescription and we will see them in a month.

## 2022-04-01 NOTE — Telephone Encounter (Signed)
Notified pt as advised, pt expressed understanding.  ?

## 2022-04-29 ENCOUNTER — Encounter: Payer: Self-pay | Admitting: Urology

## 2022-04-29 ENCOUNTER — Ambulatory Visit: Payer: Medicare PPO | Admitting: Urology

## 2022-04-29 VITALS — BP 112/72 | HR 68 | Ht 67.0 in | Wt 135.0 lb

## 2022-04-29 DIAGNOSIS — R3129 Other microscopic hematuria: Secondary | ICD-10-CM | POA: Diagnosis not present

## 2022-04-29 DIAGNOSIS — N401 Enlarged prostate with lower urinary tract symptoms: Secondary | ICD-10-CM | POA: Diagnosis not present

## 2022-04-29 DIAGNOSIS — N138 Other obstructive and reflux uropathy: Secondary | ICD-10-CM

## 2022-04-29 DIAGNOSIS — R339 Retention of urine, unspecified: Secondary | ICD-10-CM | POA: Diagnosis not present

## 2022-04-29 DIAGNOSIS — R3989 Other symptoms and signs involving the genitourinary system: Secondary | ICD-10-CM

## 2022-04-29 LAB — URINALYSIS, COMPLETE

## 2022-04-29 LAB — MICROSCOPIC EXAMINATION: WBC, UA: >30 /hpf — AB (ref 0–5)

## 2022-04-29 MED ORDER — NITROFURANTOIN MONOHYD MACRO 100 MG PO CAPS: 100.0000 mg | ORAL_CAPSULE | Freq: Two times a day (BID) | ORAL | 0 refills | Status: DC

## 2022-04-29 NOTE — Progress Notes (Signed)
04/29/22 11:52 AM   Fenris Payment 1941/01/01 [id number]  Referring provider:  Derinda Late, MD [address] [facility] [place],  [place] [address]  Urological history   1. Urinary retention -incidental finding of retention after presenting to the ED for delirium -1800 mL in the bladder -cysto 2022  Prominent lateral lobe enlargement prostate - Moderate median lobe - Moderate trabeculation   2. BPH with retention -PSA 3.39 in 03/2021 -managed with tamsulosin 0.4 mg daily   Chief Complaint  Patient presents with   Follow-up    HPI: Joshua George is a 81 y.o.male who returns  for a 1 month follow-up for symptoms and UA recheck after UTI.  He was accompanied by his wife today.   He states he does not feel that the UTI has been fully treated.  He cannot be more specific with his symptoms.  Patient denies any modifying or aggravating factors.  Patient denies any gross hematuria, dysuria or suprapubic/flank pain.  Patient denies any fevers, chills, nausea or vomiting.    UA today shows >30 WBCs and 3-10 RBCs .   PMH: Past Medical History:  Diagnosis Date   BPH (benign prostatic hyperplasia)    Dementia ([facility])    GERD (gastroesophageal reflux disease)    Parkinson's disease ([facility])     Surgical History: Past Surgical History:  Procedure Laterality Date   TONSILLECTOMY      Home Medications:  Allergies as of 04/29/2022   No Known Allergies      Medication List        Accurate as of April 29, 2022 11:52 AM. If you have any questions, ask your nurse or doctor.          STOP taking these medications    sulfamethoxazole-trimethoprim 800-160 MG tablet Commonly known as: BACTRIM DS       TAKE these medications    acetaminophen 325 MG tablet Commonly known as: TYLENOL Take 325 mg by mouth every 6 (six) hours as needed for mild pain.   carbidopa-levodopa-entacapone 37.5-150-200 MG tablet Commonly known as:  STALEVO Take 1 tablet by mouth 4 (four) times daily.   clonazePAM 1 MG tablet Commonly known as: KLONOPIN Take 1 tablet (1 mg total) by mouth at bedtime.   clonazePAM 1 MG tablet Commonly known as: KLONOPIN Take by mouth.   Cranberry 400 MG Caps Take by mouth.   Gocovri 137 MG Cp24 Generic drug: Amantadine HCl ER Take 1 capsule by mouth at bedtime.   melatonin 5 MG Tabs Take 1 tablet (5 mg total) by mouth at bedtime as needed.   nitrofurantoin (macrocrystal-monohydrate) 100 MG capsule Commonly known as: MACROBID Take 1 capsule (100 mg total) by mouth every 12 (twelve) hours.   omeprazole 20 MG capsule Commonly known as: PRILOSEC Take 1 capsule by mouth at bedtime.   polyethylene glycol powder 17 GM/SCOOP powder Commonly known as: GLYCOLAX/MIRALAX Take by mouth.   QUEtiapine 25 MG tablet Commonly known as: SEROQUEL TAKE 1 TABLET(25 MG) BY MOUTH EVERY NIGHT   rasagiline 1 MG Tabs tablet Commonly known as: AZILECT Take 1 mg by mouth daily.   tamsulosin 0.4 MG Caps capsule Commonly known as: FLOMAX Take 1 capsule (0.4 mg total) by mouth daily with lunch.        Allergies:  No Known Allergies  Family History: No family history on file.  Social History:  reports that he has never smoked. He has never used smokeless tobacco. He reports that  he does not currently use alcohol. No history on file for drug use.   Physical Exam: BP 112/72   Pulse 68   Ht 5' 7" (1.702 m)   Wt 135 lb (61.2 kg)   BMI 21.14 kg/m   Constitutional:  Well nourished. Alert and oriented, No acute distress. HEENT: [place] AT, moist mucus membranes.  Trachea midline Cardiovascular: No clubbing, cyanosis, or edema. Respiratory: Normal respiratory effort, no increased work of breathing. Neurologic: Grossly intact, no focal deficits, moving all 4 extremities. Psychiatric: Normal mood and affect.   Laboratory Data:  Ref Range & Units 3 wk ago  Glucose 70 - 110 mg/dL 82   Sodium 136 - 145  mmol/L 138   Potassium 3.6 - 5.1 mmol/L 4.7   Chloride 97 - 109 mmol/L 106   Carbon Dioxide (CO2) 22.0 - 32.0 mmol/L 29.4   Urea Nitrogen (BUN) 7 - 25 mg/dL 20   Creatinine 0.7 - 1.3 mg/dL 1.5 High    Glomerular Filtration Rate (eGFR), MDRD Estimate >60 mL/min/1.73sq m 45 Low    Calcium 8.7 - 10.3 mg/dL 9.1   AST  8 - 39 U/L 16   ALT  6 - 57 U/L 5 Low    Alk Phos (alkaline Phosphatase) 34 - 104 U/L 76   Albumin 3.5 - 4.8 g/dL 4.3   Bilirubin, Total 0.3 - 1.2 mg/dL 0.9   Protein, Total 6.1 - 7.9 g/dL 6.7   A/G Ratio 1.0 - 5.0 gm/dL 1.8   Resulting Agency  [facility] - [facility]   Specimen Collected: 04/05/22 09:47   Performed by: [facility] - LAB Last Resulted: 04/05/22 15:51  Received From: [facility]  Result Received: 04/15/22 13:43   Received Information   Ref Range & Units 3 wk ago  WBC (White Blood Cell Count) 4.1 - 10.2 10^3/uL 5.4   RBC (Red Blood Cell Count) 4.69 - 6.13 10^6/uL 4.52 Low    Hemoglobin 14.1 - 18.1 gm/dL 13.5 Low    Hematocrit 40.0 - 52.0 % 41.3   MCV (Mean Corpuscular Volume) 80.0 - 100.0 fl 91.4   MCH (Mean Corpuscular Hemoglobin) 27.0 - 31.2 pg 29.9   MCHC (Mean Corpuscular Hemoglobin Concentration) 32.0 - 36.0 gm/dL 32.7   Platelet Count 150 - 450 10^3/uL 127 Low    RDW-CV (Red Cell Distribution Width) 11.6 - 14.8 % 12.5   MPV (Mean Platelet Volume) 9.4 - 12.4 fl 11.6   Neutrophils 1.50 - 7.80 10^3/uL 3.50   Lymphocytes 1.00 - 3.60 10^3/uL 1.20   Mixed Count 0.10 - 0.90 10^3/uL 0.70   Neutrophil % 32.0 - 70.0 % 65.9   Lymphocyte % 10.0 - 50.0 % 21.3   Mixed % 3.0 - 14.4 % 12.8   Resulting Agency  [facility] - LAB   Specimen Collected: 04/05/22 09:47   Performed by: [facility] - [facility] Last Resulted: 04/05/22 13:40  Received From: [facility]  Result Received: 04/15/22 13:43   Urinalysis >30 WBCs, 3-10 RBCs. I have reviewed the labs.   Assessment & Plan:     1. Suspected  UTI -UA grossly infected -Urine will be sent for culture -Will treat in the interim with nitrofurantoin  -Will further evaluate with RUS to rule out any underlying etiology.   2. Microscopic hematuria -UA w/pyuria and micro heme -urine sent for culture -RUS pending  3. BPH with history of retention -stable  Return in about 1 month (around 05/30/2022) for RUS  report and repeat UA .  [facility] [address], [place], [place] [address] [phone]  I, Kirke Shaggy Littlejohn,acting as a scribe for [facility], PA-C.,have documented all relevant documentation on the behalf of  , PA-C,as directed by  [facility], PA-C while in the presence of [place], PA-C.  I have reviewed the above documentation for accuracy and completeness, and I agree with the above.    Zara Council, PA-C

## 2022-05-02 ENCOUNTER — Other Ambulatory Visit: Payer: Self-pay

## 2022-05-02 ENCOUNTER — Telehealth: Payer: Self-pay

## 2022-05-02 DIAGNOSIS — R3989 Other symptoms and signs involving the genitourinary system: Secondary | ICD-10-CM

## 2022-05-02 DIAGNOSIS — R3129 Other microscopic hematuria: Secondary | ICD-10-CM

## 2022-05-02 NOTE — Telephone Encounter (Signed)
-----   Message from Harle Battiest, PA-C sent at 05/01/2022 11:44 AM EDT ----- I wanted his urine sent for culture and it wasn't sent.  Please apologize to them.  If they feel Joshua George is improving, we can recheck his urine at his return visit.  If he is not improving, we need another specimen.

## 2022-05-02 NOTE — Telephone Encounter (Signed)
Pt's wife states pt did not feel well yesterday 8/20, she is unsure if its because of his Parkinson's or related to something else. They will be dropping off urine sample tomorrow 8/22 to send for culture. Culture orders placed.

## 2022-05-03 ENCOUNTER — Other Ambulatory Visit: Payer: Medicare PPO

## 2022-05-03 DIAGNOSIS — R3129 Other microscopic hematuria: Secondary | ICD-10-CM

## 2022-05-03 DIAGNOSIS — R3989 Other symptoms and signs involving the genitourinary system: Secondary | ICD-10-CM

## 2022-05-06 LAB — CULTURE, URINE COMPREHENSIVE

## 2022-05-09 ENCOUNTER — Telehealth: Payer: Self-pay

## 2022-05-09 NOTE — Telephone Encounter (Signed)
-----   Message from Harle Battiest, PA-C sent at 05/07/2022  4:59 PM EDT ----- Please let the [facility] know that his urine culture was negative.  His RUS has not been scheduled.  Have they heard from scheduling?

## 2022-05-09 NOTE — Telephone Encounter (Signed)
Notified pt's wife as advised, ok per DPR. Gave the number for scheduling and advised wife to go ahead and call. Confirmed pts f/u.

## 2022-05-19 ENCOUNTER — Ambulatory Visit
Admission: RE | Admit: 2022-05-19 | Discharge: 2022-05-19 | Disposition: A | Discharge status: Home / Self Care | Payer: Medicare PPO | Source: Ambulatory Visit | Attending: Urology | Admitting: Urology

## 2022-05-19 DIAGNOSIS — R3129 Other microscopic hematuria: Secondary | ICD-10-CM | POA: Diagnosis present

## 2022-05-19 DIAGNOSIS — R3989 Other symptoms and signs involving the genitourinary system: Secondary | ICD-10-CM | POA: Insufficient documentation

## 2022-05-20 ENCOUNTER — Telehealth: Payer: Self-pay

## 2022-05-20 NOTE — Telephone Encounter (Signed)
Notified pt. Pt states he is tired daily. He also notes he saw a little blood in his urine last night but that has been the only episode. He denies any other urinary symptoms.

## 2022-05-20 NOTE — Telephone Encounter (Signed)
-----   Message from Harle Battiest, PA-C sent at 05/20/2022 10:03 AM EDT ----- Would you let Mr. Gruenewald know that the ultrasound did not show a source for an UTI?  How is he feeling?

## 2022-05-20 NOTE — Telephone Encounter (Signed)
Rescheduled pt to 05/25/22. Pt confirmed.

## 2022-05-24 NOTE — Progress Notes (Unsigned)
05/25/22 2:39 PM   Joshua George 01/11/1941 [id number]  Referring provider:  Kandyce Rud, MD [address] [facility] [place],  [place] [address]  Urological history 1. Urinary retention -incidental finding of retention after presenting to the ED for delirium -1800 mL in the bladder -cysto 2022  Prominent lateral lobe enlargement prostate - Moderate median lobe - Moderate trabeculation   2. BPH with retention -PSA 3.39 in 03/2021 -RUS (2023) prostate volume 62 cc -managed with tamsulosin 0.4 mg daily   Chief Complaint  Patient presents with   Results   Benign Prostatic Hypertrophy   Hematuria   Follow-up    HPI: Joshua George is a 81 y.o.male who returns for RUS report.    RUS (05/2022) 4 cm right simple renal cyst and an 11 mm mass off the midpole.  Enlarged prostate. ~62 cc  She states that last evening, he complained of intense burning with urination and also stating that his lower abdomen was hard.  She also noted a tiny blood clot "the size of a gnat" in his urinal.  Patient denies any modifying or aggravating factors.  Patient denies any gross hematuria, dysuria or suprapubic/flank pain.  Patient denies any fevers, chills, nausea or vomiting.    He receives MiraLAX daily and his coffee and wife states he has a bowel movement daily.  UA grossly infected  PMH: Past Medical History:  Diagnosis Date   BPH (benign prostatic hyperplasia)    Dementia (HCC)    GERD (gastroesophageal reflux disease)    Parkinson's disease (HCC)     Surgical History: Past Surgical History:  Procedure Laterality Date   TONSILLECTOMY      Home Medications:  Allergies as of 05/25/2022   No Known Allergies      Medication List        Accurate as of May 25, 2022  2:39 PM. If you have any questions, ask your nurse or doctor.          acetaminophen 325 MG tablet Commonly known as: TYLENOL Take 325 mg by mouth every 6 (six) hours  as needed for mild pain.   carbidopa-levodopa-entacapone 37.5-150-200 MG tablet Commonly known as: STALEVO Take 1 tablet by mouth 4 (four) times daily.   clonazePAM 1 MG tablet Commonly known as: KLONOPIN Take 1 tablet (1 mg total) by mouth at bedtime.   clonazePAM 1 MG tablet Commonly known as: KLONOPIN Take by mouth.   Cranberry 400 MG Caps Take by mouth.   Gocovri 137 MG Cp24 Generic drug: Amantadine HCl ER Take 1 capsule by mouth at bedtime.   melatonin 5 MG Tabs Take 1 tablet (5 mg total) by mouth at bedtime as needed.   nitrofurantoin (macrocrystal-monohydrate) 100 MG capsule Commonly known as: MACROBID Take 1 capsule (100 mg total) by mouth every 12 (twelve) hours.   omeprazole 20 MG capsule Commonly known as: PRILOSEC Take 1 capsule by mouth at bedtime.   polyethylene glycol powder 17 GM/SCOOP powder Commonly known as: GLYCOLAX/MIRALAX Take by mouth.   QUEtiapine 25 MG tablet Commonly known as: SEROQUEL TAKE 1 TABLET(25 MG) BY MOUTH EVERY NIGHT   rasagiline 1 MG Tabs tablet Commonly known as: AZILECT Take 1 mg by mouth daily.   tamsulosin 0.4 MG Caps capsule Commonly known as: FLOMAX Take 1 capsule (0.4 mg total) by mouth daily with lunch.        Allergies:  No Known Allergies  Family History: No family history on file.  Social History:  reports that he has never smoked. He has never used smokeless tobacco. He reports that he does not currently use alcohol. No history on file for drug use.   Physical Exam: BP 113/70   Pulse 76   Ht 5\' 7"  (1.702 m)   Wt 135 lb (61.2 kg)   BMI 21.14 kg/m   Constitutional:  Well nourished. Alert and oriented, No acute distress. HEENT: [place] AT, moist mucus membranes.  Trachea midline Cardiovascular: No clubbing, cyanosis, or edema. Respiratory: Normal respiratory effort, no increased work of breathing. GU: No CVA tenderness.  No bladder fullness or masses.  Stool is palpable in the left lower  quadrant Neurologic: Grossly intact, no focal deficits, moving all 4 extremities. Psychiatric: Normal mood and affect.   Laboratory Data: Urinalysis >30 WBC's, > 30 RBC's and many bacteria I have reviewed the labs.   Pertinent Imaging CLINICAL DATA:  Microscopic hematuria.  UTI.   EXAM: RENAL / URINARY TRACT ULTRASOUND COMPLETE   COMPARISON:  Renal ultrasound February 2022. CT scan of the abdomen and pelvis October 20, 2020.   FINDINGS: Right Kidney:   Renal measurements: 11.4 x 6.0 x 4.9 cm = volume: 153 mL. Contains a 4.4 cm simple cyst. No follow-up imaging recommended for the cyst. The right kidney contains an 11 mm hypoechoic mass off the mid pole, not visualized on the previous comparison studies. No other abnormalities.   Left Kidney:   Renal measurements: 10.9 x 5.3 x 4.8 cm = volume: 146 mL. Echogenicity within normal limits. No mass or hydronephrosis visualized.   Bladder:   Evaluation is limited due to lack of distention but no filling defects or obvious abnormalities are identified.   Other:   The prostate is enlarged measuring 4.7 x 5.7 x 4.4 cm. The prostate volume is 62 cc.   IMPRESSION: 1. There is an 11 mm hypoechoic mass exophytic off the midpole of the right kidney not visualized on comparison studies. This mass could represent a complex/complicated cyst versus a solid mass. Given the history of hematuria, recommend MRI of the abdomen with and without contrast for complete characterization. If MRI is not pursued, recommend a six-month follow-up ultrasound to ensure stability. 2. Enlarged prostate. 3. No other significant abnormalities.     Electronically Signed   By: October 22, 2020 III M.D.   On: 05/20/2022 09:56 I have independently reviewed the films.  See HPI.    Assessment & Plan:     1. Suspected UTI -UA grossly infected -Urine will be sent for culture -Will treat in the interim with nitrofurantoin  -Renal ultrasound did not  identify any sanctuary sites for UTIs  2. Microscopic hematuria -UA w/pyuria and micro heme -urine sent for culture -cysto (2022) BPH -RUS (2023) 11 mm lesion in right kidney  3. BPH -voiding well -Continue tamsulosin 0.4 mg daily  4. Renal mass -RUS (2023) There is an 11 mm hypoechoic mass exophytic off the midpole of the right kidney not visualized on comparison studies. This mass could represent a complex/complicated cyst versus a solid mass.  Given the history of hematuria, recommend MRI of the abdomen with and without contrast for complete characterization. If MRI is not pursued, recommend a six-month follow-up ultrasound to ensure stability.  5. Constipation -KUB to evaluate for the extent of constipation  Return for Follow-up pending urine culture results and KUB.  10-11-1979   [facility] [address], [address] [place], [place] [phone]

## 2022-05-25 ENCOUNTER — Ambulatory Visit: Payer: Medicare PPO | Admitting: Urology

## 2022-05-25 ENCOUNTER — Ambulatory Visit
Admission: RE | Admit: 2022-05-25 | Discharge: 2022-05-25 | Disposition: A | Discharge status: Home / Self Care | Payer: Medicare PPO | Attending: Urology | Admitting: Urology

## 2022-05-25 ENCOUNTER — Encounter: Payer: Self-pay | Admitting: Urology

## 2022-05-25 ENCOUNTER — Ambulatory Visit
Admission: RE | Admit: 2022-05-25 | Discharge: 2022-05-25 | Disposition: A | Discharge status: Home / Self Care | Payer: Medicare PPO | Source: Ambulatory Visit | Attending: Urology | Admitting: Urology

## 2022-05-25 VITALS — BP 113/70 | HR 76 | Ht 67.0 in | Wt 135.0 lb

## 2022-05-25 DIAGNOSIS — R3129 Other microscopic hematuria: Secondary | ICD-10-CM | POA: Diagnosis not present

## 2022-05-25 DIAGNOSIS — R3989 Other symptoms and signs involving the genitourinary system: Secondary | ICD-10-CM

## 2022-05-25 DIAGNOSIS — K5909 Other constipation: Secondary | ICD-10-CM | POA: Insufficient documentation

## 2022-05-25 DIAGNOSIS — R39198 Other difficulties with micturition: Secondary | ICD-10-CM | POA: Diagnosis not present

## 2022-05-25 DIAGNOSIS — N2889 Other specified disorders of kidney and ureter: Secondary | ICD-10-CM

## 2022-05-25 DIAGNOSIS — N401 Enlarged prostate with lower urinary tract symptoms: Secondary | ICD-10-CM

## 2022-05-25 DIAGNOSIS — N138 Other obstructive and reflux uropathy: Secondary | ICD-10-CM

## 2022-05-25 LAB — URINALYSIS, COMPLETE

## 2022-05-25 LAB — MICROSCOPIC EXAMINATION
RBC, Urine: >30 /hpf — AB (ref 0–2)
WBC, UA: >30 /hpf — AB (ref 0–5)

## 2022-05-25 MED ORDER — NITROFURANTOIN MONOHYD MACRO 100 MG PO CAPS: 100.0000 mg | ORAL_CAPSULE | Freq: Two times a day (BID) | ORAL | 0 refills | Status: DC

## 2022-05-27 ENCOUNTER — Telehealth: Payer: Self-pay

## 2022-05-27 NOTE — Telephone Encounter (Signed)
-----   Message from Harle Battiest, PA-C sent at 05/27/2022 10:06 AM EDT ----- Please let the [facility] know that his Xray shows he has a very large stool burden, so he is constipated.  I would ask their PCP what else he can take for the constipation.

## 2022-05-27 NOTE — Telephone Encounter (Signed)
Notified pt as advised, he expressed understanding.

## 2022-05-31 ENCOUNTER — Telehealth: Payer: Self-pay

## 2022-05-31 LAB — CULTURE, URINE COMPREHENSIVE

## 2022-05-31 NOTE — Telephone Encounter (Signed)
-----   Message from Joshua Riis, PA-C sent at 05/31/2022  2:23 PM EDT ----- Please let Joshua George know that his urine culture was positive and that the macrobid was the correct antibiotic.  We will need to recheck his urine in 1 month to ensure the microscopic hematuria has cleared with the treatment of the infection.

## 2022-05-31 NOTE — Telephone Encounter (Signed)
Notified pt as advised. Booked pt for 1 mo repeat UA. Pt would like to know if anything will be done about the spot on his kidney.

## 2022-06-01 ENCOUNTER — Ambulatory Visit: Payer: Medicare PPO | Admitting: Urology

## 2022-06-01 NOTE — Telephone Encounter (Signed)
Ok per [employer], pt's wife notified.

## 2022-06-16 ENCOUNTER — Other Ambulatory Visit: Payer: Self-pay

## 2022-06-16 DIAGNOSIS — R3129 Other microscopic hematuria: Secondary | ICD-10-CM

## 2022-06-22 ENCOUNTER — Other Ambulatory Visit: Payer: Self-pay

## 2022-06-22 ENCOUNTER — Other Ambulatory Visit: Payer: Medicare PPO

## 2022-06-22 DIAGNOSIS — R3129 Other microscopic hematuria: Secondary | ICD-10-CM

## 2022-06-22 DIAGNOSIS — R3989 Other symptoms and signs involving the genitourinary system: Secondary | ICD-10-CM

## 2022-06-22 LAB — URINALYSIS, COMPLETE
Bilirubin, UA: NEGATIVE
Glucose, UA: NEGATIVE
Nitrite, UA: NEGATIVE
Specific Gravity, UA: 1.03 (ref 1.005–1.030)
Urobilinogen, Ur: 0.2 mg/dL (ref 0.2–1.0)
pH, UA: 5 (ref 5.0–7.5)

## 2022-06-22 LAB — MICROSCOPIC EXAMINATION: WBC, UA: >30 /hpf — AB (ref 0–5)

## 2022-06-26 LAB — CULTURE, URINE COMPREHENSIVE

## 2022-06-27 ENCOUNTER — Telehealth: Payer: Self-pay

## 2022-06-27 MED ORDER — SULFAMETHOXAZOLE-TRIMETHOPRIM 800-160 MG PO TABS: 1.0000 | ORAL_TABLET | Freq: Two times a day (BID) | ORAL | 0 refills | Status: AC

## 2022-06-27 NOTE — Telephone Encounter (Signed)
-----   Message from Nori Riis, PA-C sent at 06/26/2022  6:37 PM EDT ----- Please let [facility] know that his urine culture was positive for infection.  I would like for him to start Septra DS twice daily for seven days.

## 2022-06-27 NOTE — Telephone Encounter (Signed)
Notified pts wife (ok per DPR) of message, sent pt's Abx Rx to pharmacy. She verbalized understanding.

## 2022-06-28 ENCOUNTER — Other Ambulatory Visit: Payer: Medicare PPO

## 2022-07-18 NOTE — Progress Notes (Unsigned)
07/19/22 11:51 AM   Joshua George June 17, 1941 [id number]  Referring provider:  Derinda Late, MD [address] [facility] [employer],  [place] [address]  Urological history 1. Urinary retention -incidental finding of retention after presenting to the ED for delirium -1800 mL in the bladder -cysto 2022  Prominent lateral lobe enlargement prostate - Moderate median lobe - Moderate trabeculation   2. BPH with retention -PSA 3.39 in 03/2021 -RUS (2023) prostate volume 62 cc -managed with tamsulosin 0.4 mg daily  3. Renal mass -RUS (05/2022) - 11 mm hypoechoic mass exophytic off the midpole of the right kidney not visualized on comparison studies. This mass could represent a complex/complicated cyst versus a solid mass.  Given the history of hematuria, recommend MRI of the abdomen with and without contrast for complete characterization. If MRI is not pursued, recommend a six-month follow-up ultrasound to ensure stability -RUS in 6 months   Chief Complaint  Patient presents with   Benign Prostatic Hypertrophy    HPI: Joshua George is a 81 y.o.male who returns for a 6 month follow up for PVR.    She states that he is doing well and he has no complaints at today's visit.  Patient denies any modifying or aggravating factors.  Patient denies any gross hematuria, dysuria or suprapubic/flank pain.  Patient denies any fevers, chills, nausea or vomiting.    He is having episodes of nighttime incontinence and nocturia, but it is not associated with any UTI symptoms at this time.  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 If you have any questions, ask your nurse or doctor.          STOP taking these medications    nitrofurantoin (macrocrystal-monohydrate) 100 MG capsule Commonly known as: MACROBID       TAKE these medications    acetaminophen 325 MG tablet Commonly known as: TYLENOL Take 325 mg by mouth every 6 (six) hours as needed for mild pain.   carbidopa-levodopa-entacapone 37.5-150-200 MG tablet Commonly known as: STALEVO Take 1 tablet by mouth 4 (four) times daily.   clonazePAM 1 MG tablet Commonly known as: KLONOPIN Take 1 tablet (1 mg total) by mouth at bedtime.   clonazePAM 1 MG tablet Commonly known as: KLONOPIN Take by mouth.   Cranberry 400 MG Caps Take by mouth.   Gocovri 137 MG Cp24 Generic drug: Amantadine HCl ER Take 1 capsule by mouth at bedtime.   melatonin 5 MG Tabs Take 1 tablet (5 mg total) by mouth at bedtime as needed.   omeprazole 20 MG capsule Commonly known as: PRILOSEC Take 1 capsule by mouth at bedtime.   polyethylene glycol powder 17 GM/SCOOP powder Commonly known as: GLYCOLAX/MIRALAX Take by mouth.   QUEtiapine 25 MG tablet Commonly known as: SEROQUEL TAKE 1 TABLET(25 MG) BY MOUTH EVERY NIGHT   rasagiline 1 MG Tabs tablet Commonly known as: AZILECT Take 1 mg by mouth daily.   tamsulosin 0.4 MG Caps capsule Commonly known as: FLOMAX  Take 1 capsule (0.4 mg total) by mouth daily with lunch.        Allergies:  No Known Allergies  Family History: No family history on file.  Social History:  reports that he has never smoked. He has never used smokeless tobacco. He reports that he does not currently use alcohol. No history on file for drug use.   Physical Exam: BP (!) 145/77   Pulse 61   Ht 5\' 8"  (1.727 m)   Wt 135 lb (61.2 kg)   BMI 20.53 kg/m   Constitutional:  Well nourished. Alert and oriented, No acute distress. HEENT: [place] AT, moist mucus membranes.  Trachea  midline Cardiovascular: No clubbing, cyanosis, or edema. Respiratory: Normal respiratory effort, no increased work of breathing. Neurologic: Grossly intact, no focal deficits, moving all 4 extremities. Psychiatric: Normal mood and affect.   Laboratory Data: N/A  Pertinent Imaging  07/19/22 11:48  Scan Result 7    Assessment & Plan:     1. BPH -voiding well -Continue tamsulosin 0.4 mg daily  2. Renal mass -repeat RUS 11/2021   Return in about 4 months (around 11/17/2022) for Repeat renal ultrasound and PVR.  01/17/2023   [facility] [address] [place], [place] [phone]

## 2022-07-19 ENCOUNTER — Ambulatory Visit: Payer: Medicare PPO | Admitting: Urology

## 2022-07-19 ENCOUNTER — Encounter: Payer: Self-pay | Admitting: Urology

## 2022-07-19 VITALS — BP 145/77 | HR 61 | Ht 68.0 in | Wt 135.0 lb

## 2022-07-19 DIAGNOSIS — N2889 Other specified disorders of kidney and ureter: Secondary | ICD-10-CM | POA: Diagnosis not present

## 2022-07-19 DIAGNOSIS — N401 Enlarged prostate with lower urinary tract symptoms: Secondary | ICD-10-CM | POA: Diagnosis not present

## 2022-07-19 DIAGNOSIS — N138 Other obstructive and reflux uropathy: Secondary | ICD-10-CM | POA: Diagnosis not present

## 2022-07-19 LAB — BLADDER SCAN AMB NON-IMAGING: Scan Result: 7

## 2022-07-19 MED ORDER — TAMSULOSIN HCL 0.4 MG PO CAPS: 0.4000 mg | ORAL_CAPSULE | Freq: Every day | ORAL | 3 refills | Status: DC

## 2022-08-08 ENCOUNTER — Other Ambulatory Visit: Payer: Self-pay | Admitting: Urology

## 2022-08-08 DIAGNOSIS — N401 Enlarged prostate with lower urinary tract symptoms: Secondary | ICD-10-CM

## 2022-08-25 ENCOUNTER — Encounter: Payer: Self-pay | Admitting: Urology

## 2022-08-25 ENCOUNTER — Ambulatory Visit: Payer: Medicare PPO | Admitting: Urology

## 2022-08-25 VITALS — BP 105/67 | HR 66 | Ht 70.0 in | Wt 135.0 lb

## 2022-08-25 DIAGNOSIS — R319 Hematuria, unspecified: Secondary | ICD-10-CM | POA: Diagnosis not present

## 2022-08-25 DIAGNOSIS — N2889 Other specified disorders of kidney and ureter: Secondary | ICD-10-CM

## 2022-08-25 DIAGNOSIS — N138 Other obstructive and reflux uropathy: Secondary | ICD-10-CM

## 2022-08-25 DIAGNOSIS — N401 Enlarged prostate with lower urinary tract symptoms: Secondary | ICD-10-CM

## 2022-08-25 DIAGNOSIS — R8281 Pyuria: Secondary | ICD-10-CM

## 2022-08-25 DIAGNOSIS — R3989 Other symptoms and signs involving the genitourinary system: Secondary | ICD-10-CM

## 2022-08-25 DIAGNOSIS — R8271 Bacteriuria: Secondary | ICD-10-CM

## 2022-08-25 LAB — URINALYSIS, COMPLETE
Bilirubin, UA: NEGATIVE
Nitrite, UA: NEGATIVE
Specific Gravity, UA: 1.03 (ref 1.005–1.030)
Urobilinogen, Ur: 0.2 mg/dL (ref 0.2–1.0)
pH, UA: 5 (ref 5.0–7.5)

## 2022-08-25 LAB — MICROSCOPIC EXAMINATION: WBC, UA: >30 /hpf — AB (ref 0–5)

## 2022-08-25 LAB — BLADDER SCAN AMB NON-IMAGING: Scan Result: 0

## 2022-08-25 MED ORDER — SULFAMETHOXAZOLE-TRIMETHOPRIM 800-160 MG PO TABS: 1.0000 | ORAL_TABLET | Freq: Two times a day (BID) | ORAL | 0 refills | Status: DC

## 2022-08-25 MED ORDER — SULFAMETHOXAZOLE-TRIMETHOPRIM 800-160 MG PO TABS
1.0000 | ORAL_TABLET | Freq: Two times a day (BID) | ORAL | Status: DC
  Administered 2022-08-25: 1 via ORAL

## 2022-08-25 NOTE — Progress Notes (Signed)
08/25/22 12:06 PM   Joshua George October 21, 1940 [id number]  Referring provider:  Kandyce Rud, MD [address] [facility] [place],  [place] [address]  Urological history 1. Urinary retention -incidental finding of retention after presenting to the ED for delirium -1800 mL in the bladder -cysto 2022  Prominent lateral lobe enlargement prostate - Moderate median lobe - Moderate trabeculation   2. BPH with retention -PSA 3.39 in 03/2021 -RUS (2023) prostate volume 62 cc -managed with tamsulosin 0.4 mg daily  3. Renal mass -RUS (05/2022) - 11 mm hypoechoic mass exophytic off the midpole of the right kidney not visualized on comparison studies. This mass could represent a complex/complicated cyst versus a solid mass.  Given the history of hematuria, recommend MRI of the abdomen with and without contrast for complete characterization. If MRI is not pursued, recommend a six-month follow-up ultrasound to ensure stability -RUS in 6 months   Chief Complaint  Patient presents with   Urinary Tract Infection    HPI: Joshua George is a 81 y.o.male who returns for possible UTI.    "We have had a rough couple of nights."  He has been having hallucinations and being delusional.   He is also having an increase in nocturia and night time incontinence.  Patient denies any modifying or aggravating factors.  Patient denies any gross hematuria, dysuria or suprapubic/flank pain.  Patient denies any fevers, chills, nausea or vomiting.    UA orange cloudy, glucose trace, ketones trace, specific gravity 1.030, trace blood, pH 5.0, 1+ protein, 1+ leukocyte, greater than 30 WBCs, 3-10 RBCs, 0-10 epithelial cells and moderate bacteria.  PVR 0 mL   PMH: Past Medical History:  Diagnosis Date   BPH (benign prostatic hyperplasia)    Dementia (HCC)    GERD (gastroesophageal reflux disease)    Parkinson's disease     Surgical History: Past Surgical History:   Procedure Laterality Date   TONSILLECTOMY      Home Medications:  Allergies as of 08/25/2022   No Known Allergies      Medication List        Accurate as of August 25, 2022 12:06 PM. If you have any questions, ask your nurse or doctor.          acetaminophen 325 MG tablet Commonly known as: TYLENOL Take 325 mg by mouth every 6 (six) hours as needed for mild pain.   carbidopa-levodopa-entacapone 37.5-150-200 MG tablet Commonly known as: STALEVO Take 1 tablet by mouth 4 (four) times daily.   clonazePAM 1 MG tablet Commonly known as: KLONOPIN Take 1 tablet (1 mg total) by mouth at bedtime.   clonazePAM 1 MG tablet Commonly known as: KLONOPIN Take by mouth.   Cranberry 400 MG Caps Take by mouth.   Gocovri 137 MG Cp24 Generic drug: Amantadine HCl ER Take 1 capsule by mouth at bedtime.   melatonin 5 MG Tabs Take 1 tablet (5 mg total) by mouth at bedtime as needed.   omeprazole 20 MG capsule Commonly known as: PRILOSEC Take 1 capsule by mouth at bedtime.   polyethylene glycol powder 17 GM/SCOOP powder Commonly known as: GLYCOLAX/MIRALAX Take by mouth.   QUEtiapine 25 MG tablet Commonly known as: SEROQUEL TAKE 1 TABLET(25 MG) BY MOUTH EVERY NIGHT   rasagiline 1 MG Tabs tablet Commonly known as: AZILECT Take 1 mg by mouth daily.   sulfamethoxazole-trimethoprim 800-160 MG tablet Commonly known as: BACTRIM DS Take 1 tablet by mouth every 12 (twelve) hours.  Started by: Michiel Cowboy, PA-C   tamsulosin 0.4 MG Caps capsule Commonly known as: FLOMAX TAKE 1 CAPSULE(0.4 MG) BY MOUTH DAILY WITH LUNCH        Allergies:  No Known Allergies  Family History: No family history on file.  Social History:  reports that he has never smoked. He has never used smokeless tobacco. He reports that he does not currently use alcohol. No history on file for drug use.   Physical Exam: BP 105/67   Pulse 66   Ht 5\' 10"  (1.778 m)   Wt 135 lb (61.2 kg)   BMI  19.37 kg/m   Constitutional:  Well nourished. Alert and oriented, No acute distress. HEENT: [place] AT, moist mucus membranes.  Trachea midline Cardiovascular: No clubbing, cyanosis, or edema. Respiratory: Normal respiratory effort, no increased work of breathing. Neurologic: Grossly intact, no focal deficits, moving all 4 extremities. Psychiatric: Normal mood and affect.   Laboratory Data: N/A  Pertinent Imaging   08/25/22 11:47  Scan Result 0   Assessment/plan:  1. Suspected UTI -UA grossly infected with pyuria, hematuria and bacteriuria -Urine sent for culture -He started on Septra DS twice daily for 7 days, 1 tablet is given here in the office  2. BPH -voiding well -Continue tamsulosin 0.4 mg daily  3. Renal mass -repeat RUS 11/2021   Return for Pending urine culture results.  12/2021   [facility] [address] [place], [place] [phone]

## 2022-08-26 ENCOUNTER — Ambulatory Visit: Payer: Medicare PPO | Admitting: Urology

## 2022-08-31 ENCOUNTER — Telehealth: Payer: Self-pay | Admitting: Family Medicine

## 2022-08-31 LAB — CULTURE, URINE COMPREHENSIVE

## 2022-08-31 NOTE — Telephone Encounter (Signed)
-----   Message from Harle Battiest, PA-C sent at 08/31/2022 12:17 PM EST ----- Please let the Capp's know that the urine culture was positive for infection at the Septra that I prescribed is the appropriate antibiotic.  If his symptoms do not improve, would like to see him back in clinic.  If he is feeling well then we will see him in March as scheduled

## 2022-08-31 NOTE — Telephone Encounter (Signed)
Patient notified and voiced understanding.

## 2022-11-21 ENCOUNTER — Other Ambulatory Visit: Payer: Self-pay

## 2022-11-21 DIAGNOSIS — R3129 Other microscopic hematuria: Secondary | ICD-10-CM

## 2022-11-29 ENCOUNTER — Ambulatory Visit
Admission: RE | Admit: 2022-11-29 | Discharge: 2022-11-29 | Disposition: A | Discharge status: Home / Self Care | Payer: Medicare PPO | Source: Ambulatory Visit | Attending: Urology | Admitting: Urology

## 2022-11-29 DIAGNOSIS — R3129 Other microscopic hematuria: Secondary | ICD-10-CM | POA: Diagnosis not present

## 2022-11-29 NOTE — Progress Notes (Unsigned)
12/01/22 11:26 AM   Joshua George August 31, 1941 [id number]  Referring provider:  Derinda Late, MD [address] [place] [facility] [place],  [place] [address]  Urological history 1. Urinary retention -incidental finding of retention after presenting to the ED for delirium -1800 mL in the bladder -cysto 2022  Prominent lateral lobe enlargement prostate - Moderate median lobe - Moderate trabeculation   2. BPH with retention -PSA 3.39 in 03/2021 -RUS (2024) prostate volume 76 cc -tamsulosin 0.4 mg daily  3. Renal mass -RUS (05/2022) - 11 mm hypoechoic mass exophytic off the midpole of the right kidney not visualized on comparison studies. This mass could represent a complex/complicated cyst versus a solid mass.  Given the history of hematuria, recommend MRI of the abdomen with and without contrast for complete characterization. If MRI is not pursued, recommend a six-month follow-up ultrasound to ensure stability -RUS (11/2022) - The bladder is poorly distended limiting evaluation. Possible mild wall thickening measuring up to 5 mm.  The prostate demonstrates a 76 cc volume which is enlarged. The kidneys are unremarkable.   Chief Complaint  Patient presents with   Other    PVR    HPI: Joshua George is a 82 y.o.male who returns for six month follow up with his wife, Joshua George.   RUS (11/2022) - benign cysts   They both feel that he is doing well.  He has not had any signs of urinary tract infection.  He has some episodes of urinary incontinence, but they are manageable.  Patient denies any modifying or aggravating factors.  Patient denies any gross hematuria, dysuria or suprapubic/flank pain.  Patient denies any fevers, chills, nausea or vomiting.    PVR 51 mL   PMH: Past Medical History:  Diagnosis Date   BPH (benign prostatic hyperplasia)    Dementia (HCC)    GERD (gastroesophageal reflux disease)    Parkinson's disease     Surgical History: Past  Surgical History:  Procedure Laterality Date   TONSILLECTOMY      Home Medications:  Allergies as of 12/01/2022   No Known Allergies      Medication List        Accurate as of December 01, 2022 11:26 AM. If you have any questions, ask your nurse or doctor.          acetaminophen 325 MG tablet Commonly known as: TYLENOL Take 325 mg by mouth every 6 (six) hours as needed for mild pain.   carbidopa-levodopa-entacapone 37.5-150-200 MG tablet Commonly known as: STALEVO Take 1 tablet by mouth 4 (four) times daily.   clonazePAM 1 MG tablet Commonly known as: KLONOPIN Take by mouth. What changed: Another medication with the same name was removed. Continue taking this medication, and follow the directions you see here. Changed by: Zara Council, PA-C   Cranberry 400 MG Caps Take by mouth.   Gocovri 137 MG Cp24 Generic drug: Amantadine HCl ER Take 1 capsule by mouth at bedtime.   melatonin 5 MG Tabs Take 1 tablet (5 mg total) by mouth at bedtime as needed.   omeprazole 20 MG capsule Commonly known as: PRILOSEC Take 1 capsule by mouth at bedtime.   polyethylene glycol powder 17 GM/SCOOP powder Commonly known as: GLYCOLAX/MIRALAX Take by mouth.   QUEtiapine 25 MG tablet Commonly known as: SEROQUEL TAKE 1 TABLET(25 MG) BY MOUTH EVERY NIGHT   rasagiline 1 MG Tabs tablet Commonly known as: AZILECT Take 1 mg by mouth daily.   sulfamethoxazole-trimethoprim  800-160 MG tablet Commonly known as: BACTRIM DS Take 1 tablet by mouth every 12 (twelve) hours.   tamsulosin 0.4 MG Caps capsule Commonly known as: FLOMAX TAKE 1 CAPSULE(0.4 MG) BY MOUTH DAILY WITH LUNCH        Allergies:  No Known Allergies  Family History: No family history on file.  Social History:  reports that he has never smoked. He has never used smokeless tobacco. He reports that he does not currently use alcohol. No history on file for drug use.   Physical Exam: BP 115/70   Pulse (!) 54    Ht 5\' 6"  (1.676 m)   Wt 139 lb 12.8 oz (63.4 kg)   BMI 22.56 kg/m   Constitutional:  Well nourished. Alert and oriented, No acute distress. HEENT: [place] AT, moist mucus membranes.  Trachea midline Cardiovascular: No clubbing, cyanosis, or edema. Respiratory: Normal respiratory effort, no increased work of breathing. Neurologic: Grossly intact, no focal deficits, moving all 4 extremities. Psychiatric: Normal mood and affect.   Laboratory Data: Serum creatinine (09/2022) 1.1 I have reviewed the labs.   Pertinent Imaging  Narrative & Impression CLINICAL DATA:  Microscopic hematuria   EXAM: RENAL / URINARY TRACT ULTRASOUND COMPLETE   COMPARISON:  None Available.   FINDINGS: Right Kidney:   Renal measurements: 11.0 x 5.8 x 5.1 cm = volume: 169.1 mL. Contains cysts with the largest measuring 4.5 cm. No follow-up imaging recommended for the cysts.   Left Kidney:   Renal measurements: 11.6 x 4.3 x 5.4 cm = volume: 141 mL. Echogenicity within normal limits. No mass or hydronephrosis visualized.   Bladder:   Lack of distension limiting evaluation. Possible mild wall thickening measuring up to 5 mm.   Other:   Prostate demonstrates a 76 cc volume.   IMPRESSION: 1. The bladder is poorly distended limiting evaluation. Possible mild wall thickening measuring up to 5 mm. 2. The prostate demonstrates a 76 cc volume which is enlarged. 3. The kidneys are unremarkable.     Electronically Signed   By: Dorise Bullion III M.D.   On: 11/29/2022 17:33    12/01/22 11:10  Scan Result 51ML  I have independently reviewed the films.  See HPI.     Assessment/plan:  1. BPH -voiding well -Continue tamsulosin 0.4 mg daily  2. Renal mass -repeat RUS 11/2022 - benign cysts    Return in about 6 months (around 06/03/2023) for PVR .  Joshua George, [place] [address] [place], [place] [address] [phone]

## 2022-12-01 ENCOUNTER — Ambulatory Visit: Payer: Medicare PPO | Admitting: Urology

## 2022-12-01 ENCOUNTER — Encounter: Payer: Self-pay | Admitting: Urology

## 2022-12-01 VITALS — BP 115/70 | HR 54 | Ht 66.0 in | Wt 139.8 lb

## 2022-12-01 DIAGNOSIS — N2889 Other specified disorders of kidney and ureter: Secondary | ICD-10-CM

## 2022-12-01 DIAGNOSIS — N401 Enlarged prostate with lower urinary tract symptoms: Secondary | ICD-10-CM | POA: Diagnosis not present

## 2022-12-01 DIAGNOSIS — N138 Other obstructive and reflux uropathy: Secondary | ICD-10-CM

## 2022-12-01 LAB — BLADDER SCAN AMB NON-IMAGING

## 2023-03-07 ENCOUNTER — Ambulatory Visit: Payer: Medicare PPO | Admitting: Physician Assistant

## 2023-03-07 ENCOUNTER — Encounter: Payer: Self-pay | Admitting: Physician Assistant

## 2023-03-07 VITALS — BP 91/52 | HR 66 | Ht 68.0 in | Wt 130.5 lb

## 2023-03-07 DIAGNOSIS — R8281 Pyuria: Secondary | ICD-10-CM | POA: Diagnosis not present

## 2023-03-07 LAB — MICROSCOPIC EXAMINATION: WBC, UA: >30 /hpf — AB (ref 0–5)

## 2023-03-07 LAB — URINALYSIS, COMPLETE

## 2023-03-07 LAB — BLADDER SCAN AMB NON-IMAGING

## 2023-03-07 MED ORDER — SULFAMETHOXAZOLE-TRIMETHOPRIM 800-160 MG PO TABS: 1.0000 | ORAL_TABLET | Freq: Two times a day (BID) | ORAL | 0 refills | Status: DC

## 2023-03-07 NOTE — Progress Notes (Signed)
03/07/2023 3:32 PM   Malachi Cammack March 03, 1941 [id number]  CC: Chief Complaint  Patient presents with   Follow-up   HPI: Joshua George is a 82 y.o. male with PMH BPH with urinary retention in 2022 on Flomax and Parkinson's disease who presents today for evaluation of possible UTI.  He is accompanied today by his wife, who contributes to HPI.  Today he reports several days of increased hallucinations and feeling "not like himself."  He admits to some dehydration.  He has some occasional dysuria, but denies fever, chills, nausea, vomiting, and gross hematuria.  They have been taking Azo daily for quite some time.  Renal ultrasound dated 11/29/2022 with no evidence of hydronephrosis or shadowing stones.  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  In-office UA today positive for orange color, unable to read due to pigment interference; urine microscopy with >30 WBCs/HPF and many bacteria. PVR 3mL.  PMH: Past Medical History:  Diagnosis Date   BPH (benign prostatic hyperplasia)    Dementia (HCC)    GERD (gastroesophageal reflux disease)    Parkinson's disease     Surgical History: Past Surgical History:  Procedure Laterality Date   TONSILLECTOMY      Home Medications:  Allergies as of 03/07/2023   No Known Allergies      Medication List        Accurate as of March 07, 2023  3:32 PM. If you have any questions, ask your nurse or doctor.          acetaminophen 325 MG tablet Commonly known as: TYLENOL Take 325 mg by mouth every 6 (six) hours as needed for mild pain.   carbidopa-levodopa-entacapone 37.5-150-200 MG tablet Commonly known as: STALEVO Take 1 tablet by mouth 4 (four) times daily.   clonazePAM 1 MG tablet Commonly known as: KLONOPIN Take by mouth.   Cranberry 400 MG Caps Take by mouth.   Gocovri 137 MG Cp24 Generic drug: Amantadine HCl ER Take 1 capsule by mouth at bedtime.   melatonin 5 MG Tabs Take 1 tablet (5 mg total)  by mouth at bedtime as needed.   omeprazole 20 MG capsule Commonly known as: PRILOSEC Take 1 capsule by mouth at bedtime.   polyethylene glycol powder 17 GM/SCOOP powder Commonly known as: GLYCOLAX/MIRALAX Take by mouth.   QUEtiapine 25 MG tablet Commonly known as: SEROQUEL TAKE 1 TABLET(25 MG) BY MOUTH EVERY NIGHT   rasagiline 1 MG Tabs tablet Commonly known as: AZILECT Take 1 mg by mouth daily.   sulfamethoxazole-trimethoprim 800-160 MG tablet Commonly known as: BACTRIM DS Take 1 tablet by mouth every 12 (twelve) hours.   tamsulosin 0.4 MG Caps capsule Commonly known as: FLOMAX TAKE 1 CAPSULE(0.4 MG) BY MOUTH DAILY WITH LUNCH        Allergies:  No Known Allergies  Family History: No family history on file.  Social History:   reports that he has never smoked. He has never used smokeless tobacco. He reports that he does not currently use alcohol. No history on file for drug use.  Physical Exam: BP (!) 91/52   Pulse 66   Ht 5\' 8"  (1.727 m)   Wt 130 lb 8 oz (59.2 kg)   BMI 19.84 kg/m   Constitutional:  Alert, no acute distress, nontoxic appearing HEENT: [place], AT Cardiovascular: No clubbing, cyanosis, or edema Respiratory: Normal respiratory effort, no increased work of breathing Skin: No rashes, bruises or suspicious lesions Neurologic: Grossly intact, no focal deficits, moving  all 4 extremities Psychiatric: Normal mood and affect  Laboratory Data: Results for orders placed or performed in visit on 03/07/23  Microscopic Examination   Urine  Result Value Ref Range   WBC, UA >30 (A) 0 - 5 /hpf   RBC, Urine 0-2 0 - 2 /hpf   Epithelial Cells (non renal) 0-10 0 - 10 /hpf   Bacteria, UA Many (A) None seen/Few  Urinalysis, Complete  Result Value Ref Range   Specific Gravity, UA CANCELED    pH, UA CANCELED    Color, UA Orange Yellow   Appearance Ur Cloudy (A) Clear   Protein,UA CANCELED    Glucose, UA CANCELED    Ketones, UA CANCELED    Microscopic  Examination See below:   BLADDER SCAN AMB NON-IMAGING  Result Value Ref Range   Scan Result 3ml    Assessment & Plan:   1. Pyuria UA today is notable for pyuria and bacteriuria, not atypical for him.  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  Will start empiric Bactrim and send for culture for further evaluation.  His urine appears rather concentrated and his BP is soft today.  I encouraged him to push fluids as I suspect he is dehydrated.  Low suspicion for sepsis given that he is afebrile and heart rate is WNL.  We discussed stopping Azo and only taking it as needed for dysuria due to the risk for hemolytic anemia with long-term use.  I encouraged them to follow-up with Dr. Sherryll Burger if his symptoms do not improve with increased p.o. hydration and antibiotics. - Urinalysis, Complete - BLADDER SCAN AMB NON-IMAGING - CULTURE, URINE COMPREHENSIVE - sulfamethoxazole-trimethoprim (BACTRIM DS) 800-160 MG tablet; Take 1 tablet by mouth every 12 (twelve) hours.  Dispense: 14 tablet; Refill: 0   Return if symptoms worsen or fail to improve.  Carman Ching, PA-C  [facility] [place] [address] [place], [place] [address] [phone]

## 2023-03-16 LAB — CULTURE, URINE COMPREHENSIVE

## 2023-03-24 ENCOUNTER — Encounter: Payer: Self-pay | Admitting: Physician Assistant

## 2023-03-24 ENCOUNTER — Ambulatory Visit: Payer: Medicare PPO | Admitting: Physician Assistant

## 2023-03-24 VITALS — BP 105/55 | HR 69

## 2023-03-24 DIAGNOSIS — R310 Gross hematuria: Secondary | ICD-10-CM

## 2023-03-24 LAB — URINALYSIS, COMPLETE
Bilirubin, UA: NEGATIVE
Nitrite, UA: NEGATIVE
Specific Gravity, UA: 1.025 (ref 1.005–1.030)
Urobilinogen, Ur: 1 mg/dL (ref 0.2–1.0)
pH, UA: 5.5 (ref 5.0–7.5)

## 2023-03-24 LAB — MICROSCOPIC EXAMINATION: WBC, UA: >30 /hpf — AB (ref 0–5)

## 2023-03-24 MED ORDER — NITROFURANTOIN MONOHYD MACRO 100 MG PO CAPS: 100.0000 mg | ORAL_CAPSULE | Freq: Two times a day (BID) | ORAL | 0 refills | Status: AC

## 2023-03-24 NOTE — Progress Notes (Signed)
03/24/2023 1:34 PM   Arby Cali 1941/03/31 [id number]  CC: Chief Complaint  Patient presents with   Hematuria   HPI: Joshua George is a 82 y.o. male with PMH BPH with urinary retention in 2022 on Flomax, MRSA urinary colonization, and Parkinson's disease who presents today for evaluation of gross hematuria.  He is accompanied today by his wife, who contributes to HPI.  I saw him in clinic most recently on 03/07/2023 for evaluation of possible UTI.  UA appeared grossly infected, consistent with his baseline, and I started him on empiric Bactrim DS twice daily x 7 days.  As predicted, urine culture grew MRSA.  Today they report that he finished Bactrim 10 days ago.  2 days later, he developed gross hematuria with passage of small clots.  His gross hematuria has resolved, but he continues to pass small clot fragments.  He reports dysuria and penile tenderness with voiding.  They deny fever, chills, nausea, and vomiting.  He is a never smoker.  Renal ultrasound dated 11/29/2022 with no significant findings.  He reports intermittent gross hematuria of unknown duration, however his wife has not noticed this.  She points out that he does have hallucinations due to his Parkinson's disease, so unclear if this history is reliable.  Notably, he underwent cystoscopy with Dr. Lonna Cobb on 02/06/2021 with notable findings including prominent lateral lobe enlargement with moderate median lobe.  In-office UA today positive for trace glucose, 1+ ketones, 3+ blood, 3+ protein, and 3+ leukocytes; urine microscopy with >30 WBCs/HPF, 3-10 RBCs/HPF, and few bacteria.   PMH: Past Medical History:  Diagnosis Date   BPH (benign prostatic hyperplasia)    Dementia (HCC)    GERD (gastroesophageal reflux disease)    Parkinson's disease     Surgical History: Past Surgical History:  Procedure Laterality Date   TONSILLECTOMY      Home Medications:  Allergies as of 03/24/2023   No Known Allergies       Medication List        Accurate as of March 24, 2023  1:34 PM. If you have any questions, ask your nurse or doctor.          acetaminophen 325 MG tablet Commonly known as: TYLENOL Take 325 mg by mouth every 6 (six) hours as needed for mild pain.   carbidopa-levodopa-entacapone 37.5-150-200 MG tablet Commonly known as: STALEVO Take 1 tablet by mouth 4 (four) times daily.   clonazePAM 1 MG tablet Commonly known as: KLONOPIN Take by mouth.   Cranberry 400 MG Caps Take by mouth.   Gocovri 137 MG Cp24 Generic drug: Amantadine HCl ER Take 1 capsule by mouth at bedtime.   melatonin 5 MG Tabs Take 1 tablet (5 mg total) by mouth at bedtime as needed.   omeprazole 20 MG capsule Commonly known as: PRILOSEC Take 1 capsule by mouth at bedtime.   polyethylene glycol powder 17 GM/SCOOP powder Commonly known as: GLYCOLAX/MIRALAX Take by mouth.   QUEtiapine 25 MG tablet Commonly known as: SEROQUEL TAKE 1 TABLET(25 MG) BY MOUTH EVERY NIGHT   rasagiline 1 MG Tabs tablet Commonly known as: AZILECT Take 1 mg by mouth daily.   sulfamethoxazole-trimethoprim 800-160 MG tablet Commonly known as: BACTRIM DS Take 1 tablet by mouth every 12 (twelve) hours.   tamsulosin 0.4 MG Caps capsule Commonly known as: FLOMAX TAKE 1 CAPSULE(0.4 MG) BY MOUTH DAILY WITH LUNCH        Allergies:  No Known Allergies  Family History: No family history on  file.  Social History:   reports that he has never smoked. He has never used smokeless tobacco. He reports that he does not currently use alcohol. No history on file for drug use.  Physical Exam: BP (!) 105/55   Pulse 69   Constitutional:  Alert and oriented, no acute distress, nontoxic appearing HEENT: [place], AT Cardiovascular: No clubbing, cyanosis, or edema Respiratory: Normal respiratory effort, no increased work of breathing Skin: No rashes, bruises or suspicious lesions Neurologic: Grossly intact, no focal deficits, moving all 4  extremities Psychiatric: Normal mood and affect  Laboratory Data: Results for orders placed or performed in visit on 03/24/23  Microscopic Examination   Urine  Result Value Ref Range   WBC, UA >30 (A) 0 - 5 /hpf   RBC, Urine 3-10 (A) 0 - 2 /hpf   Epithelial Cells (non renal) 0-10 0 - 10 /hpf   Bacteria, UA Few None seen/Few  Urinalysis, Complete  Result Value Ref Range   Specific Gravity, UA 1.025 1.005 - 1.030   pH, UA 5.5 5.0 - 7.5   Color, UA Orange Yellow   Appearance Ur Cloudy (A) Clear   Leukocytes,UA 3+ (A) Negative   Protein,UA 3+ (A) Negative/Trace   Glucose, UA Trace (A) Negative   Ketones, UA 1+ (A) Negative   RBC, UA 3+ (A) Negative   Bilirubin, UA Negative Negative   Urobilinogen, Ur 1.0 0.2 - 1.0 mg/dL   Nitrite, UA Negative Negative   Microscopic Examination See below:    Assessment & Plan:   1. Gross hematuria Unclear if gross hematuria is new or intermittent.  Given prior cystoscopy findings, anticipate etiology is infection versus prostate.  Will treat with Macrobid for 10 days and send for culture and have him follow-up with Dr. Lonna Cobb for cystoscopy in 2 weeks.  Would consider adding finasteride at that time if bleeding is felt to be prostatic in origin.  I agree we can defer CT urogram at this time given recent renal ultrasound and frailty. - Urinalysis, Complete - CULTURE, URINE COMPREHENSIVE - nitrofurantoin, macrocrystal-monohydrate, (MACROBID) 100 MG capsule; Take 1 capsule (100 mg total) by mouth 2 (two) times daily for 10 days.  Dispense: 20 capsule; Refill: 0   Return in about 2 weeks (around 04/07/2023) for Cysto with Dr. Lonna Cobb.  Carman Ching, PA-C  [facility] [place] [address] [place], [place] [address] [phone]

## 2023-03-30 LAB — CULTURE, URINE COMPREHENSIVE

## 2023-04-12 ENCOUNTER — Ambulatory Visit: Payer: Medicare PPO | Admitting: Urology

## 2023-04-12 VITALS — BP 115/73 | HR 71 | Ht 68.0 in | Wt 130.0 lb

## 2023-04-12 DIAGNOSIS — R3915 Urgency of urination: Secondary | ICD-10-CM

## 2023-04-12 DIAGNOSIS — R310 Gross hematuria: Secondary | ICD-10-CM

## 2023-04-12 DIAGNOSIS — R350 Frequency of micturition: Secondary | ICD-10-CM

## 2023-04-12 DIAGNOSIS — R300 Dysuria: Secondary | ICD-10-CM

## 2023-04-12 LAB — MICROSCOPIC EXAMINATION: WBC, UA: >30 /hpf — AB (ref 0–5)

## 2023-04-12 LAB — URINALYSIS, COMPLETE
Bilirubin, UA: NEGATIVE
Glucose, UA: NEGATIVE
Nitrite, UA: POSITIVE — AB
Specific Gravity, UA: 1.025 (ref 1.005–1.030)
Urobilinogen, Ur: 0.2 mg/dL (ref 0.2–1.0)
pH, UA: 6.5 (ref 5.0–7.5)

## 2023-04-12 MED ORDER — DOXYCYCLINE HYCLATE 100 MG PO CAPS: 100.0000 mg | ORAL_CAPSULE | Freq: Two times a day (BID) | ORAL | 0 refills | Status: AC

## 2023-04-12 NOTE — Progress Notes (Signed)
Scheduled for cystoscopy today however he is complaining of frequency urgency and dysuria.  UA with >30 WBC and nitrite positive.  Cystoscopy scheduled secondary to gross hematuria.  Urine culture ordered-start doxycycline 100 mg twice daily pending urine culture  Cystoscopy rescheduled

## 2023-04-17 ENCOUNTER — Other Ambulatory Visit: Payer: Self-pay | Admitting: *Deleted

## 2023-04-17 MED ORDER — SULFAMETHOXAZOLE-TRIMETHOPRIM 400-80 MG PO TABS: 1.0000 | ORAL_TABLET | Freq: Every day | ORAL | 0 refills | Status: AC

## 2023-04-25 ENCOUNTER — Telehealth: Payer: Self-pay

## 2023-04-25 NOTE — Telephone Encounter (Signed)
Message left on admin line- [phone]  Pts wife states pt is still infected. Should he keep his app for Friday or r/s.   Called x 2. N/a- No vm.

## 2023-04-27 ENCOUNTER — Other Ambulatory Visit: Payer: Medicare PPO

## 2023-04-27 ENCOUNTER — Other Ambulatory Visit: Payer: Self-pay

## 2023-04-27 DIAGNOSIS — R8281 Pyuria: Secondary | ICD-10-CM

## 2023-04-27 LAB — URINALYSIS, COMPLETE
Bilirubin, UA: NEGATIVE
Glucose, UA: NEGATIVE
Nitrite, UA: NEGATIVE
Specific Gravity, UA: >1.03 — ABNORMAL HIGH (ref 1.005–1.030)
Urobilinogen, Ur: 1 mg/dL (ref 0.2–1.0)
pH, UA: 5 (ref 5.0–7.5)

## 2023-04-27 LAB — MICROSCOPIC EXAMINATION: RBC, Urine: >30 /hpf — AB (ref 0–2)

## 2023-04-27 NOTE — Telephone Encounter (Signed)
Mrs Pearsall called stating that patient finished one antibiotic and has started on the new one as instructed by Dr Lonna Cobb. Patient still sees some blood in the urine and has some burning with urination but not every time he urinates. They want to know if he should still come for cystoscopy tomorrow or reschedule again? Please call [phone] (Home)

## 2023-04-27 NOTE — Telephone Encounter (Signed)
See if he can come today for a UA/possible culture and we will make a decision once urinalysis reviewed

## 2023-04-27 NOTE — Telephone Encounter (Signed)
Dr Lonna Cobb reviewed urine results and stated patient is to still come in for Cysto tomorrow but No UA is needed at that time. Mrs Joshua George advised.

## 2023-04-27 NOTE — Telephone Encounter (Signed)
Joshua George advised and they will come later today for UA check

## 2023-04-28 ENCOUNTER — Ambulatory Visit: Payer: Medicare PPO | Admitting: Urology

## 2023-04-28 ENCOUNTER — Encounter: Payer: Self-pay | Admitting: Urology

## 2023-04-28 VITALS — BP 112/69 | HR 62 | Wt 125.0 lb

## 2023-04-28 DIAGNOSIS — R319 Hematuria, unspecified: Secondary | ICD-10-CM

## 2023-04-28 DIAGNOSIS — R310 Gross hematuria: Secondary | ICD-10-CM

## 2023-04-28 DIAGNOSIS — N138 Other obstructive and reflux uropathy: Secondary | ICD-10-CM

## 2023-04-28 DIAGNOSIS — N400 Enlarged prostate without lower urinary tract symptoms: Secondary | ICD-10-CM | POA: Diagnosis not present

## 2023-04-28 MED ORDER — FINASTERIDE 5 MG PO TABS: 5.0000 mg | ORAL_TABLET | Freq: Every day | ORAL | 0 refills | Status: DC

## 2023-04-28 NOTE — Progress Notes (Signed)
   04/28/23  CC:  Chief Complaint  Patient presents with   Cysto    HPI: Refer to [facility] previous note 03/24/2023.  Significant pyuria at his initial scheduled cystoscopy appointment and urine grew MRSA.  Follow-up UA yesterday with greater than 30 RBCs and 11-30 WBC and he was on low-dose antibiotic  Blood pressure 112/69, pulse 62, weight 125 lb (56.7 kg). NED. A&Ox3.   No respiratory distress   Abd soft, NT, ND Normal phallus with bilateral descended testicles  Cystoscopy Procedure Note  Patient identification was confirmed, informed consent was obtained, and patient was prepped using Betadine solution.  Lidocaine jelly was administered per urethral meatus.     Pre-Procedure: - Inspection reveals a normal caliber urethral meatus.  Procedure: The flexible cystoscope was introduced without difficulty - No urethral strictures/lesions are present. -Prominent lateral lobe enlargement prostate with hypervascularity and oozing on cystoscope passage - Elevated bladder neck; moderate median lobe - Bilateral ureteral orifices identified -Visualization suboptimal due to cloudy urine though no definite solid/papillary tumor or mucosal abnormality identified - No bladder stones -Mild-moderate trabeculation  Retroflexion shows slight lateral lobe intravesical extension   Post-Procedure: - Patient tolerated the procedure well  Assessment/ Plan: BPH with prominent hypervascularity No definite bladder mucosal lesions identified Start finasteride 5 mg daily PA follow-up 6 months recheck Consider PAE for continued hematuria   Riki Altes, MD

## 2023-05-02 LAB — CULTURE, URINE COMPREHENSIVE

## 2023-05-29 NOTE — Progress Notes (Unsigned)
05/30/23 2:23 PM   Joshua George July 20, 1941 82 [id number]  Referring provider:  Kandyce Rud, MD [address] [facility] [place],  [place] [address]  Urological history 1. Urinary retention -incidental finding of retention after presenting to the ED for delirium -1800 mL in the bladder -cysto 2022  Prominent lateral lobe enlargement prostate - Moderate median lobe - Moderate trabeculation   2. BPH with retention -PSA 3.39 in 03/2021 -RUS (2024) prostate volume 76 cc -cysto (04/2023) -prominent lateral lobe enlargement, elevated bladder neck, moderate median lobe and mild to moderate trabeculation -tamsulosin 0.4 mg daily  3. Renal mass -RUS (05/2022) - 11 mm hypoechoic mass exophytic off the midpole of the right kidney not visualized on comparison studies. This mass could represent a complex/complicated cyst versus a solid mass.  Given the history of hematuria, recommend MRI of the abdomen with and without contrast for complete characterization. If MRI is not pursued, recommend a six-month follow-up ultrasound to ensure stability -RUS (11/2022) - The bladder is poorly distended limiting evaluation. Possible mild wall thickening measuring up to 5 mm.  The prostate demonstrates a 76 cc volume which is enlarged. The kidneys are unremarkable.  4. High risk Hematuria -non smoker -RUS (11/2022) - no worrisome findings -cysto (04/2023) - prostate hypervascularity   Chief Complaint  Patient presents with   Follow-up    HPI: Joshua George is a 82 y.o.male who returns for six month follow up with his wife, Joshua George.   Previous records reviewed.   He has not  had any further episodes of gross hematuria.  His wife wanted his urine checked for infection.    Patient denies any modifying or aggravating factors.  Patient denies any recent UTI's, gross hematuria, dysuria or suprapubic/flank pain.  Patient denies any fevers, chills, nausea or vomiting.     Ua yellow clear, trace glucose, 1+ ketone, specific gravity greater than 1.030, pH 5.5, positive nitrates, trace leukocytes, 6-10 WBCs, 0-2 RBCs, 0-10 epithelial cells, hyaline cast present, mucus threads present and a few bacteria  PVR 1 mL   PMH: Past Medical History:  Diagnosis Date   BPH (benign prostatic hyperplasia)    Dementia (HCC)    GERD (gastroesophageal reflux disease)    Parkinson's disease     Surgical History: Past Surgical History:  Procedure Laterality Date   TONSILLECTOMY      Home Medications:  Allergies as of 05/30/2023   No Known Allergies      Medication List        Accurate as of May 30, 2023  2:23 PM. If you have any questions, ask your nurse or doctor.          acetaminophen 325 MG tablet Commonly known as: TYLENOL Take 325 mg by mouth every 6 (six) hours as needed for mild pain.   carbidopa-levodopa-entacapone 37.5-150-200 MG tablet Commonly known as: STALEVO Take 1 tablet by mouth 4 (four) times daily.   clonazePAM 1 MG tablet Commonly known as: KLONOPIN Take by mouth.   Cranberry 400 MG Caps Take by mouth.   finasteride 5 MG tablet Commonly known as: PROSCAR Take 1 tablet (5 mg total) by mouth daily.   Gocovri 137 MG Cp24 Generic drug: Amantadine HCl ER Take 1 capsule by mouth at bedtime.   melatonin 5 MG Tabs Take 1 tablet (5 mg total) by mouth at bedtime as needed.   omeprazole 20 MG capsule Commonly known as: PRILOSEC Take 1 capsule by mouth at bedtime.  polyethylene glycol powder 17 GM/SCOOP powder Commonly known as: GLYCOLAX/MIRALAX Take by mouth.   QUEtiapine 25 MG tablet Commonly known as: SEROQUEL TAKE 1 TABLET(25 MG) BY MOUTH EVERY NIGHT   rasagiline 1 MG Tabs tablet Commonly known as: AZILECT Take 1 mg by mouth daily.   tamsulosin 0.4 MG Caps capsule Commonly known as: FLOMAX TAKE 1 CAPSULE(0.4 MG) BY MOUTH DAILY WITH LUNCH        Allergies:  No Known Allergies  Family History: No  family history on file.  Social History:  reports that he has never smoked. He has never used smokeless tobacco. He reports that he does not currently use alcohol. No history on file for drug use.   Physical Exam: BP 131/73   Pulse 64   Ht 5\' 8"  (1.727 m)   Wt 125 lb (56.7 kg)   BMI 19.01 kg/m   Constitutional:  Well nourished. Alert and oriented, No acute distress. HEENT: [place] AT, moist mucus membranes.  Trachea midline Cardiovascular: No clubbing, cyanosis, or edema. Respiratory: Normal respiratory effort, no increased work of breathing. Neurologic: Grossly intact, no focal deficits, moving all 4 extremities. Psychiatric: Normal mood and affect.   Laboratory Data: N/A  Pertinent Imaging   05/30/23 13:36  Scan Result 1 ml    Assessment/plan:  1. BPH -voiding well -Continue tamsulosin 0.4 mg daily -continue finasteride 5 mg daily  2. High risk hematuria -non-smoker -recent RUS and cysto w/o worrisome findings -no reports of gross heme -continue finasteride as hematuria may be the result of BPH  3. Suspected UTI -Discussed ID guidelines regarding screening for urinary tract infection and is not advisable.  He is wife feels that he does not have symptoms at this time, but the gross hematuria happens so abruptly and without warning she is fearful it may recur again -I explained I will go ahead and send the urine for culture and we will contact her regarding the results, but even if it returns "positive" for infection, it is not advisable to prescribe antibiotics and that she feels he is having UTI symptoms  Return in about 6 months (around 11/27/2023) for PVR .  Cloretta Ned   [facility] [address], [address] [place], [place] [address] [phone]

## 2023-05-30 ENCOUNTER — Ambulatory Visit: Payer: Medicare PPO | Admitting: Urology

## 2023-05-30 ENCOUNTER — Encounter: Payer: Self-pay | Admitting: Urology

## 2023-05-30 VITALS — BP 131/73 | HR 64 | Ht 68.0 in | Wt 125.0 lb

## 2023-05-30 DIAGNOSIS — R3989 Other symptoms and signs involving the genitourinary system: Secondary | ICD-10-CM | POA: Diagnosis not present

## 2023-05-30 DIAGNOSIS — R319 Hematuria, unspecified: Secondary | ICD-10-CM | POA: Diagnosis not present

## 2023-05-30 DIAGNOSIS — N138 Other obstructive and reflux uropathy: Secondary | ICD-10-CM

## 2023-05-30 DIAGNOSIS — N401 Enlarged prostate with lower urinary tract symptoms: Secondary | ICD-10-CM

## 2023-05-30 LAB — URINALYSIS, COMPLETE
Bilirubin, UA: NEGATIVE
Nitrite, UA: POSITIVE — AB
Protein,UA: NEGATIVE
RBC, UA: NEGATIVE
Specific Gravity, UA: >1.03 — ABNORMAL HIGH (ref 1.005–1.030)
Urobilinogen, Ur: 0.2 mg/dL (ref 0.2–1.0)
pH, UA: 5.5 (ref 5.0–7.5)

## 2023-05-30 LAB — MICROSCOPIC EXAMINATION

## 2023-05-30 LAB — BLADDER SCAN AMB NON-IMAGING: Scan Result: 1

## 2023-05-30 MED ORDER — FINASTERIDE 5 MG PO TABS
5.0000 mg | ORAL_TABLET | Freq: Every day | ORAL | 3 refills | Status: DC
Start: 2023-05-30 — End: 2024-05-24

## 2023-06-03 LAB — CULTURE, URINE COMPREHENSIVE

## 2023-08-04 ENCOUNTER — Other Ambulatory Visit: Payer: Self-pay | Admitting: Urology

## 2023-08-04 DIAGNOSIS — N138 Other obstructive and reflux uropathy: Secondary | ICD-10-CM

## 2023-08-25 ENCOUNTER — Observation Stay
Admission: EM | Admit: 2023-08-25 | Discharge: 2023-08-26 | Disposition: A | Discharge status: Home / Self Care | Payer: Medicare PPO | Attending: Internal Medicine | Admitting: Internal Medicine

## 2023-08-25 ENCOUNTER — Emergency Department: Payer: Medicare PPO

## 2023-08-25 ENCOUNTER — Other Ambulatory Visit: Payer: Self-pay

## 2023-08-25 ENCOUNTER — Inpatient Hospital Stay: Payer: Medicare PPO

## 2023-08-25 ENCOUNTER — Ambulatory Visit: Payer: Medicare PPO

## 2023-08-25 DIAGNOSIS — G20C Parkinsonism, unspecified: Secondary | ICD-10-CM | POA: Insufficient documentation

## 2023-08-25 DIAGNOSIS — R4182 Altered mental status, unspecified: Secondary | ICD-10-CM | POA: Diagnosis not present

## 2023-08-25 DIAGNOSIS — R569 Unspecified convulsions: Secondary | ICD-10-CM | POA: Diagnosis not present

## 2023-08-25 DIAGNOSIS — F05 Delirium due to known physiological condition: Secondary | ICD-10-CM | POA: Diagnosis not present

## 2023-08-25 DIAGNOSIS — Z79899 Other long term (current) drug therapy: Secondary | ICD-10-CM | POA: Insufficient documentation

## 2023-08-25 DIAGNOSIS — E860 Dehydration: Secondary | ICD-10-CM | POA: Insufficient documentation

## 2023-08-25 DIAGNOSIS — F0390 Unspecified dementia without behavioral disturbance: Secondary | ICD-10-CM | POA: Diagnosis not present

## 2023-08-25 DIAGNOSIS — G20A1 Parkinson's disease without dyskinesia, without mention of fluctuations: Secondary | ICD-10-CM | POA: Diagnosis present

## 2023-08-25 DIAGNOSIS — R410 Disorientation, unspecified: Secondary | ICD-10-CM | POA: Diagnosis not present

## 2023-08-25 DIAGNOSIS — G20B2 Parkinson's disease with dyskinesia, with fluctuations: Secondary | ICD-10-CM

## 2023-08-25 LAB — URINALYSIS, ROUTINE W REFLEX MICROSCOPIC
Bacteria, UA: NONE SEEN
Bilirubin Urine: NEGATIVE
Glucose, UA: NEGATIVE mg/dL
Ketones, ur: NEGATIVE mg/dL
Leukocytes,Ua: NEGATIVE
Nitrite: NEGATIVE
Protein, ur: NEGATIVE mg/dL
Specific Gravity, Urine: 1.013 (ref 1.005–1.030)
Squamous Epithelial / HPF: 0 /[HPF] (ref 0–5)
pH: 7 (ref 5.0–8.0)

## 2023-08-25 LAB — COMPREHENSIVE METABOLIC PANEL
ALT: 6 U/L (ref 0–44)
AST: 23 U/L (ref 15–41)
Albumin: 4.2 g/dL (ref 3.5–5.0)
Alkaline Phosphatase: 73 U/L (ref 38–126)
Anion gap: 7 (ref 5–15)
BUN: 29 mg/dL — ABNORMAL HIGH (ref 8–23)
CO2: 29 mmol/L (ref 22–32)
Calcium: 8.8 mg/dL — ABNORMAL LOW (ref 8.9–10.3)
Chloride: 102 mmol/L (ref 98–111)
Creatinine, Ser: 1.33 mg/dL — ABNORMAL HIGH (ref 0.61–1.24)
GFR, Estimated: 54 mL/min — ABNORMAL LOW (ref >60)
Glucose, Bld: 99 mg/dL (ref 70–99)
Potassium: 4.3 mmol/L (ref 3.5–5.1)
Sodium: 138 mmol/L (ref 135–145)
Total Bilirubin: 0.9 mg/dL (ref <1.2)
Total Protein: 7.2 g/dL (ref 6.5–8.1)

## 2023-08-25 LAB — CBC
HCT: 40.5 % (ref 39.0–52.0)
Hemoglobin: 13.4 g/dL (ref 13.0–17.0)
MCH: 30 pg (ref 26.0–34.0)
MCHC: 33.1 g/dL (ref 30.0–36.0)
MCV: 90.6 fL (ref 80.0–100.0)
Platelets: 148 10*3/uL — ABNORMAL LOW (ref 150–400)
RBC: 4.47 MIL/uL (ref 4.22–5.81)
RDW: 12.8 % (ref 11.5–15.5)
WBC: 5.4 10*3/uL (ref 4.0–10.5)
nRBC: 0 % (ref 0.0–0.2)

## 2023-08-25 LAB — TSH: TSH: 9.287 u[IU]/mL — ABNORMAL HIGH (ref 0.350–4.500)

## 2023-08-25 LAB — BLOOD GAS, ARTERIAL
Acid-Base Excess: 5.2 mmol/L — ABNORMAL HIGH (ref 0.0–2.0)
Bicarbonate: 29.9 mmol/L — ABNORMAL HIGH (ref 20.0–28.0)
O2 Saturation: 99.7 %
Patient temperature: 37
pCO2 arterial: 43 mm[Hg] (ref 32–48)
pH, Arterial: 7.45 (ref 7.35–7.45)
pO2, Arterial: 109 mm[Hg] — ABNORMAL HIGH (ref 83–108)

## 2023-08-25 LAB — LACTIC ACID, PLASMA: Lactic Acid, Venous: 1 mmol/L (ref 0.5–1.9)

## 2023-08-25 MED ORDER — PREDNISONE 20 MG PO TABS
20.0000 mg | ORAL_TABLET | Freq: Once | ORAL | Status: AC
  Administered 2023-08-25: 20 mg via ORAL
  Filled 2023-08-25: qty 1

## 2023-08-25 MED ORDER — FINASTERIDE 5 MG PO TABS
5.0000 mg | ORAL_TABLET | Freq: Every day | ORAL | Status: DC
  Administered 2023-08-26: 5 mg via ORAL
  Filled 2023-08-25: qty 1

## 2023-08-25 MED ORDER — SODIUM CHLORIDE 0.9 % IV SOLN: INTRAVENOUS | Status: AC

## 2023-08-25 MED ORDER — AMANTADINE HCL ER 137 MG PO CP24: 1.0000 | ORAL_CAPSULE | Freq: Every day | ORAL | Status: DC

## 2023-08-25 MED ORDER — LORAZEPAM 2 MG/ML IJ SOLN: 0.5000 mg | INTRAMUSCULAR | Status: DC | PRN

## 2023-08-25 MED ORDER — ENTACAPONE 200 MG PO TABS
200.0000 mg | ORAL_TABLET | Freq: Four times a day (QID) | ORAL | Status: DC
  Administered 2023-08-25 – 2023-08-26 (×5): 200 mg via ORAL
  Filled 2023-08-25 (×6): qty 1

## 2023-08-25 MED ORDER — ACETAMINOPHEN 325 MG PO TABS: 325.0000 mg | ORAL_TABLET | Freq: Four times a day (QID) | ORAL | Status: DC | PRN

## 2023-08-25 MED ORDER — RASAGILINE MESYLATE 1 MG PO TABS
1.0000 mg | ORAL_TABLET | Freq: Every day | ORAL | Status: DC
  Filled 2023-08-25: qty 1

## 2023-08-25 MED ORDER — RASAGILINE MESYLATE 1 MG PO TABS
1.0000 mg | ORAL_TABLET | Freq: Every day | ORAL | Status: DC
  Administered 2023-08-26: 1 mg via ORAL
  Filled 2023-08-25: qty 1

## 2023-08-25 MED ORDER — FINASTERIDE 5 MG PO TABS
5.0000 mg | ORAL_TABLET | Freq: Every day | ORAL | Status: DC
  Filled 2023-08-25: qty 1

## 2023-08-25 MED ORDER — PANTOPRAZOLE SODIUM 40 MG PO TBEC
40.0000 mg | DELAYED_RELEASE_TABLET | Freq: Every day | ORAL | Status: DC
Start: 2023-08-25 — End: 2023-08-25

## 2023-08-25 MED ORDER — CARBIDOPA-LEVODOPA-ENTACAPONE 37.5-150-200 MG PO TABS: 1.0000 | ORAL_TABLET | Freq: Four times a day (QID) | ORAL | Status: DC

## 2023-08-25 MED ORDER — LABETALOL HCL 5 MG/ML IV SOLN
10.0000 mg | INTRAVENOUS | Status: DC | PRN
  Administered 2023-08-25 – 2023-08-26 (×3): 10 mg via INTRAVENOUS
  Filled 2023-08-25 (×3): qty 4

## 2023-08-25 MED ORDER — CARBIDOPA-LEVODOPA 25-100 MG PO TABS
1.5000 | ORAL_TABLET | Freq: Four times a day (QID) | ORAL | Status: DC
  Administered 2023-08-25 – 2023-08-26 (×5): 1.5 via ORAL
  Filled 2023-08-25: qty 1.5
  Filled 2023-08-25 (×2): qty 2
  Filled 2023-08-25 (×2): qty 1.5

## 2023-08-25 MED ORDER — ENOXAPARIN SODIUM 40 MG/0.4ML IJ SOSY
40.0000 mg | PREFILLED_SYRINGE | INTRAMUSCULAR | Status: DC
  Administered 2023-08-25 – 2023-08-26 (×2): 40 mg via SUBCUTANEOUS
  Filled 2023-08-25 (×2): qty 0.4

## 2023-08-25 MED ORDER — ONDANSETRON HCL 4 MG PO TABS: 4.0000 mg | ORAL_TABLET | Freq: Four times a day (QID) | ORAL | Status: DC | PRN

## 2023-08-25 MED ORDER — QUETIAPINE FUMARATE 25 MG PO TABS
25.0000 mg | ORAL_TABLET | Freq: Every day | ORAL | Status: DC
  Administered 2023-08-25: 25 mg via ORAL
  Filled 2023-08-25: qty 1

## 2023-08-25 MED ORDER — CLONAZEPAM 1 MG PO TABS
1.0000 mg | ORAL_TABLET | Freq: Two times a day (BID) | ORAL | Status: DC | PRN
Start: 2023-08-26 — End: 2023-08-26

## 2023-08-25 MED ORDER — PANTOPRAZOLE SODIUM 40 MG PO TBEC
40.0000 mg | DELAYED_RELEASE_TABLET | Freq: Every day | ORAL | Status: DC
  Administered 2023-08-26: 40 mg via ORAL
  Filled 2023-08-25: qty 1

## 2023-08-25 MED ORDER — MELATONIN 5 MG PO TABS: 5.0000 mg | ORAL_TABLET | Freq: Every evening | ORAL | Status: DC | PRN

## 2023-08-25 MED ORDER — TAMSULOSIN HCL 0.4 MG PO CAPS
0.4000 mg | ORAL_CAPSULE | Freq: Every day | ORAL | Status: DC
  Administered 2023-08-25: 0.4 mg via ORAL
  Filled 2023-08-25: qty 1

## 2023-08-25 MED ORDER — POLYETHYLENE GLYCOL 3350 17 GM/SCOOP PO POWD: 1.0000 | Freq: Every day | ORAL | Status: DC | PRN

## 2023-08-25 MED ORDER — CLONAZEPAM 0.5 MG PO TABS: 1.0000 mg | ORAL_TABLET | Freq: Two times a day (BID) | ORAL | Status: DC | PRN

## 2023-08-25 MED ORDER — HYDRALAZINE HCL 20 MG/ML IJ SOLN
5.0000 mg | Freq: Four times a day (QID) | INTRAMUSCULAR | Status: DC | PRN
  Administered 2023-08-25: 5 mg via INTRAVENOUS
  Filled 2023-08-25: qty 1

## 2023-08-25 MED ORDER — ONDANSETRON HCL 4 MG/2ML IJ SOLN: 4.0000 mg | Freq: Four times a day (QID) | INTRAMUSCULAR | Status: DC | PRN

## 2023-08-25 NOTE — ED Triage Notes (Signed)
Patient presents via acems with c/o altered mental status. Patient lives at home with wife- was found on the floor at home. Patient's wife reports patient did not hit his head, per ems. Patient is unresponsive to verbal stimuli, hx of parkinson's. Patient's wife reports patient has previously acted similar and was diagnosed with UTI. Patient is not responding to questions at this time.

## 2023-08-25 NOTE — ED Notes (Signed)
CBG:90 

## 2023-08-25 NOTE — Procedures (Signed)
Patient Name: Joshua George  MRN: [id number]  Epilepsy Attending: Charlsie Quest  Referring Physician/Provider: Emeline General, MD  Date: 08/25/2023 Duration: 27.50 mins  Patient history: 82 yo M with ams getting eeg to evaluate for seizure  Level of alertness: Awake, asleep  AEDs during EEG study: None  Technical aspects: This EEG study was done with scalp electrodes positioned according to the 10-20 International system of electrode placement. Electrical activity was reviewed with band pass filter of 1-70Hz , sensitivity of 7 uV/mm, display speed of 27mm/sec with a 60Hz  notched filter applied as appropriate. EEG data were recorded continuously and digitally stored.  Video monitoring was available and reviewed as appropriate.  Description: The posterior dominant rhythm consists of 7 Hz activity of moderate voltage (25-35 uV) seen predominantly in posterior head regions, symmetric and reactive to eye opening and eye closing. Sleep was characterized by vertex waves, sleep spindles (12 to 14 Hz), maximal frontocentral region. EEG showed continuous generalized 5 to 7 Hz theta slowing. Hyperventilation and photic stimulation were not performed.     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 No seizures or epileptiform discharges were seen throughout the recording.  Gabryel Talamo Annabelle Harman

## 2023-08-25 NOTE — H&P (Signed)
History and Physical    Joshua George ZOX:[id number] DOB: 1941/02/18 DOA: 08/25/2023  PCP: Kandyce Rud, MD (Confirm with patient/family/NH records and if not entered, this has to be entered at [facility] point of entry) Patient coming from: Home  I have personally briefly reviewed patient's old medical records in [facility]  Chief Complaint: AMS  HPI: Joshua George is a 82 y.o. male with medical history significant of Parkinson disease, dementia, BPH, brought in by family member for evaluation of delirium and hallucination.  Patient unable to provide any history, all history provided by wife at bedside.  According to wife, patient was diagnosed with Parkinson disease with visual hallucination 14 years ago has been on Sinemet.  Most recent neurology visit was in February this year and patient was considered to be stable, and no medication changes recently.  Occasionally, patient does have brief episode of visual hallucination however last night, wife found the patient suddenly became confused and talking things that does not exist and clearly has constant visual hallucination.  Wife said that patient does not have any sleep cycle disturbance and has been eating and drinking as usual.  Wife attributed to the mentation changes to possible recurrent UTI which patient had 4 months ago with similar mentation changes symptoms.  In addition wife reported patient only has questionable dementia, as patient cannot take care for himself at home on most occasions.  ED Course: Afebrile, blood pressure elevated SBP 170-180, nonhypoxic.  CT head showed no acute findings but chronic brain atrophy.  Blood work showed WBC 5.4, creatinine 1.3 compared to baseline 0.8-3.9, bicarb 29, K4.3.  Glucose 99.  UA showed no signs of UTI.  VBG showed no CO2 retention.  Review of Systems: Unable to perform, patient is confused.  Past Medical History:  Diagnosis Date   BPH (benign prostatic hyperplasia)    Dementia (HCC)     GERD (gastroesophageal reflux disease)    Parkinson's disease     Past Surgical History:  Procedure Laterality Date   TONSILLECTOMY       reports that he has never smoked. He has never used smokeless tobacco. He reports that he does not currently use alcohol. No history on file for drug use.  No Known Allergies  No family history on file.   Prior to Admission medications   Medication Sig Start Date End Date Taking? Authorizing Provider  acetaminophen (TYLENOL) 325 MG tablet Take 325 mg by mouth every 6 (six) hours as needed for mild pain. 08/21/19   [provider]  carbidopa-levodopa-entacapone (STALEVO) 37.5-150-200 MG tablet Take 1 tablet by mouth 4 (four) times daily. 10/09/20   [provider]  clonazePAM (KLONOPIN) 1 MG tablet Take by mouth. 02/18/21 03/20/21  [provider]  Cranberry 400 MG CAPS Take by mouth.    [provider]  finasteride (PROSCAR) 5 MG tablet Take 1 tablet (5 mg total) by mouth daily. 05/30/23   McGowan, Shannon A, PA-C  GOCOVRI 137 MG CP24 Take 1 capsule by mouth at bedtime. 09/30/20   [provider]  melatonin 5 MG TABS Take 1 tablet (5 mg total) by mouth at bedtime as needed. 10/28/20   Lurene Shadow, MD  omeprazole (PRILOSEC) 20 MG capsule Take 1 capsule by mouth at bedtime. 12/31/20   [provider]  polyethylene glycol powder (GLYCOLAX/MIRALAX) 17 GM/SCOOP powder Take by mouth.    [provider]  QUEtiapine (SEROQUEL) 25 MG tablet TAKE 1 TABLET(25 MG) BY MOUTH EVERY NIGHT 02/22/21  [provider]  rasagiline (AZILECT) 1 MG TABS tablet Take 1 mg by mouth daily. 08/11/20   [provider]  tamsulosin (FLOMAX) 0.4 MG CAPS capsule TAKE 1 CAPSULE(0.4 MG) BY MOUTH DAILY WITH LUNCH 08/04/23   Harle Battiest, PA-C    Physical Exam: Vitals:   08/25/23 0408 08/25/23 0425 08/25/23 0530 08/25/23 0806  BP: (!) 179/91  (!) 181/89 (!) 191/86  Pulse: 81  69 65  Resp: 14  15 13    Temp: 98.4 F (36.9 C)   97.9 F (36.6 C)  TempSrc:    Axillary  SpO2: 100%  99% 99%  Weight:  62.6 kg    Height:  5\' 8"  (1.727 m)      Constitutional: NAD, calm, comfortable Vitals:   08/25/23 0408 08/25/23 0425 08/25/23 0530 08/25/23 0806  BP: (!) 179/91  (!) 181/89 (!) 191/86  Pulse: 81  69 65  Resp: 14  15 13   Temp: 98.4 F (36.9 C)   97.9 F (36.6 C)  TempSrc:    Axillary  SpO2: 100%  99% 99%  Weight:  62.6 kg    Height:  5\' 8"  (1.727 m)     Eyes: PERRL, lids and conjunctivae normal ENMT: Mucous membranes are dry. Posterior pharynx clear of any exudate or lesions.Normal dentition.  Neck: normal, supple, no masses, no thyromegaly Respiratory: clear to auscultation bilaterally, no wheezing, no crackles. Normal respiratory effort. No accessory muscle use.  Cardiovascular: Regular rate and rhythm, no murmurs / rubs / gallops. No extremity edema. 2+ pedal pulses. No carotid bruits.  Abdomen: no tenderness, no masses palpated. No hepatosplenomegaly. Bowel sounds positive.  Musculoskeletal: no clubbing / cyanosis. No joint deformity upper and lower extremities. Good ROM, no contractures. Normal muscle tone.  Skin: no rashes, lesions, ulcers. No induration Neurologic: No facial droops, moving all limbs, slightly increased of muscle rigidity Psychiatric: Sleepy, easily arousable, oriented to himself, confused about time and place    Labs on Admission: I have personally reviewed following labs and imaging studies  CBC: Recent Labs  Lab 08/25/23 0410  WBC 5.4  HGB 13.4  HCT 40.5  MCV 90.6  PLT 148*   Basic Metabolic Panel: Recent Labs  Lab 08/25/23 0410  NA 138  K 4.3  CL 102  CO2 29  GLUCOSE 99  BUN 29*  CREATININE 1.33*  CALCIUM 8.8*   GFR: Estimated Creatinine Clearance: 38.6 mL/min (A) (by C-G formula based on SCr of 1.33 mg/dL (H)). Liver Function Tests: Recent Labs  Lab 08/25/23 0410  AST 23  ALT 6  ALKPHOS 73  BILITOT 0.9  PROT 7.2  ALBUMIN  4.2   No results for input(s): "LIPASE", "AMYLASE" in the last 168 hours. No results for input(s): "AMMONIA" in the last 168 hours. Coagulation Profile: No results for input(s): "INR", "PROTIME" in the last 168 hours. Cardiac Enzymes: No results for input(s): "CKTOTAL", "CKMB", "CKMBINDEX", "TROPONINI" in the last 168 hours. BNP (last 3 results) No results for input(s): "PROBNP" in the last 8760 hours. HbA1C: No results for input(s): "HGBA1C" in the last 72 hours. CBG: No results for input(s): "GLUCAP" in the last 168 hours. Lipid Profile: No results for input(s): "CHOL", "HDL", "LDLCALC", "TRIG", "CHOLHDL", "LDLDIRECT" in the last 72 hours. Thyroid Function Tests: No results for input(s): "TSH", "T4TOTAL", "FREET4", "T3FREE", "THYROIDAB" in the last 72 hours. Anemia Panel: No results for input(s): "VITAMINB12", "FOLATE", "FERRITIN", "TIBC", "IRON", "RETICCTPCT" in the last 72 hours. Urine analysis:    Component Value Date/Time  COLORURINE AMBER (A) 08/25/2023 0411   APPEARANCEUR CLEAR (A) 08/25/2023 0411   APPEARANCEUR Clear 05/30/2023 1329   LABSPEC 1.013 08/25/2023 0411   PHURINE 7.0 08/25/2023 0411   GLUCOSEU NEGATIVE 08/25/2023 0411   HGBUR SMALL (A) 08/25/2023 0411   BILIRUBINUR NEGATIVE 08/25/2023 0411   BILIRUBINUR Negative 05/30/2023 1329   KETONESUR NEGATIVE 08/25/2023 0411   PROTEINUR NEGATIVE 08/25/2023 0411   NITRITE NEGATIVE 08/25/2023 0411   LEUKOCYTESUR NEGATIVE 08/25/2023 0411    Radiological Exams on Admission: DG Chest Portable 1 View Result Date: 08/25/2023 CLINICAL DATA:  82 year old male with history of altered mental status. EXAM: PORTABLE CHEST 1 VIEW COMPARISON:  Chest x-ray 10/22/2020. FINDINGS: Lung volumes are low. No consolidative airspace disease. No pleural effusions. No pneumothorax. No pulmonary nodule or mass noted. Pulmonary vasculature and the cardiomediastinal silhouette are within normal limits. Atherosclerotic calcifications in the  thoracic aorta. IMPRESSION: 1. Low lung volumes without radiographic evidence of acute cardiopulmonary disease. 2. Aortic atherosclerosis. Electronically Signed   By: Trudie Reed M.D.   On: 08/25/2023 05:53   CT Head Wo Contrast Result Date: 08/25/2023 CLINICAL DATA:  82 year old male with history of trauma from a fall. Altered mental status. EXAM: CT HEAD WITHOUT CONTRAST CT CERVICAL SPINE WITHOUT CONTRAST TECHNIQUE: Multidetector CT imaging of the head and cervical spine was performed following the standard protocol without intravenous contrast. Multiplanar CT image reconstructions of the cervical spine were also generated. RADIATION DOSE REDUCTION: This exam was performed according to the departmental dose-optimization program which includes automated exposure control, adjustment of the mA and/or kV according to patient size and/or use of iterative reconstruction technique. COMPARISON:  CT head and cervical spine 12/29/2020. FINDINGS: CT HEAD FINDINGS Brain: Mild cerebral and cerebellar atrophy. Patchy and confluent areas of decreased attenuation are noted throughout the deep and periventricular white matter of the cerebral hemispheres bilaterally, compatible with chronic microvascular ischemic disease. No evidence of acute infarction, hemorrhage, hydrocephalus, extra-axial collection or mass lesion/mass effect. Vascular: No hyperdense vessel or unexpected calcification. Skull: Normal. Negative for fracture or focal lesion. Sinuses/Orbits: No acute finding. Other: None. CT CERVICAL SPINE FINDINGS Alignment: Normal. Skull base and vertebrae: No acute fracture. No primary bone lesion or focal pathologic process. Soft tissues and spinal canal: No prevertebral fluid or swelling. No visible canal hematoma. Disc levels: Multilevel degenerative disc disease, most severe at C5-C6 and C6-C7. Upper chest: Mild scarring in the apex of the left hemithorax. Other: None. IMPRESSION: 1. No evidence of significant acute  traumatic injury to the skull, brain or cervical spine. 2. Mild cerebral and cerebellar atrophy with chronic microvascular ischemic changes in the cerebral white matter, as above. 3. Mild multilevel degenerative disc disease and cervical spondylosis, as above. Electronically Signed   By: Trudie Reed M.D.   On: 08/25/2023 05:13   CT Cervical Spine Wo Contrast Result Date: 08/25/2023 CLINICAL DATA:  82 year old male with history of trauma from a fall. Altered mental status. EXAM: CT HEAD WITHOUT CONTRAST CT CERVICAL SPINE WITHOUT CONTRAST TECHNIQUE: Multidetector CT imaging of the head and cervical spine was performed following the standard protocol without intravenous contrast. Multiplanar CT image reconstructions of the cervical spine were also generated. RADIATION DOSE REDUCTION: This exam was performed according to the departmental dose-optimization program which includes automated exposure control, adjustment of the mA and/or kV according to patient size and/or use of iterative reconstruction technique. COMPARISON:  CT head and cervical spine 12/29/2020. FINDINGS: CT HEAD FINDINGS Brain: Mild cerebral and cerebellar atrophy. Patchy and confluent areas of  decreased attenuation are noted throughout the deep and periventricular white matter of the cerebral hemispheres bilaterally, compatible with chronic microvascular ischemic disease. No evidence of acute infarction, hemorrhage, hydrocephalus, extra-axial collection or mass lesion/mass effect. Vascular: No hyperdense vessel or unexpected calcification. Skull: Normal. Negative for fracture or focal lesion. Sinuses/Orbits: No acute finding. Other: None. CT CERVICAL SPINE FINDINGS Alignment: Normal. Skull base and vertebrae: No acute fracture. No primary bone lesion or focal pathologic process. Soft tissues and spinal canal: No prevertebral fluid or swelling. No visible canal hematoma. Disc levels: Multilevel degenerative disc disease, most severe at C5-C6  and C6-C7. Upper chest: Mild scarring in the apex of the left hemithorax. Other: None. IMPRESSION: 1. No evidence of significant acute traumatic injury to the skull, brain or cervical spine. 2. Mild cerebral and cerebellar atrophy with chronic microvascular ischemic changes in the cerebral white matter, as above. 3. Mild multilevel degenerative disc disease and cervical spondylosis, as above. Electronically Signed   By: Trudie Reed M.D.   On: 08/25/2023 05:13    EKG: Independently reviewed.  Sinus rhythm, no acute ST changes.  Assessment/Plan Principal Problem:   Altered mental status Active Problems:   Parkinson's disease (HCC)   Delirium   AMS (altered mental status)  (please populate well all problems here in Problem List. (For example, if patient is on BP meds at home and you resume or decide to hold them, it is a problem that needs to be her. Same for CAD, COPD, HLD and so on)  Delirium with visual hallucination -Neurological exam nonfocal and UTI was ruled out by UA.  Other labs such as CBC BMP and VBG largely within normal limits -Initial screening brain imaging CT head showed negative acute findings. -Case was discussed with on-call neurology Dr. Wilford Corner, who recommend brain MRI and EEG. -Frequent neurochecks, seizure precaution, fall precaution -PT OT evaluation  Dehydration -Clinically patient appears to be volume contracted with signs of dehydration, will give 1 day course of gentle hydration.  Less likely dehydration will contribute to delirium.  Parkinson's disease -Fairly controlled -Continue current regimen of Sinemet and rasagiline  BPH -Stable, continue Flomax and Proscar  Anxiety/depression -Change Klonopin to as needed  DVT prophylaxis: Lovenox Code Status: DNR Family Communication: Wife at bedside Disposition Plan: Patient is sick with significant delirium on top of Parkinson disease, requiring inpatient neurological workup and stabilization, expect more  than 2 midnight hospital stay Consults called: Curbside consultation with neurology, reconsult if necessary Admission status: Telemetry admission   Emeline General MD [facility] Pager [phone]  08/25/2023, 9:11 AM

## 2023-08-25 NOTE — ED Notes (Signed)
 CCMD called.

## 2023-08-25 NOTE — ED Notes (Signed)
MD Zhang told this RN that it was okay to go ahead and give labetalol.

## 2023-08-25 NOTE — ED Provider Notes (Signed)
[facility] Provider Note    Event Date/Time   First MD Initiated Contact with Patient 08/25/23 0401     (approximate)   History   Altered Mental Status   HPI  Joshua George is a 82 y.o. male who presents to the emergency department today because of concerns for altered mental status.  Apparently the patient had gotten up overnight to go to the bathroom.  His wife then found him on the floor of the bathroom.  She does not think he fell per EMS.  Patient is unable to give any history here in the emergency department.  He is minimally responsive to painful stimuli.     Physical Exam   Triage Vital Signs: ED Triage Vitals  Encounter Vitals Group     BP 08/25/23 0408 (!) 179/91     Systolic BP Percentile --      Diastolic BP Percentile --      Pulse Rate 08/25/23 0408 81     Resp 08/25/23 0408 14     Temp 08/25/23 0404 98.4 F (36.9 C)     Temp Source 08/25/23 0404 Oral     SpO2 08/25/23 0408 100 %     Weight --      Height --      Head Circumference --      Peak Flow --      Pain Score --      Pain Loc --      Pain Education --      Exclude from Growth Chart --     Most recent vital signs: Vitals:   08/25/23 0404 08/25/23 0408  BP:  (!) 179/91  Pulse:  81  Resp:  14  Temp: 98.4 F (36.9 C) 98.4 F (36.9 C)  SpO2:  100%   General: Somnolent, awakens briefly to painful stimuli CV:  Good peripheral perfusion. Regular rate and rhythm. Resp:  Normal effort.  Abd:  No distention.    ED Results / Procedures / Treatments   Labs (all labs ordered are listed, but only abnormal results are displayed) Labs Reviewed  COMPREHENSIVE METABOLIC PANEL - Abnormal; Notable for the following components:      Result Value   BUN 29 (*)    Creatinine, Ser 1.33 (*)    Calcium 8.8 (*)    GFR, Estimated 54 (*)    All other components within normal limits  CBC - Abnormal; Notable for the following components:   Platelets 148 (*)    All other  components within normal limits  URINALYSIS, ROUTINE W REFLEX MICROSCOPIC - Abnormal; Notable for the following components:   Color, Urine AMBER (*)    APPearance CLEAR (*)    Hgb urine dipstick SMALL (*)    All other components within normal limits  CULTURE, BLOOD (ROUTINE X 2)  CULTURE, BLOOD (ROUTINE X 2)  LACTIC ACID, PLASMA  LACTIC ACID, PLASMA  CBG MONITORING, ED     EKG  I, Phineas Semen, attending physician, personally viewed and interpreted this EKG  EKG Time: 0558 Rate: 71 Rhythm: sinus rhythm with 1st degree av block Axis: normal Intervals: qtc 435 QRS: narrow ST changes: no st elevation Impression: abnormal ekg    RADIOLOGY I independently interpreted and visualized the CT head/cervical spine. My interpretation: No ICH. No fracture. Radiology interpretation: IMPRESSION:  1. No evidence of significant acute traumatic injury to the skull,  brain or cervical spine.  2. Mild cerebral and cerebellar atrophy with chronic microvascular  ischemic changes in the cerebral white matter, as above.  3. Mild multilevel degenerative disc disease and cervical  spondylosis, as above.    I independently interpreted and visualized the CXR. My interpretation: No abnormality Radiology interpretation:  IMPRESSION:  1. Low lung volumes without radiographic evidence of acute  cardiopulmonary disease.  2. Aortic atherosclerosis.     PROCEDURES:  Critical Care performed: No   MEDICATIONS ORDERED IN ED: Medications - No data to display   IMPRESSION / MDM / ASSESSMENT AND PLAN / ED COURSE  I reviewed the triage vital signs and the nursing notes.                              Differential diagnosis includes, but is not limited to, infection, ICH, toxicity  Patient's presentation is most consistent with acute presentation with potential threat to life or bodily function.   The patient is on the cardiac monitor to evaluate for evidence of arrhythmia and/or  significant heart rate changes.  Patient presented to the emergency department today because of concerns for altered mental status.  On exam patient is quite somnolent and only responds minimally to painful stimuli.  Will initiate broad workup.  UA without signs of infection.  Chest x-ray without pneumonia.  Blood work without significant AKI or leukocytosis.  At this time somewhat unclear etiology of the patient's altered mental status.  However given continued altered mental status do think patient would benefit from further workup.  Discussed with Dr. Arville Care with the hospitalist service who will evaluate for admission.      FINAL CLINICAL IMPRESSION(S) / ED DIAGNOSES   Final diagnoses:  Altered mental status, unspecified altered mental status type     Note:  This document was prepared using Dragon voice recognition software and may include unintentional dictation errors.    Phineas Semen, MD 08/25/23 0630

## 2023-08-25 NOTE — ED Notes (Signed)
Patient cleaned up after an episode of urinary incontinence. Patient placed in a new brief and new paper pads placed under the patient. Patient's bed sheet changed also. Patient's bed lowered to lowest position. Call light within reach.

## 2023-08-25 NOTE — ED Notes (Signed)
Patient given oral tablets in applesauce. Tolerated well.

## 2023-08-25 NOTE — Evaluation (Signed)
Occupational Therapy Evaluation Patient Details Name: Joshua George MRN: [id number] DOB: 1940-10-15 Today's Date: 08/25/2023   History of Present Illness 82 y.o. male with medical history significant of Parkinson disease, dementia, BPH, brought in by family member for evaluation of delirium and hallucination.   Clinical Impression   Pt was seen for PT/OT co-evaluation this date to manage pt's cognition and maximize overall safety. Wife present to provide full history--Prior to hospital admission, pt was ambulating in the home with no AD most of the time and occasional use of SPC. IND with ADL peformance excluding FMC tasks. Lives in a 1 level home with ramp to enter with his wife who assists with bed mobility and IADLs.   Pt presents to acute OT demonstrating impaired ADL performance and functional mobility 2/2 weakness, confusion, balance deficits (See OT problem list for additional functional deficits). Pt currently requires Min A for bed mobility and CGA for STS from EOB. Mod A x2 for scooting back to [facility]. Pt able to state his name and that he is in a hospital. BP elevated with nurse recommending light activity for eval, further mobility deferred. Wife assisted with bed mobility and reports he is near his baseline with this. Remains lethargic.  Pt would benefit from acute skilled OT services to address noted impairments and functional limitations in order to maximize safety and independence while minimizing falls risk and caregiver burden. Do anticipate the need for follow up OT services upon acute hospital DC. Wife and pt prefer him to return home, but will consider STR if he is unable to walk.        If plan is discharge home, recommend the following: A lot of help with walking and/or transfers;A lot of help with bathing/dressing/bathroom;Direct supervision/assist for medications management;Supervision due to cognitive status;Direct supervision/assist for financial management;Assist for  transportation;Assistance with cooking/housework;Help with stairs or ramp for entrance    Functional Status Assessment  Patient has had a recent decline in their functional status and demonstrates the ability to make significant improvements in function in a reasonable and predictable amount of time.  Equipment Recommendations  None recommended by OT (possibly Wheelchair if goes home unable to walk?)    Recommendations for Other Services       Precautions / Restrictions Precautions Precautions: Fall Restrictions Weight Bearing Restrictions Per Provider Order: No      Mobility Bed Mobility Overal bed mobility: Needs Assistance Bed Mobility: Supine to Sit, Sit to Supine     Supine to sit: Min assist Sit to supine: Min assist   General bed mobility comments: wife demo bed mobility with pt to assess for acute deficits in strength/balance/abilities    Transfers Overall transfer level: Needs assistance Equipment used: None Transfers: Sit to/from Stand Sit to Stand: Supervision, From elevated surface           General transfer comment: inpulsively comes to standing during return to bed, no assist provided      Balance Overall balance assessment: History of Falls, Mild deficits observed, not formally tested                                         ADL either performed or assessed with clinical judgement   ADL Overall ADL's : Needs assistance/impaired  General ADL Comments: pt typically able to perform ADLs on his own excluding buttoning/zipping, tying shoes, etc. likely Max/Mod A at this time d/t lethargy     Vision         Perception         Praxis         Pertinent Vitals/Pain Pain Assessment Pain Assessment: No/denies pain     Extremity/Trunk Assessment Upper Extremity Assessment Upper Extremity Assessment: Generalized weakness   Lower Extremity Assessment Lower Extremity  Assessment: Generalized weakness       Communication Communication Cueing Techniques: Verbal cues   Cognition Arousal: Lethargic Behavior During Therapy: WFL for tasks assessed/performed Overall Cognitive Status: Difficult to assess                                 General Comments: stated his name and location to nurse prior to getting meds     General Comments  BPs elevated all day, nurse provided BP meds but did not help, MD to order more    Exercises Other Exercises Other Exercises: Wife provided all history and reports pt has been to SNF for rehab and had [facility] services and they are aware of purpose of therapy.   Shoulder Instructions      Home Living Family/patient expects to be discharged to:: Private residence Living Arrangements: Spouse/significant other Available Help at Discharge: Family;Available 24 hours/day Type of Home: House Home Access: Ramped entrance     Home Layout: One level     Bathroom Shower/Tub: Producer, television/film/video: Standard (both a standard and comfort height in home) Bathroom Accessibility: Yes   Home Equipment: Agricultural consultant (2 wheels);Cane - single point;Grab bars - toilet;Shower seat - built in;BSC/3in1;Hand held shower head          Prior Functioning/Environment Prior Level of Function : Needs assist             Mobility Comments: mostly household AMB at supervision level, sometimes limited community distances with device (needs reminders) ADLs Comments: assist with fine motor componenets of dressing, able to bathe in shower with SUP from wife        OT Problem List: Decreased strength;Decreased coordination;Decreased cognition;Impaired balance (sitting and/or standing);Decreased safety awareness      OT Treatment/Interventions: Self-care/ADL training;Therapeutic exercise;Therapeutic activities;DME and/or AE instruction;Patient/family education;Balance training    OT Goals(Current goals can be found  in the care plan section) Acute Rehab OT Goals Patient Stated Goal: return home OT Goal Formulation: With patient Time For Goal Achievement: 09/08/23 Potential to Achieve Goals: Good ADL Goals Pt Will Perform Grooming: with set-up;sitting Pt Will Perform Upper Body Bathing: sitting;with supervision Pt Will Perform Lower Body Dressing: sitting/lateral leans;sit to/from stand;with contact guard assist Pt Will Transfer to Toilet: with contact guard assist;ambulating;regular height toilet Pt Will Perform Toileting - Clothing Manipulation and hygiene: with contact guard assist;sit to/from stand;sitting/lateral leans  OT Frequency: Min 1X/week    Co-evaluation PT/OT/SLP Co-Evaluation/Treatment: Yes Reason for Co-Treatment: Complexity of the patient's impairments (multi-system involvement) PT goals addressed during session: Mobility/safety with mobility;Balance OT goals addressed during session: ADL's and self-care;Proper use of Adaptive equipment and DME      AM-PAC OT "6 Clicks" Daily Activity     Outcome Measure Help from another person eating meals?: A Lot Help from another person taking care of personal grooming?: A Lot Help from another person toileting, which includes using toliet, bedpan, or urinal?: A Lot  Help from another person bathing (including washing, rinsing, drying)?: A Lot Help from another person to put on and taking off regular upper body clothing?: A Lot Help from another person to put on and taking off regular lower body clothing?: A Lot 6 Click Score: 12   End of Session    Activity Tolerance: Patient limited by lethargy;Treatment limited secondary to medical complications (Comment) (elevated BP) Patient left: in bed;with call bell/phone within reach;with family/visitor present  OT Visit Diagnosis: Other abnormalities of gait and mobility (R26.89);Unsteadiness on feet (R26.81);Muscle weakness (generalized) (M62.81);History of falling (Z91.81)                Time:  1610-9604 OT Time Calculation (min): 18 min Charges:  OT General Charges $OT Visit: 1 Visit OT Evaluation $OT Eval Low Complexity: 1 Low Camelle Henkels, OTR/L  08/25/23, 3:32 PM  Willaim Mode E Jannetta Massey 08/25/2023, 3:28 PM

## 2023-08-25 NOTE — ED Notes (Signed)
Patient to MRI. MRI to take patient to EEG afterwards.
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
## 2023-08-25 NOTE — Progress Notes (Signed)
Family reported patient developed diffuse rash on his back and flanks.  I went to see the patient patient has macular rash on bilateral flanks and back.  Suspected scratch markers on his chest as well.  Patient denied any itchiness.  Reviewed patient's MAR that patient received 1 dose of as needed hydralazine  for blood pressure control and Lovenox this morning.  Confirmed with family that patient does not have any history of drug allergies.  Will change hydralazine to labetalol as needed for blood pressure control.

## 2023-08-25 NOTE — Procedures (Signed)
RN informed me pt is going to MRI first-will do EEG after MRI.

## 2023-08-25 NOTE — ED Notes (Signed)
PT and OT at bedside

## 2023-08-25 NOTE — ED Notes (Signed)
Unknown last known well: ems reports recently found on the ground by wife before she called ems.

## 2023-08-25 NOTE — ED Notes (Signed)
In and out cath done, brief and linens changed

## 2023-08-25 NOTE — Progress Notes (Signed)
Eeg done 

## 2023-08-25 NOTE — ED Notes (Signed)
This RN made MD Zhang aware that patient is unable to take oral pills at this time. Patient currently only responsive to painful stimuli. Patient has incomprehensible speech. MD states the meds will be moved to a later time.

## 2023-08-25 NOTE — ED Notes (Signed)
Patient back from MRI and EEG. This RN and Diplomatic Services operational officer noted patient to have a red non-raised rash on his upper extremity. MD Zhang notified.

## 2023-08-25 NOTE — ED Notes (Signed)
MD Zhang at bedside

## 2023-08-25 NOTE — ED Notes (Signed)
MD made aware that patient's BP is increasing.

## 2023-08-26 ENCOUNTER — Other Ambulatory Visit: Payer: Self-pay

## 2023-08-26 ENCOUNTER — Encounter: Payer: Self-pay | Admitting: Internal Medicine

## 2023-08-26 DIAGNOSIS — R410 Disorientation, unspecified: Secondary | ICD-10-CM | POA: Diagnosis not present

## 2023-08-26 LAB — T4, FREE: Free T4: 0.85 ng/dL (ref 0.61–1.12)

## 2023-08-26 LAB — BASIC METABOLIC PANEL
Anion gap: 9 (ref 5–15)
BUN: 22 mg/dL (ref 8–23)
CO2: 26 mmol/L (ref 22–32)
Calcium: 8.9 mg/dL (ref 8.9–10.3)
Chloride: 103 mmol/L (ref 98–111)
Creatinine, Ser: 1.12 mg/dL (ref 0.61–1.24)
GFR, Estimated: >60 mL/min (ref >60)
Glucose, Bld: 99 mg/dL (ref 70–99)
Potassium: 4.8 mmol/L (ref 3.5–5.1)
Sodium: 138 mmol/L (ref 135–145)

## 2023-08-26 NOTE — Plan of Care (Signed)
  Problem: Education: Goal: Knowledge of General Education information will improve Description: Including pain rating scale, medication(s)/side effects and non-pharmacologic comfort measures Outcome: Adequate for Discharge   Problem: Health Behavior/Discharge Planning: Goal: Ability to manage health-related needs will improve Outcome: Adequate for Discharge   Problem: Clinical Measurements: Goal: Ability to maintain clinical measurements within normal limits will improve Outcome: Adequate for Discharge Goal: Will remain free from infection Outcome: Adequate for Discharge Goal: Diagnostic test results will improve Outcome: Adequate for Discharge Goal: Respiratory complications will improve Outcome: Adequate for Discharge Goal: Cardiovascular complication will be avoided Outcome: Adequate for Discharge   Problem: Activity: Goal: Risk for activity intolerance will decrease Outcome: Adequate for Discharge   Problem: Nutrition: Goal: Adequate nutrition will be maintained Outcome: Adequate for Discharge   Problem: Coping: Goal: Level of anxiety will decrease Outcome: Adequate for Discharge   Problem: Elimination: Goal: Will not experience complications related to bowel motility Outcome: Adequate for Discharge Goal: Will not experience complications related to urinary retention Outcome: Adequate for Discharge   Problem: Pain Management: Goal: General experience of comfort will improve Outcome: Adequate for Discharge   Problem: Safety: Goal: Ability to remain free from injury will improve Outcome: Adequate for Discharge   Problem: Skin Integrity: Goal: Risk for impaired skin integrity will decrease Outcome: Adequate for Discharge  Pt has all belongings. Pt has received all dc instructions. Pt being taken home by wife. Pt taken down to lobby with staff via wheelchair

## 2023-08-26 NOTE — Discharge Summary (Signed)
Physician Discharge Summary   Patient: Joshua George MRN: [id number] DOB: [birth year]  Admit date:     08/25/2023  Discharge date: 08/26/23  Discharge Physician: Lurene Shadow   PCP: Kandyce Rud, MD   Recommendations at discharge:   Follow-up with PCP in 1 week  Discharge Diagnoses: Principal Problem:   Altered mental status Active Problems:   Parkinson's disease (HCC)   Delirium   AMS (altered mental status)  Resolved Problems:   * No resolved hospital problems. *  Hospital Course:  Joshua George is a 82 y.o. male with medical history significant of Parkinson disease with visual hallucinations diagnosed 14 years ago, dementia, BPH, who was brought to the hospital because of altered mental status and hallucinations.  Reportedly, he had gotten up in the middle of the night to use the bathroom.  His wife later found him on the floor of the bathroom.  His wife thought that he had UTI because he usually gets confused when he has a UTI.  He was admitted to the hospital for delirium with visual hallucinations.  He was also dehydrated and had hypertensive urgency.  He was treated with IV fluids.  MRI brain did not show any acute stroke.  EEG did not show any epileptiform activity.  Delirium and dehydration have resolved.  Of note, patient developed a rash on his back and flanks but this has resolved.  Patient is back to his baseline and he is deemed stable for discharge to home today.  PT recommended home health therapy in a wheelchair.  Discharge plan was discussed with the patient and his wife at the bedside.       Consultants: None Procedures performed: None Disposition: Home health Diet recommendation:  Discharge Diet Orders (From admission, onward)     Start     Ordered   08/26/23 0000  Diet - low sodium heart healthy        08/26/23 1023           Cardiac diet DISCHARGE MEDICATION: Allergies as of 08/26/2023   No Known Allergies      Medication List     STOP  taking these medications    clonazePAM 1 MG tablet Commonly known as: KLONOPIN       TAKE these medications    acetaminophen 325 MG tablet Commonly known as: TYLENOL Take 325 mg by mouth every 6 (six) hours as needed for mild pain.   carbidopa-levodopa-entacapone 37.5-150-200 MG tablet Commonly known as: STALEVO Take 1 tablet by mouth 4 (four) times daily.   Cranberry 400 MG Caps Take by mouth.   finasteride 5 MG tablet Commonly known as: PROSCAR Take 1 tablet (5 mg total) by mouth daily.   Gocovri 137 MG Cp24 Generic drug: Amantadine HCl ER Take 1 capsule by mouth at bedtime.   melatonin 5 MG Tabs Take 1 tablet (5 mg total) by mouth at bedtime as needed.   omeprazole 20 MG capsule Commonly known as: PRILOSEC Take 1 capsule by mouth at bedtime.   polyethylene glycol powder 17 GM/SCOOP powder Commonly known as: GLYCOLAX/MIRALAX Take by mouth.   QUEtiapine 25 MG tablet Commonly known as: SEROQUEL TAKE 1 TABLET(25 MG) BY MOUTH EVERY NIGHT   rasagiline 1 MG Tabs tablet Commonly known as: AZILECT Take 1 mg by mouth daily.   tamsulosin 0.4 MG Caps capsule Commonly known as: FLOMAX TAKE 1 CAPSULE(0.4 MG) BY MOUTH DAILY WITH LUNCH        Discharge Exam: Filed Weights   08/25/23 0425  Weight: 62.6 kg   GEN: NAD SKIN: Warm and dry EYES: No pallor or icterus ENT: MMM CV: RRR PULM: CTA B ABD: soft, ND, NT, +BS CNS: AAO x 3, non focal EXT: No edema or tenderness   Condition at discharge: good  The results of significant diagnostics from this hospitalization (including imaging, microbiology, ancillary and laboratory) are listed below for reference.   Imaging Studies: EEG adult Result Date: 08/25/2023 Charlsie Quest, MD     08/25/2023  2:19 PM Patient Name: Joshua George MRN: [id number] Epilepsy Attending: Charlsie Quest Referring Physician/Provider: Emeline General, MD Date: 08/25/2023 Duration: 27.50 mins Patient history: 82 yo M with ams getting eeg  to evaluate for seizure Level of alertness: Awake, asleep AEDs during EEG study: None Technical aspects: This EEG study was done with scalp electrodes positioned according to the 10-20 International system of electrode placement. Electrical activity was reviewed with band pass filter of 1-70Hz , sensitivity of 7 uV/mm, display speed of 97mm/sec with a 60Hz  notched filter applied as appropriate. EEG data were recorded continuously and digitally stored.  Video monitoring was available and reviewed as appropriate. Description: The posterior dominant rhythm consists of 7 Hz activity of moderate voltage (25-35 uV) seen predominantly in posterior head regions, symmetric and reactive to eye opening and eye closing. Sleep was characterized by vertex waves, sleep spindles (12 to 14 Hz), maximal frontocentral region. EEG showed continuous generalized 5 to 7 Hz theta slowing. Hyperventilation and photic stimulation were not performed.   ABNORMALITY - Continuous slow, generalized IMPRESSION: This study is suggestive of mild severe diffuse encephalopathy. No seizures or epileptiform discharges were seen throughout the recording. Charlsie Quest   MR BRAIN WO CONTRAST Result Date: 08/25/2023 CLINICAL DATA:  Delirium. EXAM: MRI HEAD WITHOUT CONTRAST TECHNIQUE: Multiplanar, multiecho pulse sequences of the brain and surrounding structures were obtained without intravenous contrast. COMPARISON:  Head CT 08/25/2023 and MRI 09/25/2017 FINDINGS: Brain: There is no evidence of an acute infarct, intracranial hemorrhage, mass, midline shift, or extra-axial fluid collection. Patchy T2 hyperintensities in the cerebral white matter have mildly progressed from the 2019 MRI and are nonspecific but compatible with mild-to-moderate chronic small vessel ischemic disease. There is mild generalized cerebral atrophy. Vascular: Major intracranial vascular flow voids are preserved. Skull and upper cervical spine: Unremarkable bone marrow signal.  Sinuses/Orbits: Unremarkable orbits. Paranasal sinuses and mastoid air cells are clear. Other: Slightly increased size of a right parietal scalp lipoma since 2019. IMPRESSION: 1. No acute intracranial abnormality. 2. Mild-to-moderate chronic small vessel ischemic disease. Electronically Signed   By: Sebastian Ache M.D.   On: 08/25/2023 13:05   DG Chest Portable 1 View Result Date: 08/25/2023 CLINICAL DATA:  82 year old male with history of altered mental status. EXAM: PORTABLE CHEST 1 VIEW COMPARISON:  Chest x-ray 10/22/2020. FINDINGS: Lung volumes are low. No consolidative airspace disease. No pleural effusions. No pneumothorax. No pulmonary nodule or mass noted. Pulmonary vasculature and the cardiomediastinal silhouette are within normal limits. Atherosclerotic calcifications in the thoracic aorta. IMPRESSION: 1. Low lung volumes without radiographic evidence of acute cardiopulmonary disease. 2. Aortic atherosclerosis. Electronically Signed   By: Trudie Reed M.D.   On: 08/25/2023 05:53   CT Head Wo Contrast Result Date: 08/25/2023 CLINICAL DATA:  82 year old male with history of trauma from a fall. Altered mental status. EXAM: CT HEAD WITHOUT CONTRAST CT CERVICAL SPINE WITHOUT CONTRAST TECHNIQUE: Multidetector CT imaging of the head and cervical spine was performed following the standard protocol without intravenous contrast. Multiplanar CT  image reconstructions of the cervical spine were also generated. RADIATION DOSE REDUCTION: This exam was performed according to the departmental dose-optimization program which includes automated exposure control, adjustment of the mA and/or kV according to patient size and/or use of iterative reconstruction technique. COMPARISON:  CT head and cervical spine 12/29/2020. FINDINGS: CT HEAD FINDINGS Brain: Mild cerebral and cerebellar atrophy. Patchy and confluent areas of decreased attenuation are noted throughout the deep and periventricular white matter of the  cerebral hemispheres bilaterally, compatible with chronic microvascular ischemic disease. No evidence of acute infarction, hemorrhage, hydrocephalus, extra-axial collection or mass lesion/mass effect. Vascular: No hyperdense vessel or unexpected calcification. Skull: Normal. Negative for fracture or focal lesion. Sinuses/Orbits: No acute finding. Other: None. CT CERVICAL SPINE FINDINGS Alignment: Normal. Skull base and vertebrae: No acute fracture. No primary bone lesion or focal pathologic process. Soft tissues and spinal canal: No prevertebral fluid or swelling. No visible canal hematoma. Disc levels: Multilevel degenerative disc disease, most severe at C5-C6 and C6-C7. Upper chest: Mild scarring in the apex of the left hemithorax. Other: None. IMPRESSION: 1. No evidence of significant acute traumatic injury to the skull, brain or cervical spine. 2. Mild cerebral and cerebellar atrophy with chronic microvascular ischemic changes in the cerebral white matter, as above. 3. Mild multilevel degenerative disc disease and cervical spondylosis, as above. Electronically Signed   By: Trudie Reed M.D.   On: 08/25/2023 05:13   CT Cervical Spine Wo Contrast Result Date: 08/25/2023 CLINICAL DATA:  82 year old male with history of trauma from a fall. Altered mental status. EXAM: CT HEAD WITHOUT CONTRAST CT CERVICAL SPINE WITHOUT CONTRAST TECHNIQUE: Multidetector CT imaging of the head and cervical spine was performed following the standard protocol without intravenous contrast. Multiplanar CT image reconstructions of the cervical spine were also generated. RADIATION DOSE REDUCTION: This exam was performed according to the departmental dose-optimization program which includes automated exposure control, adjustment of the mA and/or kV according to patient size and/or use of iterative reconstruction technique. COMPARISON:  CT head and cervical spine 12/29/2020. FINDINGS: CT HEAD FINDINGS Brain: Mild cerebral and  cerebellar atrophy. Patchy and confluent areas of decreased attenuation are noted throughout the deep and periventricular white matter of the cerebral hemispheres bilaterally, compatible with chronic microvascular ischemic disease. No evidence of acute infarction, hemorrhage, hydrocephalus, extra-axial collection or mass lesion/mass effect. Vascular: No hyperdense vessel or unexpected calcification. Skull: Normal. Negative for fracture or focal lesion. Sinuses/Orbits: No acute finding. Other: None. CT CERVICAL SPINE FINDINGS Alignment: Normal. Skull base and vertebrae: No acute fracture. No primary bone lesion or focal pathologic process. Soft tissues and spinal canal: No prevertebral fluid or swelling. No visible canal hematoma. Disc levels: Multilevel degenerative disc disease, most severe at C5-C6 and C6-C7. Upper chest: Mild scarring in the apex of the left hemithorax. Other: None. IMPRESSION: 1. No evidence of significant acute traumatic injury to the skull, brain or cervical spine. 2. Mild cerebral and cerebellar atrophy with chronic microvascular ischemic changes in the cerebral white matter, as above. 3. Mild multilevel degenerative disc disease and cervical spondylosis, as above. Electronically Signed   By: Trudie Reed M.D.   On: 08/25/2023 05:13    Microbiology: Results for orders placed or performed during the hospital encounter of 08/25/23  Blood culture (routine x 2)     Status: None (Preliminary result)   Collection Time: 08/25/23  4:11 AM   Specimen: BLOOD  Result Value Ref Range Status   Specimen Description BLOOD LEFT ASSIST CONTROL  Final  Special Requests   Final    BOTTLES DRAWN AEROBIC AND ANAEROBIC Blood Culture adequate volume   Culture   Final    NO GROWTH 1 DAY Performed at [facility], [address] [place], [place] [address]    Report Status PENDING  Incomplete  Blood culture (routine x 2)     Status: None (Preliminary result)   Collection Time:  08/25/23  4:11 AM   Specimen: BLOOD  Result Value Ref Range Status   Specimen Description BLOOD RIGHT WRIST  Final   Special Requests   Final    BOTTLES DRAWN AEROBIC AND ANAEROBIC Blood Culture results may not be optimal due to an inadequate volume of blood received in culture bottles   Culture   Final    NO GROWTH < 24 HOURS Performed at [facility], [address] [place], [place] [address]    Report Status PENDING  Incomplete    Labs: CBC: Recent Labs  Lab 08/25/23 0410  WBC 5.4  HGB 13.4  HCT 40.5  MCV 90.6  PLT 148*   Basic Metabolic Panel: Recent Labs  Lab 08/25/23 0410 08/26/23 0629  NA 138 138  K 4.3 4.8  CL 102 103  CO2 29 26  GLUCOSE 99 99  BUN 29* 22  CREATININE 1.33* 1.12  CALCIUM 8.8* 8.9   Liver Function Tests: Recent Labs  Lab 08/25/23 0410  AST 23  ALT 6  ALKPHOS 73  BILITOT 0.9  PROT 7.2  ALBUMIN 4.2   CBG: No results for input(s): "GLUCAP" in the last 168 hours.  Discharge time spent: greater than 30 minutes.  Signed: Lurene Shadow, MD [facility] 08/26/2023

## 2023-08-26 NOTE — TOC Initial Note (Signed)
Transition of Care [facility]) - Initial/Assessment Note    Patient Details  Name: Joshua George MRN: [id number] Date of Birth: 1940-11-10  Transition of Care [facility]) CM/SW Contact:    Liliana Cline, LCSW Phone Number: 08/26/2023, 11:57 AM  Clinical Narrative:                 CSW spoke with patient's spouse at bedside. Patient is from home with spouse who provides transport. PCP is Dr. Larwance Sachs. Pharmacy is [facility]. Patient has a RW, cane, and grab bars at home. Patient and spouse agreeable to Home Health and wheelchair recs. They request a light weight transport wheelchair. They would like home delivery and are aware it may be 2 days before it is delivered to the home. They request [facility] for Home Health agency. Referral made to [facility] with [facility] for transport wheelchair to be delivered to the home, notified Selena Batten of DC today. Referral made to [facility] with [facility] for Home Health PT and OT, notified Shakita of DC home today.    Expected Discharge Plan: Home w Home Health Services Barriers to Discharge: Barriers Resolved   Patient Goals and CMS Choice Patient states their goals for this hospitalization and ongoing recovery are:: home with home health CMS Medicare.gov Compare Post Acute Care list provided to:: Patient Represenative (must comment) Choice offered to / list presented to : Spouse      Expected Discharge Plan and Services       Living arrangements for the past 2 months: Single Family Home Expected Discharge Date: 08/26/23               DME Arranged: High strength lightweight manual wheelchair with seat cushion DME Agency: [facility] Date DME Agency Contacted: 08/26/23     HH Arranged: PT, OT HH Agency: [facility] Date HH Agency Contacted: 08/26/23   Representative spoke with at [facility] Agency: [facility]  Prior Living Arrangements/Services Living arrangements for the past 2 months: Single Family Home Lives with:: Spouse Patient language and need for  interpreter reviewed:: Yes Do you feel safe going back to the place where you live?: Yes      Need for Family Participation in Patient Care: Yes (Comment) Care giver support system in place?: Yes (comment)   Criminal Activity/Legal Involvement Pertinent to Current Situation/Hospitalization: No - Comment as needed  Activities of Daily Living   ADL Screening (condition at time of admission) Independently performs ADLs?: No Does the patient have a NEW difficulty with bathing/dressing/toileting/self-feeding that is expected to last >3 days?: Yes (Initiates electronic notice to provider for possible OT consult) Does the patient have a NEW difficulty with getting in/out of bed, walking, or climbing stairs that is expected to last >3 days?: Yes (Initiates electronic notice to provider for possible PT consult) Does the patient have a NEW difficulty with communication that is expected to last >3 days?: No Is the patient deaf or have difficulty hearing?: No Does the patient have difficulty seeing, even when wearing glasses/contacts?: No Does the patient have difficulty concentrating, remembering, or making decisions?: Yes  Permission Sought/Granted Permission sought to share information with : Facility Industrial/product designer granted to share information with : Yes, Verbal Permission Granted     Permission granted to share info w AGENCY: [facility], [facility]        Emotional Assessment         Alcohol / Substance Use: Not Applicable Psych Involvement: No (comment)  Admission diagnosis:  Altered mental status [R41.82]  Altered mental status, unspecified altered mental status type [R41.82] AMS (altered mental status) [R41.82] Patient Active Problem List   Diagnosis Date Noted   Altered mental status 08/25/2023   Delirium 08/25/2023   AMS (altered mental status) 08/25/2023   Protein-calorie malnutrition, severe 10/26/2020   AKI (acute kidney injury) (HCC) 10/20/2020   Hyponatremia  10/20/2020   CAP (community acquired pneumonia) 10/20/2020   Parapneumonic effusion 10/20/2020   Parkinson's disease (HCC)    GERD (gastroesophageal reflux disease)    PCP:  Kandyce Rud, MD Pharmacy:   [facility] [phone] - [place], [place] - [address] AT [facility] [address] [place] [address] Phone: [phone] Fax: [phone]     Social Drivers of Health (SDOH) Social History: SDOH Screenings   Food Insecurity: No Food Insecurity (08/26/2023)  Housing: Low Risk  (08/26/2023)  Transportation Needs: No Transportation Needs (08/26/2023)  Utilities: Not At Risk (08/26/2023)  Financial Resource Strain: Patient Declined (04/13/2023)   Received from [facility]  Tobacco Use: Low Risk  (08/26/2023)   SDOH Interventions:     Readmission Risk Interventions     No data to display

## 2023-08-26 NOTE — Care Management CC44 (Signed)
Condition Code 44 Documentation Completed  Patient Details  Name: Joshua George MRN: [id number] Date of Birth: [birth year]   Condition Code 44 given:  Yes Patient signature on Condition Code 44 notice:  Yes Documentation of 2 MD's agreement:  Yes Code 44 added to claim:  Yes  Reviewed with patient and spouse at bedside. Copy provided to them for their records.  Roniesha Hollingshead E [name], LCSW 08/26/2023, 12:00 PM

## 2023-08-26 NOTE — Progress Notes (Addendum)
PR LIGHTWEIGHT WHEELCHAIR (Order [id number]) PR Charge Date: 08/26/2023 Department: [facility] GENERAL SURGERY Ordering/Authorizing: Lurene Shadow, MD  Transport wheelchair

## 2023-08-27 LAB — T3, FREE: T3, Free: 4.1 pg/mL (ref 2.0–4.4)

## 2023-08-28 LAB — GLUCOSE, CAPILLARY: Glucose-Capillary: 90 mg/dL (ref 70–99)

## 2023-08-30 LAB — CULTURE, BLOOD (ROUTINE X 2)
Culture: NO GROWTH
Culture: NO GROWTH
Special Requests: ADEQUATE

## 2023-11-27 NOTE — Progress Notes (Unsigned)
 11/28/23 1:54 PM   Joshua George [birth year] [id number]  Referring provider:  Kandyce Rud, MD [address] [facility] [place],  [place] [address]  Urological history 1. Urinary retention -incidental finding of retention after presenting to the ED for delirium -1800 mL in the bladder -cysto 2022  Prominent lateral lobe enlargement prostate - Moderate median lobe - Moderate trabeculation   2. BPH with retention -PSA 3.39 in 03/2021 -RUS (2024) prostate volume 76 cc -cysto (04/2023) -prominent lateral lobe enlargement, elevated bladder neck, moderate median lobe and mild to moderate trabeculation -tamsulosin 0.4 mg daily  3. Renal mass -RUS (05/2022) - 11 mm hypoechoic mass exophytic off the midpole of the right kidney not visualized on comparison studies. This mass could represent a complex/complicated cyst versus a solid mass.  Given the history of hematuria, recommend MRI of the abdomen with and without contrast for complete characterization. If MRI is not pursued, recommend a six-month follow-up ultrasound to ensure stability -RUS (11/2022) - The bladder is poorly distended limiting evaluation. Possible mild wall thickening measuring up to 5 mm.  The prostate demonstrates a 76 cc volume which is enlarged. The kidneys are unremarkable.  4. High risk hematuria -non smoker -RUS (11/2022) - no worrisome findings -cysto (04/2023) - prostate hypervascularity   Chief Complaint  Patient presents with   Follow-up    HPI: Joshua George is a 83 y.o.male who returns for six month follow up with his wife, [name].   Previous records reviewed.   He was admitted in December for delirium secondary to his Parkinson.  No UTI.  There was microscopic hematuria on his UA sample while in the hospital.  He is feeling well today.  Patient denies any modifying or aggravating factors.  Patient denies any recent UTI's, gross hematuria, dysuria or  suprapubic/flank pain.  Patient denies any fevers, chills, nausea or vomiting.    PVR 0 mL   PMH: Past Medical History:  Diagnosis Date   BPH (benign prostatic hyperplasia)    Dementia (HCC)    GERD (gastroesophageal reflux disease)    Parkinson's disease (HCC)     Surgical History: Past Surgical History:  Procedure Laterality Date   TONSILLECTOMY      Home Medications:  Allergies as of 11/28/2023   No Known Allergies      Medication List        Accurate as of November 28, 2023  1:54 PM. If you have any questions, ask your nurse or doctor.          acetaminophen 325 MG tablet Commonly known as: TYLENOL Take 325 mg by mouth every 6 (six) hours as needed for mild pain.   carbidopa-levodopa-entacapone 37.5-150-200 MG tablet Commonly known as: STALEVO Take 1 tablet by mouth 4 (four) times daily.   clonazePAM 1 MG tablet Commonly known as: KLONOPIN Take 1 mg by mouth at bedtime as needed.   Cranberry 400 MG Caps Take by mouth.   finasteride 5 MG tablet Commonly known as: PROSCAR Take 1 tablet (5 mg total) by mouth daily.   Gocovri 137 MG Cp24 Generic drug: Amantadine HCl ER Take 1 capsule by mouth at bedtime.   melatonin 5 MG Tabs Take 1 tablet (5 mg total) by mouth at bedtime as needed.   omeprazole 20 MG capsule Commonly known as: PRILOSEC Take 1 capsule by mouth at bedtime.   polyethylene glycol powder 17 GM/SCOOP powder Commonly known as: GLYCOLAX/MIRALAX Take by mouth.   QUEtiapine  25 MG tablet Commonly known as: SEROQUEL TAKE 1 TABLET(25 MG) BY MOUTH EVERY NIGHT   rasagiline 1 MG Tabs tablet Commonly known as: AZILECT Take 1 mg by mouth daily.   tamsulosin 0.4 MG Caps capsule Commonly known as: FLOMAX TAKE 1 CAPSULE(0.4 MG) BY MOUTH DAILY WITH LUNCH        Allergies:  No Known Allergies  Family History: History reviewed. No pertinent family history.  Social History:  reports that he has never smoked. He has never used smokeless  tobacco. He reports that he does not currently use alcohol. No history on file for drug use.   Physical Exam: BP (!) 172/81   Pulse 60   Ht 5\' 8"  (1.727 m)   Wt 125 lb (56.7 kg)   BMI 19.01 kg/m   Constitutional:  Well nourished. Alert and oriented, No acute distress. HEENT: [place] AT, moist mucus membranes.  Trachea midline Cardiovascular: No clubbing, cyanosis, or edema. Respiratory: Normal respiratory effort, no increased work of breathing. Neurologic: Grossly intact, no focal deficits, moving all 4 extremities. Psychiatric: Normal mood and affect.   Laboratory Data: BMET    Component Value Date/Time   NA 138 08/26/2023 0629   K 4.8 08/26/2023 0629   CL 103 08/26/2023 0629   CO2 26 08/26/2023 0629   GLUCOSE 99 08/26/2023 0629   BUN 22 08/26/2023 0629   CREATININE 1.12 08/26/2023 0629   CALCIUM 8.9 08/26/2023 0629   GFRNONAA >60 08/26/2023 0629    CBC    Component Value Date/Time   WBC 5.4 08/25/2023 0410   RBC 4.47 08/25/2023 0410   HGB 13.4 08/25/2023 0410   HCT 40.5 08/25/2023 0410   PLT 148 (L) 08/25/2023 0410   MCV 90.6 08/25/2023 0410   MCH 30.0 08/25/2023 0410   MCHC 33.1 08/25/2023 0410   RDW 12.8 08/25/2023 0410   LYMPHSABS 0.4 (L) 10/20/2020 0848   MONOABS 1.6 (H) 10/20/2020 0848   EOSABS 0.1 10/20/2020 0848   BASOSABS 0.0 10/20/2020 0848  I have reviewed the labs.  See HPI.      Pertinent Imaging:  11/28/23 13:43  Scan Result 0ml    Assessment/plan:  1. BPH -voiding well -Continue tamsulosin 0.4 mg daily -continue finasteride 5 mg daily  2. High risk hematuria -non-smoker -recent RUS and cysto w/o worrisome findings -no reports of gross heme -continue finasteride as hematuria may be the result of BPH  Return in about 1 year (around 11/27/2024) for PVR .  Cloretta Ned   [facility] [address], [address] [place], [place] [address] [phone]

## 2023-11-28 ENCOUNTER — Ambulatory Visit: Payer: Self-pay | Admitting: Urology

## 2023-11-28 ENCOUNTER — Encounter: Payer: Self-pay | Admitting: Urology

## 2023-11-28 VITALS — BP 172/81 | HR 60 | Ht 68.0 in | Wt 125.0 lb

## 2023-11-28 DIAGNOSIS — N138 Other obstructive and reflux uropathy: Secondary | ICD-10-CM

## 2023-11-28 DIAGNOSIS — R319 Hematuria, unspecified: Secondary | ICD-10-CM

## 2023-11-28 DIAGNOSIS — N401 Enlarged prostate with lower urinary tract symptoms: Secondary | ICD-10-CM | POA: Diagnosis not present

## 2023-11-28 LAB — BLADDER SCAN AMB NON-IMAGING

## 2024-05-24 ENCOUNTER — Other Ambulatory Visit: Payer: Self-pay

## 2024-05-24 DIAGNOSIS — N138 Other obstructive and reflux uropathy: Secondary | ICD-10-CM

## 2024-05-24 MED ORDER — FINASTERIDE 5 MG PO TABS
5.0000 mg | ORAL_TABLET | Freq: Every day | ORAL | 3 refills | Status: AC
Start: 2024-05-24 — End: ?

## 2024-08-05 ENCOUNTER — Other Ambulatory Visit: Payer: Self-pay

## 2024-08-05 DIAGNOSIS — N401 Enlarged prostate with lower urinary tract symptoms: Secondary | ICD-10-CM

## 2024-08-05 MED ORDER — TAMSULOSIN HCL 0.4 MG PO CAPS: 0.4000 mg | ORAL_CAPSULE | Freq: Every day | ORAL | 3 refills | Status: AC

## 2024-11-27 ENCOUNTER — Ambulatory Visit: Admitting: Urology
